# Patient Record
Sex: Female | Born: 1985 | Race: White | Hispanic: No | Marital: Married | Smoking: Never smoker
Health system: Southern US, Community
[De-identification: ages and names within clinical notes are randomized; demographics above are authoritative.]

## PROBLEM LIST (undated history)

## (undated) DIAGNOSIS — K319 Disease of stomach and duodenum, unspecified: Secondary | ICD-10-CM

## (undated) DIAGNOSIS — F32A Depression, unspecified: Secondary | ICD-10-CM

## (undated) DIAGNOSIS — Z803 Family history of malignant neoplasm of breast: Secondary | ICD-10-CM

## (undated) DIAGNOSIS — N83209 Unspecified ovarian cyst, unspecified side: Secondary | ICD-10-CM

## (undated) DIAGNOSIS — F419 Anxiety disorder, unspecified: Secondary | ICD-10-CM

## (undated) DIAGNOSIS — F329 Major depressive disorder, single episode, unspecified: Secondary | ICD-10-CM

## (undated) DIAGNOSIS — Z8042 Family history of malignant neoplasm of prostate: Secondary | ICD-10-CM

## (undated) DIAGNOSIS — N979 Female infertility, unspecified: Secondary | ICD-10-CM

## (undated) DIAGNOSIS — E282 Polycystic ovarian syndrome: Secondary | ICD-10-CM

## (undated) DIAGNOSIS — G43909 Migraine, unspecified, not intractable, without status migrainosus: Secondary | ICD-10-CM

## (undated) HISTORY — PX: GALLBLADDER SURGERY: SHX652

## (undated) HISTORY — DX: Family history of malignant neoplasm of prostate: Z80.42

## (undated) HISTORY — DX: Major depressive disorder, single episode, unspecified: F32.9

## (undated) HISTORY — DX: Family history of malignant neoplasm of breast: Z80.3

## (undated) HISTORY — PX: WISDOM TOOTH EXTRACTION: SHX21

## (undated) HISTORY — DX: Female infertility, unspecified: N97.9

## (undated) HISTORY — DX: Disease of stomach and duodenum, unspecified: K31.9

## (undated) HISTORY — DX: Depression, unspecified: F32.A

## (undated) HISTORY — DX: Migraine, unspecified, not intractable, without status migrainosus: G43.909

---
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
## 2006-01-12 ENCOUNTER — Emergency Department (HOSPITAL_COMMUNITY): Admission: EM | Admit: 2006-01-12 | Discharge: 2006-01-12 | Payer: Self-pay | Admitting: Emergency Medicine

## 2006-02-23 ENCOUNTER — Encounter: Admission: RE | Admit: 2006-02-23 | Discharge: 2006-02-23 | Payer: Self-pay | Admitting: Gastroenterology

## 2006-03-06 ENCOUNTER — Encounter: Admission: RE | Admit: 2006-03-06 | Discharge: 2006-03-06 | Payer: Self-pay | Admitting: Gastroenterology

## 2006-04-26 ENCOUNTER — Encounter: Admission: RE | Admit: 2006-04-26 | Discharge: 2006-05-15 | Payer: Self-pay | Admitting: Family Medicine

## 2006-08-24 ENCOUNTER — Other Ambulatory Visit: Admission: RE | Admit: 2006-08-24 | Discharge: 2006-08-24 | Payer: Self-pay | Admitting: Obstetrics and Gynecology

## 2007-09-20 ENCOUNTER — Other Ambulatory Visit: Admission: RE | Admit: 2007-09-20 | Discharge: 2007-09-20 | Payer: Self-pay | Admitting: Obstetrics and Gynecology

## 2007-11-01 ENCOUNTER — Emergency Department (HOSPITAL_COMMUNITY): Admission: EM | Admit: 2007-11-01 | Discharge: 2007-11-02 | Payer: Self-pay | Admitting: Emergency Medicine

## 2007-11-02 ENCOUNTER — Encounter (INDEPENDENT_AMBULATORY_CARE_PROVIDER_SITE_OTHER): Payer: Self-pay | Admitting: *Deleted

## 2007-12-18 ENCOUNTER — Encounter (INDEPENDENT_AMBULATORY_CARE_PROVIDER_SITE_OTHER): Payer: Self-pay | Admitting: General Surgery

## 2007-12-18 ENCOUNTER — Ambulatory Visit (HOSPITAL_COMMUNITY): Admission: RE | Admit: 2007-12-18 | Discharge: 2007-12-18 | Payer: Self-pay | Admitting: General Surgery

## 2008-10-13 ENCOUNTER — Other Ambulatory Visit: Admission: RE | Admit: 2008-10-13 | Discharge: 2008-10-13 | Payer: Self-pay | Admitting: Obstetrics and Gynecology

## 2009-08-07 HISTORY — PX: UPPER GI ENDOSCOPY: SHX6162

## 2009-08-07 HISTORY — PX: COLONOSCOPY: SHX174

## 2010-06-01 ENCOUNTER — Encounter (INDEPENDENT_AMBULATORY_CARE_PROVIDER_SITE_OTHER): Payer: Self-pay | Admitting: *Deleted

## 2010-06-07 ENCOUNTER — Ambulatory Visit: Payer: Self-pay | Admitting: Gastroenterology

## 2010-06-07 ENCOUNTER — Encounter (INDEPENDENT_AMBULATORY_CARE_PROVIDER_SITE_OTHER): Payer: Self-pay | Admitting: *Deleted

## 2010-06-07 DIAGNOSIS — R197 Diarrhea, unspecified: Secondary | ICD-10-CM | POA: Insufficient documentation

## 2010-06-07 DIAGNOSIS — R112 Nausea with vomiting, unspecified: Secondary | ICD-10-CM | POA: Insufficient documentation

## 2010-06-07 LAB — HM COLONOSCOPY: HM Colonoscopy: NORMAL

## 2010-06-09 LAB — CONVERTED CEMR LAB
ALT: 21 units/L (ref 0–35)
AST: 23 units/L (ref 0–37)
Albumin: 3.8 g/dL (ref 3.5–5.2)
Alkaline Phosphatase: 67 units/L (ref 39–117)
BUN: 16 mg/dL (ref 6–23)
Basophils Absolute: 0 10*3/uL (ref 0.0–0.1)
Basophils Relative: 0.1 % (ref 0.0–3.0)
CO2: 31 meq/L (ref 19–32)
Calcium: 9.7 mg/dL (ref 8.4–10.5)
Chloride: 104 meq/L (ref 96–112)
Creatinine, Ser: 0.8 mg/dL (ref 0.4–1.2)
Eosinophils Absolute: 0.1 10*3/uL (ref 0.0–0.7)
Eosinophils Relative: 1 % (ref 0.0–5.0)
GFR calc non Af Amer: 93.22 mL/min (ref >60)
Glucose, Bld: 128 mg/dL — ABNORMAL HIGH (ref 70–99)
HCT: 38.6 % (ref 36.0–46.0)
Hemoglobin: 13.2 g/dL (ref 12.0–15.0)
Lymphocytes Relative: 33.3 % (ref 12.0–46.0)
Lymphs Abs: 4.2 10*3/uL — ABNORMAL HIGH (ref 0.7–4.0)
MCHC: 34.2 g/dL (ref 30.0–36.0)
MCV: 83.7 fL (ref 78.0–100.0)
Monocytes Absolute: 0.6 10*3/uL (ref 0.1–1.0)
Monocytes Relative: 4.7 % (ref 3.0–12.0)
Neutro Abs: 7.6 10*3/uL (ref 1.4–7.7)
Neutrophils Relative %: 60.9 % (ref 43.0–77.0)
Platelets: 304 10*3/uL (ref 150.0–400.0)
Potassium: 4.8 meq/L (ref 3.5–5.1)
RBC: 4.61 M/uL (ref 3.87–5.11)
RDW: 13.8 % (ref 11.5–14.6)
Sed Rate: 33 mm/hr — ABNORMAL HIGH (ref 0–22)
Sodium: 143 meq/L (ref 135–145)
Total Bilirubin: 0.7 mg/dL (ref 0.3–1.2)
Total Protein: 7.2 g/dL (ref 6.0–8.3)
WBC: 12.4 10*3/uL — ABNORMAL HIGH (ref 4.5–10.5)

## 2010-07-11 ENCOUNTER — Ambulatory Visit: Payer: Self-pay | Admitting: Gastroenterology

## 2010-08-07 NOTE — L&D Delivery Note (Signed)
Delivery Note At 4:57 AM a viable and healthy female was delivered via Vaginal, Spontaneous Delivery (Presentation: Left Occiput Anterior).  APGAR: 8, 9; weight 8 lb 6 oz (3799 g).   Placenta status: , Spontaneous.  Cord: 3 vessels with a loose nuchal  Anesthesia: Epidural  Episiotomy: Median, 2nd degree midline epis Lacerations:  Suture Repair: 3.0 vicryl Est. Blood Loss (mL): 250 cc  Mom to postpartum.  Baby to nursery-stable.  Natilee Gauer H. 07/17/2011, 5:25 AM

## 2010-08-26 ENCOUNTER — Telehealth: Payer: Self-pay | Admitting: Gastroenterology

## 2010-08-28 ENCOUNTER — Encounter: Payer: Self-pay | Admitting: Gastroenterology

## 2010-09-06 ENCOUNTER — Ambulatory Visit
Admission: RE | Admit: 2010-09-06 | Discharge: 2010-09-06 | Payer: Self-pay | Source: Home / Self Care | Attending: Gastroenterology | Admitting: Gastroenterology

## 2010-09-08 NOTE — Assessment & Plan Note (Signed)
History of Present Illness Visit Type: Initial Visit Primary GI MD: Rob Bunting MD Primary Satine Hausner: Selena Batten, MD Chief Complaint: N&V History of Present Illness:      very pleasant 25 year old woman who is here with her husband today.  who has had stomach problems all her life.  Has had numerous tests.  Previously seen by Dr. Randa Evens.  Since June 2010 she has been having vomiting 1-4 times a week in the AM usually.  No clear food causes this.  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 I have records from Dr. Randa Evens showing a normal HIDA scan found an abdominal ultrasound that suggested some abnormal, possibly polyps in her gallbladder, and a normal CT scan of the abdomen. These were all done in 2007  Her GB was removed surgically laparoscopically for gallsones in 2009.  She has loose bowels, urgency since June 5-7 days a week. She has cramping at night usually.  No bleeding.    She gets pyrosis less than once a week.  She tried PPI without any changes in her symptoms.  She has intermittent dysphagia.  One time had food impaction, had to vomit it up.  she has gained 30-40 pounds in the past year.           Current Medications (verified): 1)  Multivitamins  Tabs (Multiple Vitamin) .... Once Daily 2)  Fluoxetine Hcl 20 Mg Caps (Fluoxetine Hcl) .... Once Daily  Allergies (verified): 1)  ! 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  Review of Systems       Pertinent positive and negative review of systems were noted in the above HPI and GI specific review of systems.  All other review of systems was otherwise negative.   Vital Signs:  Patient  profile:   25 year old female Height:      64 inches Weight:      258.13 pounds BMI:     44.47 Pulse rate:   80 / minute Pulse rhythm:   regular BP sitting:   110 / 80  (left arm) Cuff size:   regular  Vitals Entered By: June McMurray CMA Duncan Dull) (June 07, 2010 1:55 PM)  Physical Exam  Additional Exam:  Constitutional: Obese, otherwise generally well appearing Psychiatric: alert and oriented times 3 Eyes: extraocular movements intact Mouth: oropharynx moist, no lesions Neck: supple, no lymphadenopathy Cardiovascular: heart regular rate and rythm Lungs: CTA bilaterally Abdomen: soft, non-tender, non-distended, no obvious ascites, no peritoneal signs, normal bowel sounds Extremities: no lower extremity edema bilaterally Skin: no lesions on visible extremities    Impression & Recommendations:  Problem # 1:  nausea, vomiting, intermittent dysphasia she takes quite a lot of NSAIDs, I did not mention this above but she does take 4-5 ibuprofen at least 3-5 times a week. Perhaps she has underlying peptic ulcer disease, gastritis, H. pylori. We will proceed with EGD at her soonest convenience. she also has intermittent solid foodysphagia I will evaluate.  she will begin over-the-counter Prilosec once daily.  Problem # 2:  loose stools chronically, urgency possibly from bile salt dysregulation from her gallbladder being removed. Given the chronicity of her symptoms I think we should proceed with colonoscopy as well at  the same time as her upper endoscopy.   in the meantime she will try one Imodium a day.  Other Orders: TLB-CBC Platelet - w/Differential (85025-CBCD) TLB-CMP (Comprehensive Metabolic Pnl) (80053-COMP) TLB-Sedimentation Rate (ESR) (85652-ESR)  Patient Instructions: 1)  Start OTC prilosec 20-30 min before dinner meal daily. 2)  You will be scheduled to have an upper endoscopy. 3)  You will be scheduled to have a colonoscopy. 4)  Try to cut back on NSAIDs.  These meds  can signficantly irritate your stomach. 5)  Trial of one immodium pill every morning. 6)  A copy of this information will be sent to Dr. Chari Manning. 7)  You will get lab test(s) done today (cbc, cmet, esr) 8)  The medication list was reviewed and reconciled.  All changed / newly prescribed medications were explained.  A complete medication list was provided to the patient / caregiver.  Appended Document: Orders Update/Movi    Clinical Lists Changes  Problems: Added new problem of DIARRHEA (ICD-787.91) Medications: Added new medication of MOVIPREP 100 GM  SOLR (PEG-KCL-NACL-NASULF-NA ASC-C) As per prep instructions. - Signed Rx of MOVIPREP 100 GM  SOLR (PEG-KCL-NACL-NASULF-NA ASC-C) As per prep instructions.;  #1 x 0;  Signed;  Entered by: Chales Abrahams CMA (AAMA);  Authorized by: Rachael Fee MD;  Method used: Electronically to [facility] [address], [place], [place]  [address], Ph: [phone], Fax: [phone] Orders: Added new Test order of Colon/Endo (Colon/Endo) - Signed    Prescriptions: MOVIPREP 100 GM  SOLR (PEG-KCL-NACL-NASULF-NA ASC-C) As per prep instructions.  #1 x 0   Entered by:   Chales Abrahams CMA (AAMA)   Authorized by:   Rachael Fee MD   Signed by:   Chales Abrahams CMA (AAMA) on 06/07/2010   Method used:   Electronically to        [facility] [address]* (retail)       [address]       [place], [place]  [address]       Ph: [phone]       Fax: [phone]   RxID:   [id number]

## 2010-09-08 NOTE — Letter (Signed)
Summary: [facility] Instructions  [facility]  [address] [place], [place] [address]   Phone: [phone]  Fax: [phone]       [name]    1985/09/15    MRN: [id number]        Procedure Day /Date:07/11/10 MON     Arrival Time:2 pm     Procedure Time:3 pm     Location of Procedure:                    X  [facility] (4th Floor)   PREPARATION FOR COLONOSCOPY WITH MOVIPREP   Starting 5 days prior to your procedure 07/06/10 do not eat nuts, seeds, popcorn, corn, beans, peas,  salads, or any raw vegetables.  Do not take any fiber supplements (e.g. Metamucil, Citrucel, and Benefiber).  THE DAY BEFORE YOUR PROCEDURE         DATE: 07/10/10 DAY: SUN  1.  Drink clear liquids the entire day-NO SOLID FOOD  2.  Do not drink anything colored red or purple.  Avoid juices with pulp.  No orange juice.  3.  Drink at least 64 oz. (8 glasses) of fluid/clear liquids during the day to prevent dehydration and help the prep work efficiently.  CLEAR LIQUIDS INCLUDE: Water Jello Ice Popsicles Tea (sugar ok, no milk/cream) Powdered fruit flavored drinks Coffee (sugar ok, no milk/cream) Gatorade Juice: apple, white grape, white cranberry  Lemonade Clear bullion, consomm, broth Carbonated beverages (any kind) Strained chicken noodle soup Hard Candy                             4.  In the morning, mix first dose of MoviPrep solution:    Empty 1 Pouch A and 1 Pouch B into the disposable container    Add lukewarm drinking water to the top line of the container. Mix to dissolve    Refrigerate (mixed solution should be used within 24 hrs)  5.  Begin drinking the prep at 5:00 p.m. The MoviPrep container is divided by 4 marks.   Every 15 minutes drink the solution down to the next mark (approximately 8 oz) until the full liter is complete.   6.  Follow completed prep with 16 oz of clear liquid of your choice (Nothing red or purple).  Continue to drink clear  liquids until bedtime.  7.  Before going to bed, mix second dose of MoviPrep solution:    Empty 1 Pouch A and 1 Pouch B into the disposable container    Add lukewarm drinking water to the top line of the container. Mix to dissolve    Refrigerate  THE DAY OF YOUR PROCEDURE      DATE: 07/11/10 DAY: MON  Beginning at 10 a.m. (5 hours before procedure):         1. Every 15 minutes, drink the solution down to the next mark (approx 8 oz) until the full liter is complete.  2. Follow completed prep with 16 oz. of clear liquid of your choice.    3. You may drink clear liquids until 1 pm  (2 HOURS BEFORE PROCEDURE).   MEDICATION INSTRUCTIONS  Unless otherwise instructed, you should take regular prescription medications with a small sip of water   as early as possible the morning of your procedure.         OTHER INSTRUCTIONS  You will need a responsible adult at least 26 years of age  to accompany you and drive you home.   This person must remain in the waiting room during your procedure.  Wear loose fitting clothing that is easily removed.  Leave jewelry and other valuables at home.  However, you may wish to bring a book to read or  an iPod/MP3 player to listen to music as you wait for your procedure to start.  Remove all body piercing jewelry and leave at home.  Total time from sign-in until discharge is approximately 2-3 hours.  You should go home directly after your procedure and rest.  You can resume normal activities the  day after your procedure.  The day of your procedure you should not:   Drive   Make legal decisions   Operate machinery   Drink alcohol   Return to work  You will receive specific instructions about eating, activities and medications before you leave.    The above instructions have been reviewed and explained to me by   _______________________    I fully understand and can verbalize these instructions _____________________________ Date  _________

## 2010-09-08 NOTE — Procedures (Signed)
Summary: Colonoscopy  Patient: [name] Note: All result statuses are Final unless otherwise noted.  Tests: (1) Colonoscopy (COL)   COL Colonoscopy           DONE     [facility]     [address]     [place], [place]  [address]           COLONOSCOPY PROCEDURE REPORT           PATIENT:  Melinda, Thomas  MR#:  [id number]     BIRTHDATE:  10/24/1985, 24 yrs. old  GENDER:  female     ENDOSCOPIST:  Rachael Fee, MD     PROCEDURE DATE:  07/11/2010     PROCEDURE:  Colonoscopy with biopsy     ASA CLASS:  Class II     INDICATIONS:  urgency, intermittent loose stools; cramping     MEDICATIONS:   Fentanyl 50 mcg IV, Versed 8 mg IV           DESCRIPTION OF PROCEDURE:   After the risks benefits and     alternatives of the procedure were thoroughly explained, informed     consent was obtained.  Digital rectal exam was performed and     revealed no rectal masses.   The LB [id number]-[id number] endoscope     was introduced through the anus and advanced to the terminal ileum     which was intubated for a short distance, without limitations.     The quality of the prep was good, using MoviPrep.  The instrument     was then slowly withdrawn as the colon was fully examined.     <<PROCEDUREIMAGES>>     FINDINGS:  The terminal ileum appeared normal (see image2).  A     normal appearing cecum, ileocecal valve, and appendiceal orifice     were identified. The ascending, hepatic flexure, transverse,     splenic flexure, descending, sigmoid colon, and rectum appeared     unremarkable. Colon was randomly biopsied (path jar 1) (see     image1, image3, and image4).   Retroflexed views in the rectum     revealed no abnormalities.    The scope was then withdrawn from     the patient and the procedure completed.     COMPLICATIONS:  None           ENDOSCOPIC IMPRESSION:     1) Normal terminal ileum     2) Normal colon; randomly biopsied to check for microscopic     colitis        RECOMMENDATIONS:     Await results of random biopsies for final recommendations           ______________________________     Rachael Fee, MD           cc: Selena Batten, MD           n.     eSIGNED:   Rachael Fee at 07/11/2010 03:13 PM           [name], [id number]  Note: An exclamation mark (!) indicates a result that was not dispersed into the flowsheet. Document Creation Date: 07/11/2010 3:13 PM _______________________________________________________________________  (1) Order result status: Final Collection or observation date-time: 07/11/2010 15:08 Requested date-time:  Receipt date-time:  Reported date-time:  Referring Physician:   Ordering Physician: Rob Bunting [phone]) Specimen Source:  Source: Launa Grill Order Number: [id number] Lab site:

## 2010-09-08 NOTE — Procedures (Signed)
Summary: Upper Endoscopy  Patient: [name] Note: All result statuses are Final unless otherwise noted.  Tests: (1) Upper Endoscopy (EGD)   EGD Upper Endoscopy       DONE     [facility]     [address]     [place], [place]  [address]           ENDOSCOPY PROCEDURE REPORT           PATIENT:  [name], [name]  MR#:  [id number]     BIRTHDATE:  06-09-1986, 24 yrs. old  GENDER:  female     ENDOSCOPIST:  Rachael Fee, MD     PROCEDURE DATE:  07/11/2010     PROCEDURE:  EGD with balloon dilatation     ASA CLASS:  Class II     INDICATIONS:  intermittent vomiting, dysphagia     MEDICATIONS:  There was residual sedation effect present from     prior procedure., Fentanyl 50 mcg IV, Versed 4 mg IV     TOPICAL ANESTHETIC:  Exactacain Spray           DESCRIPTION OF PROCEDURE:   After the risks benefits and     alternatives of the procedure were thoroughly explained, informed     consent was obtained.  The LB GIF-H180 [id number] endoscope was     introduced through the mouth and advanced to the second portion of     the duodenum, without limitations.  The instrument was slowly     withdrawn as the mucosa was fully examined.     <<PROCEDUREIMAGES>>           A Schatzki's ring was found. This was dilated up to 20mm with CRE     TTS balloon held inflated for 60 seconds. There was usual     superficial mucosal tear and minor self limited bleeding following     dilation (see image2, image8, and image9).  Otherwise the     examination was normal (see image6, image5, image4, and image3).     Retroflexed views revealed no abnormalities.    The scope was then     withdrawn from the patient and the procedure completed.           COMPLICATIONS:  None           ENDOSCOPIC IMPRESSION:     1) Schatzki's ring, dilated up to 20mm with CRE balloon     2) Otherwise normal examination           RECOMMENDATIONS:     Continue to avoid NSAIDs as best as possible.        ______________________________     Rachael Fee, MD           cc: Selena Batten, MD           n.     Rosalie Doctor:   Rachael Fee at 07/11/2010 03:25 PM           [name], [id number]  Note: An exclamation mark (!) indicates a result that was not dispersed into the flowsheet. Document Creation Date: 07/11/2010 3:25 PM _______________________________________________________________________  (1) Order result status: Final Collection or observation date-time: 07/11/2010 15:20 Requested date-time:  Receipt date-time:  Reported date-time:  Referring Physician:   Ordering Physician: Rob Bunting [phone]) Specimen Source:  Source: Launa Grill Order Number: [id number] Lab site:

## 2010-09-08 NOTE — Letter (Signed)
Summary: New Patient letter  [facility]  [address] [place], [place] [address]   Phone: [phone]  Fax: [phone]       06/01/2010 MRN: [id number]  [name] [address] [place], [place]  [address]  Dear [name],  Welcome to the Gastroenterology Division at [facility].    You are scheduled to see Dr. Christella Hartigan on 06/07/2010 at 2:00PM on the 3rd floor at [facility], [address]  We ask that you try to arrive at our office 15 minutes prior to your appointment time to allow for check-in.  We would like you to complete the enclosed self-administered evaluation form prior to your visit and bring it with you on the day of your appointment.  We will review it with you.  Also, please bring a complete list of all your medications or, if you prefer, bring the medication bottles and we will list them.  Please bring your insurance card so that we may make a copy of it.  If your insurance requires a referral to see a specialist, please bring your referral form from your primary care physician.  Co-payments are due at the time of your visit and may be paid by cash, check or credit card.     Your office visit will consist of a consult with your physician (includes a physical exam), any laboratory testing he/she may order, scheduling of any necessary diagnostic testing (e.g. x-ray, ultrasound, CT-scan), and scheduling of a procedure (e.g. Endoscopy, Colonoscopy) if required.  Please allow enough time on your schedule to allow for any/all of these possibilities.    If you cannot keep your appointment, please call [phone] to cancel or reschedule prior to your appointment date.  This allows [place] the opportunity to schedule an appointment for another patient in need of care.  If you do not cancel or reschedule by 5 p.m. the business day prior to your appointment date, you will be charged a $50.00 late cancellation/no-show fee.    Thank you for  choosing [facility] for your medical needs.  We appreciate the opportunity to care for you.  Please visit [place] at our website  to learn more about our practice.                     Sincerely,                                                             The Gastroenterology Division

## 2010-09-08 NOTE — Progress Notes (Signed)
Summary: what is the next step?  Phone Note Call from Patient Call back at cell [phone] or [phone]   Call For: Dr Christella Hartigan Summary of Call: ha been waiting to hear back on what is the next step on her treatment. Initial call taken by: Leanor Kail [facility],  August 26, 2010 1:12 PM  Follow-up for Phone Call        Spoke with patient. She states that she is still having nausea and vomiting. The episodes are "random" per patient. She may go for 2 weeks with no problems then has vomiting for 3-4 days. She states the Zofran did not help and she is not taking it. No diarrhea but does have urgency sometimes. Per Dr. Christella Hartigan note patient should be scheduled for 6-8 week f/u. Patient scheduled for 09/06/10 at 2:15 PM.  Follow-up by: Jesse Fall RN,  August 26, 2010 2:38 PM  Additional Follow-up for Phone Call Additional follow up Details #1::        ok Additional Follow-up by: Rachael Fee MD,  August 26, 2010 2:42 PM

## 2010-09-14 NOTE — Assessment & Plan Note (Signed)
  Review of gastrointestinal problems: 1. Chronic abdominal discomforts, nausea:  Previously seen by a different gastroenterologist who did "exhaustive workup" including ultrasound, HIDA scan, celiac sprue testing, multiple blood tests. EGD December 2011 essentially normal Colonoscopy December 2011, normal including normal terminal ileum and normal random colon biopsies    History of Present Illness Visit Type: Follow-up Visit Primary GI MD: Rob Bunting MD Primary Provider: Selena Batten, MD Chief Complaint: Nausea & cramping History of Present Illness:     pleasant 25 year old woman whom I saw last saw 2 months ago.  Dysphagia improved but can still happen occasionally.   Her loose bowels, diarrhea have improved.  SHe is not sure why they are improving.    Still bothered by nausea, crampy aches.    nausea goes in spurts.  zofran didn't help.  Antiacid meds dont help.  pain is constant, all throughout abd.  BMs don't alter it.  she really cut back on NSAIDs, taking almost none now.             Current Medications (verified): 1)  Multivitamins  Tabs (Multiple Vitamin) .... Once Daily 2)  Fluoxetine Hcl 20 Mg Caps (Fluoxetine Hcl) .... Once Daily 3)  Provera 10 Mg Tabs (Medroxyprogesterone Acetate) .... Take 5 Days Monthly 4)  Clomid 50 Mg Tabs (Clomiphene Citrate) .... Take For 5 Days Per Month  Allergies (verified): 1)  ! Penicillin  Vital Signs:  Patient profile:   25 year old female Height:      64 inches Weight:      259.25 pounds BMI:     44.66 Pulse rate:   64 / minute Pulse rhythm:   regular BP sitting:   110 / 76  (left arm) Cuff size:   large  Vitals Entered By: June McMurray CMA Duncan Dull) (September 06, 2010 2:51 PM)  Physical Exam  Additional Exam:  Constitutional: Obese, otherwise generally well appearing Psychiatric: alert and oriented times 3 Abdomen: soft, non-tender, non-distended, normal bowel sounds    Impression &  Recommendations:  Problem # 1:  chronic nausea, chronic abdominal pain #1 side effect of her Prozac is nausea. This was started for "intestinal migraines". I'm not sure of that diagnosis but I am sure that this medicine can be contributing or potentially causing her nausea. I'm going to have her taper off it and stopped completely in about 6 weeks and she'll return to see me in 3 months.  Patient Instructions: 1)  Unclear cause of GI symptoms. 2)  Stop your prozac, the number one side effect is nausea.  Taper off this over about two weeks.    Will call you in 10mg  pills, take one daily for 3 weeks, then one pill every other day for 3 weeks, then stop completely. 3)  Return to see Dr. Christella Hartigan in 3 months. 4)  The medication list was reviewed and reconciled.  All changed / newly prescribed medications were explained.  A complete medication list was provided to the patient / caregiver. Prescriptions: FLUOXETINE HCL 10 MG CAPS (FLUOXETINE HCL) once daily for three weeks, then once every other day, then stop  #40 x 0   Entered and Authorized by:   Rachael Fee MD   Signed by:   Rachael Fee MD on 09/06/2010   Method used:   Print then Give to Patient   RxID:   [id number]

## 2010-12-09 ENCOUNTER — Other Ambulatory Visit: Payer: Self-pay | Admitting: Obstetrics and Gynecology

## 2010-12-20 NOTE — Op Note (Signed)
[name], [name]              ACCOUNT NO.:  [id number]   MEDICAL RECORD NO.:  [id number]          PATIENT TYPE:  AMB   LOCATION:  DAY                          FACILITY:  [facility]   PHYSICIAN:  Lennie Muckle, MD      DATE OF BIRTH:  April 12, 1986   DATE OF PROCEDURE:  12/18/2007  DATE OF DISCHARGE:                               OPERATIVE REPORT   PREOPERATIVE DIAGNOSIS:  Symptomatic cholelithiasis.   POSTOPERATIVE DIAGNOSIS:  Symptomatic cholelithiasis.   PROCEDURE:  Laparoscopic cholecystectomy.   SURGEON:  Lennie Muckle, MD   ASSISTANT:  Ollen Gross. Vernell Morgans, M.D.   ANESTHESIA:  General endotracheal anesthesia.   FINDINGS:  Adhesions of the gallbladder to the duodenum.   SPECIMEN:  Gallbladder.   COMPLICATIONS:  No immediate complications.   DRAINS:  No drains were placed.   ESTIMATED BLOOD LOSS:  Minimal amount of blood loss.   INDICATIONS FOR PROCEDURE:  [name] is a 25 year old female who had  multiple episodes of epigastric right upper quadrant discomfort.  She  had had an ultrasound performed which revealed multiple gallstones.  Symptoms were consistent with a symptomatic cholelithiasis.   DETAILS OF PROCEDURE:  [name] was identified in the preoperative  holding suite.  She was given a gram of cefoxitin and taken to the  operating room.  Once in the operating room, placed in the supine  position.  After administration of general endotracheal anesthesia, her  abdomen was prepped and draped in usual sterile fashion.  A time-out  procedure indicating the patient and procedure were performed.  An  incision was placed at the umbilical region.  The fascia was grasped  with Kocher forceps.  A Veress needle introduced into the abdominal  cavity for pneumoinsufflation.  After adequate pneumoinsufflation, a #11  mm trocar was placed using the OptiVu.  There was no evidence of injury  upon placement of the trocar or the Veress needle.  Three additional 5-  mm trocars were  placed under visualization with the camera.  One was  placed at the gastric region, 2 on the right side of the abdomen.  The  gallbladder was grasped at the fundus and retracted to the head of the  patient.  The infundibulum was grasped away from the liver bed.  There  were a small amount of adhesions at the infundibulum which were to the  duodenum.  These were sharply dissected with laparoscopic scissors.  Using careful dissection with Maryland forceps and the hook  electrocautery, I divided the peritoneum at the infundibulum.  I was  able to isolate the cystic artery which was anterior to the cystic duct.  I dissected posterior to the cystic duct and artery to obtain a critical  view of the cystic artery and duct on the liver bed.  I placed 2 clips  proximally on the cystic artery, 1 distally and transected with  laparoscopic scissors gaining better exposure to the cystic duct.  Three  clips were placed proximally and 1 distally.  This was transected with  laparoscopic scissors.  The remaining peritoneal attachments were  dissected with  a hook electrocautery.  The small amount of bile spillage  during removal of the gallbladder.  The abdomen was irrigated with a 1.5  L of saline.  There was a small amount of oozing at the liver bed which  was corrected with electrocautery.  Final irrigation and inspection  revealed no bleeding from the liver bed.  The irrigant was cleaned.  The  umbilical incision was closed with 0-0 Vicryl suture using a  laparoscopic suture passer.  Final inspection of the abdominal cavity  revealed no intra-abdominal injury and no bleeding.  Pneumoinsufflation  was then released.  Skin was closed with 4-0 Monocryl.  Dermabond placed  for final dressing.  The patient was extubated, transported to post  anesthesia care unit in stable condition.  She will be discharged home  today if her pain is well controlled and tolerating a liquid diet.      Lennie Muckle, MD   Electronically Signed     ALA/MEDQ  D:  12/18/2007  T:  12/18/2007  Job:  [id number]   cc:   Pam Drown, M.D.  Fax: [phone]

## 2011-01-03 ENCOUNTER — Inpatient Hospital Stay (HOSPITAL_COMMUNITY)
Admission: AD | Admit: 2011-01-03 | Discharge: 2011-01-03 | Disposition: A | Discharge status: Home / Self Care | Payer: Managed Care, Other (non HMO) | Source: Ambulatory Visit | Attending: Obstetrics and Gynecology | Admitting: Obstetrics and Gynecology

## 2011-01-03 DIAGNOSIS — O9989 Other specified diseases and conditions complicating pregnancy, childbirth and the puerperium: Secondary | ICD-10-CM

## 2011-01-03 DIAGNOSIS — O99891 Other specified diseases and conditions complicating pregnancy: Secondary | ICD-10-CM

## 2011-01-03 DIAGNOSIS — K5900 Constipation, unspecified: Secondary | ICD-10-CM | POA: Insufficient documentation

## 2011-01-03 LAB — URINALYSIS, ROUTINE W REFLEX MICROSCOPIC
Bilirubin Urine: NEGATIVE
Ketones, ur: 15 mg/dL — AB
Nitrite: NEGATIVE
pH: 7 (ref 5.0–8.0)

## 2011-01-03 LAB — HEPATITIS B SURFACE ANTIGEN: Hepatitis B Surface Ag: NEGATIVE

## 2011-01-03 LAB — ABO/RH: RH Type: POSITIVE

## 2011-05-01 LAB — CBC
HCT: 38.8
Hemoglobin: 13.2
WBC: 14.7 — ABNORMAL HIGH

## 2011-05-01 LAB — COMPREHENSIVE METABOLIC PANEL
ALT: 19
Albumin: 3.5
Alkaline Phosphatase: 41
BUN: 9
Chloride: 109
Glucose, Bld: 116 — ABNORMAL HIGH
Potassium: 4
Total Bilirubin: 0.5

## 2011-05-01 LAB — DIFFERENTIAL
Basophils Absolute: 0.1
Basophils Relative: 1
Eosinophils Absolute: 0.2
Monocytes Absolute: 0.8
Neutro Abs: 9.2 — ABNORMAL HIGH
Neutrophils Relative %: 62

## 2011-05-01 LAB — URINALYSIS, ROUTINE W REFLEX MICROSCOPIC
Bilirubin Urine: NEGATIVE
Glucose, UA: NEGATIVE
Hgb urine dipstick: NEGATIVE
Ketones, ur: NEGATIVE
pH: 7

## 2011-05-01 LAB — URINE MICROSCOPIC-ADD ON

## 2011-05-26 ENCOUNTER — Institutional Professional Consult (permissible substitution): Payer: Managed Care, Other (non HMO) | Admitting: Pediatrics

## 2011-07-16 ENCOUNTER — Inpatient Hospital Stay (HOSPITAL_COMMUNITY)
Admission: AD | Admit: 2011-07-16 | Discharge: 2011-07-19 | DRG: 775 | Disposition: A | Discharge status: Home / Self Care | Payer: Managed Care, Other (non HMO) | Source: Ambulatory Visit | Attending: Obstetrics and Gynecology | Admitting: Obstetrics and Gynecology

## 2011-07-16 ENCOUNTER — Inpatient Hospital Stay (HOSPITAL_COMMUNITY): Payer: Managed Care, Other (non HMO) | Admitting: Anesthesiology

## 2011-07-16 ENCOUNTER — Encounter (HOSPITAL_COMMUNITY): Payer: Self-pay | Admitting: *Deleted

## 2011-07-16 ENCOUNTER — Encounter (HOSPITAL_COMMUNITY): Payer: Self-pay | Admitting: Anesthesiology

## 2011-07-16 DIAGNOSIS — E669 Obesity, unspecified: Secondary | ICD-10-CM | POA: Diagnosis present

## 2011-07-16 DIAGNOSIS — Z3480 Encounter for supervision of other normal pregnancy, unspecified trimester: Secondary | ICD-10-CM

## 2011-07-16 HISTORY — DX: Polycystic ovarian syndrome: E28.2

## 2011-07-16 LAB — CBC
MCHC: 33.7 g/dL (ref 30.0–36.0)
MCV: 81.3 fL (ref 78.0–100.0)
Platelets: 229 10*3/uL (ref 150–400)
RDW: 14.7 % (ref 11.5–15.5)
WBC: 18.1 10*3/uL — ABNORMAL HIGH (ref 4.0–10.5)

## 2011-07-16 LAB — COMPREHENSIVE METABOLIC PANEL
AST: 14 U/L (ref 0–37)
CO2: 19 mEq/L (ref 19–32)
Calcium: 9.4 mg/dL (ref 8.4–10.5)
Creatinine, Ser: 0.53 mg/dL (ref 0.50–1.10)
GFR calc Af Amer: >90 mL/min (ref >90)
GFR calc non Af Amer: >90 mL/min (ref >90)
Glucose, Bld: 104 mg/dL — ABNORMAL HIGH (ref 70–99)

## 2011-07-16 LAB — POCT FERN TEST: Fern Test: NEGATIVE

## 2011-07-16 MED ORDER — CITRIC ACID-SODIUM CITRATE 334-500 MG/5ML PO SOLN: 30.0000 mL | ORAL | Status: DC | PRN

## 2011-07-16 MED ORDER — LIDOCAINE HCL (PF) 1 % IJ SOLN: 30.0000 mL | INTRAMUSCULAR | Status: DC | PRN

## 2011-07-16 MED ORDER — LIDOCAINE HCL (PF) 1 % IJ SOLN
30.0000 mL | INTRAMUSCULAR | Status: DC | PRN
  Administered 2011-07-17: 30 mL via SUBCUTANEOUS
  Filled 2011-07-16: qty 30

## 2011-07-16 MED ORDER — ONDANSETRON HCL 4 MG/2ML IJ SOLN: 4.0000 mg | Freq: Four times a day (QID) | INTRAMUSCULAR | Status: DC | PRN

## 2011-07-16 MED ORDER — BUTORPHANOL TARTRATE 2 MG/ML IJ SOLN: 1.0000 mg | INTRAMUSCULAR | Status: DC | PRN

## 2011-07-16 MED ORDER — LORAZEPAM 2 MG/ML IJ SOLN: 1.0000 mg | INTRAMUSCULAR | Status: DC | PRN

## 2011-07-16 MED ORDER — ACETAMINOPHEN 325 MG PO TABS: 650.0000 mg | ORAL_TABLET | ORAL | Status: DC | PRN

## 2011-07-16 MED ORDER — LACTATED RINGERS IV SOLN: 500.0000 mL | INTRAVENOUS | Status: DC | PRN

## 2011-07-16 MED ORDER — OXYTOCIN BOLUS FROM INFUSION
500.0000 mL | Freq: Once | INTRAVENOUS | Status: DC
  Filled 2011-07-16: qty 1000
  Filled 2011-07-16: qty 500

## 2011-07-16 MED ORDER — LACTATED RINGERS IV SOLN
500.0000 mL | Freq: Once | INTRAVENOUS | Status: AC
  Administered 2011-07-16: 500 mL via INTRAVENOUS

## 2011-07-16 MED ORDER — FLEET ENEMA 7-19 GM/118ML RE ENEM: 1.0000 | ENEMA | RECTAL | Status: DC | PRN

## 2011-07-16 MED ORDER — OXYTOCIN 20 UNITS IN LACTATED RINGERS INFUSION - SIMPLE: 125.0000 mL/h | Freq: Once | INTRAVENOUS | Status: DC

## 2011-07-16 MED ORDER — OXYTOCIN BOLUS FROM INFUSION
500.0000 mL | Freq: Once | INTRAVENOUS | Status: DC
  Filled 2011-07-16: qty 500

## 2011-07-16 MED ORDER — ZOLPIDEM TARTRATE 10 MG PO TABS: 10.0000 mg | ORAL_TABLET | Freq: Once | ORAL | Status: DC

## 2011-07-16 MED ORDER — PHENYLEPHRINE 40 MCG/ML (10ML) SYRINGE FOR IV PUSH (FOR BLOOD PRESSURE SUPPORT): 80.0000 ug | PREFILLED_SYRINGE | INTRAVENOUS | Status: DC | PRN

## 2011-07-16 MED ORDER — FENTANYL 2.5 MCG/ML BUPIVACAINE 1/10 % EPIDURAL INFUSION (WH - ANES)
14.0000 mL/h | INTRAMUSCULAR | Status: DC
  Administered 2011-07-16 – 2011-07-17 (×5): 14 mL/h via EPIDURAL
  Filled 2011-07-16 (×8): qty 60

## 2011-07-16 MED ORDER — OXYTOCIN 20 UNITS IN LACTATED RINGERS INFUSION - SIMPLE: 1.0000 m[IU]/min | INTRAVENOUS | Status: DC

## 2011-07-16 MED ORDER — PHENAZOPYRIDINE HCL 200 MG PO TABS
200.0000 mg | ORAL_TABLET | Freq: Three times a day (TID) | ORAL | Status: DC
  Filled 2011-07-16 (×2): qty 1

## 2011-07-16 MED ORDER — FENTANYL 2.5 MCG/ML BUPIVACAINE 1/10 % EPIDURAL INFUSION (WH - ANES)
INTRAMUSCULAR | Status: DC | PRN
  Administered 2011-07-16: 14 mL/h via EPIDURAL

## 2011-07-16 MED ORDER — EPHEDRINE 5 MG/ML INJ
10.0000 mg | INTRAVENOUS | Status: DC | PRN
  Filled 2011-07-16: qty 4

## 2011-07-16 MED ORDER — OXYCODONE-ACETAMINOPHEN 5-325 MG PO TABS: 2.0000 | ORAL_TABLET | ORAL | Status: DC | PRN

## 2011-07-16 MED ORDER — TERBUTALINE SULFATE 1 MG/ML IJ SOLN: 0.2500 mg | Freq: Once | INTRAMUSCULAR | Status: AC | PRN

## 2011-07-16 MED ORDER — DIPHENHYDRAMINE HCL 50 MG/ML IJ SOLN: 12.5000 mg | INTRAMUSCULAR | Status: DC | PRN

## 2011-07-16 MED ORDER — LIDOCAINE HCL 1.5 % IJ SOLN
INTRAMUSCULAR | Status: DC | PRN
  Administered 2011-07-16 (×2): 5 mL via EPIDURAL

## 2011-07-16 MED ORDER — NALBUPHINE HCL 10 MG/ML IJ SOLN
10.0000 mg | Freq: Once | INTRAMUSCULAR | Status: AC
  Administered 2011-07-16: 10 mg via INTRAMUSCULAR
  Filled 2011-07-16: qty 1

## 2011-07-16 MED ORDER — IBUPROFEN 600 MG PO TABS: 600.0000 mg | ORAL_TABLET | Freq: Four times a day (QID) | ORAL | Status: DC | PRN

## 2011-07-16 MED ORDER — LACTATED RINGERS IV SOLN
INTRAVENOUS | Status: DC
  Administered 2011-07-16: 500 mL via INTRAVENOUS
  Administered 2011-07-16: 125 mL/h via INTRAVENOUS
  Administered 2011-07-16: 20:00:00 via INTRAVENOUS

## 2011-07-16 MED ORDER — ONDANSETRON HCL 4 MG/2ML IJ SOLN
4.0000 mg | Freq: Four times a day (QID) | INTRAMUSCULAR | Status: DC | PRN
  Administered 2011-07-16 – 2011-07-17 (×2): 4 mg via INTRAVENOUS
  Filled 2011-07-16 (×2): qty 2

## 2011-07-16 MED ORDER — SODIUM BICARBONATE 8.4 % IV SOLN
INTRAVENOUS | Status: DC | PRN
  Administered 2011-07-16: 4 mL via EPIDURAL

## 2011-07-16 MED ORDER — BUPIVACAINE HCL (PF) 0.25 % IJ SOLN
INTRAMUSCULAR | Status: DC | PRN
  Administered 2011-07-16: 10 mL via EPIDURAL

## 2011-07-16 MED ORDER — LACTATED RINGERS IV SOLN
INTRAVENOUS | Status: DC
  Administered 2011-07-16: 125 mL/h via INTRAVENOUS

## 2011-07-16 MED ORDER — PHENYLEPHRINE 40 MCG/ML (10ML) SYRINGE FOR IV PUSH (FOR BLOOD PRESSURE SUPPORT)
80.0000 ug | PREFILLED_SYRINGE | INTRAVENOUS | Status: DC | PRN
  Filled 2011-07-16 (×2): qty 5

## 2011-07-16 MED ORDER — ZOLPIDEM TARTRATE 10 MG PO TABS: 10.0000 mg | ORAL_TABLET | Freq: Every evening | ORAL | Status: DC | PRN

## 2011-07-16 MED ORDER — OXYCODONE-ACETAMINOPHEN 5-325 MG PO TABS
2.0000 | ORAL_TABLET | Freq: Once | ORAL | Status: AC
  Administered 2011-07-16: 2 via ORAL
  Filled 2011-07-16: qty 2

## 2011-07-16 NOTE — Initial Assessments (Signed)
Patient reporting pain relief after bolus intervention and repositioning. Still has pain on left side, bolus given. Counter pressure to back provides some relief.

## 2011-07-16 NOTE — Progress Notes (Signed)
Pt states, " My contractions started at 10:24 pm, and now they are every 2-3 minutes."

## 2011-07-16 NOTE — ED Notes (Signed)
Dr Tenny Craw notified of pt's repeat sve and pt's discomfort with ctxs. Will try Nubain 10mg  IM x1

## 2011-07-16 NOTE — Progress Notes (Signed)
Pt up to BR

## 2011-07-16 NOTE — Progress Notes (Signed)
Pt 's perineum wet and pt does not think is perspiration.

## 2011-07-16 NOTE — H&P (Signed)
[name] is a 25 y.o. female G1P001 @ 40+0 presenting for contractions Pt presented overnight for painful contractions.  Her cervix did not change and she was planned for discharge home.  However, despite IM nubain and oral percocet adequate pain control could not be achieved and the patient was admitted for pain management.  Pregnancy has been complicated by depression. She was started on prozac 20mg  History OB History    Grav Para Term Preterm Abortions TAB SAB Ect Mult Living   1              Past Medical History  Diagnosis Date  . PCOS (polycystic ovarian syndrome)    Past Surgical History  Procedure Date  . Gallbladder surgery    Family History: family history is negative for Anesthesia problems, and Hypotension, and Malignant hyperthermia, and Pseudochol deficiency, . Social History:  reports that she has never smoked. She has never used smokeless tobacco. She reports that she does not drink alcohol or use illicit drugs.  ROS: as above  Dilation: 2 Effacement (%): 70 Station: -1 Exam by:: Shimica Robinson Rn Blood pressure 117/74, pulse 91, temperature 98.2 F (36.8 C), temperature source Oral, resp. rate 20, height 5\' 4"  (1.626 m), weight 118.842 kg (262 lb), SpO2 97.00%. Exam Physical Exam   AOx3, signicifantly distressed Gravid obese soft FHT 145 + accels, no decels cvx 2/70/-1 toco irregular Q5-7  Prenatal labs: ABO, Rh: A/Positive/-- (05/29 0000) Antibody:   Rubella: Immune (05/29 0000) RPR: Nonreactive (05/29 0000)  HBsAg: Negative (05/29 0000)  HIV: Non-reactive (05/29 0000)  GBS: Negative (11/07 0000)   Assessment/Plan: G1P0 @ 40 wks in prodromal labor admitted for pain control 1) Admit 2) Epidural 3) Ativan prn 4) Ambien prn 5) Likely needs cervical ripening   Estiven Kohan H. 07/16/2011, 12:41 PM

## 2011-07-16 NOTE — Progress Notes (Signed)
Pt states no difference in the way the catheter feels.  PCEA pushed again.  Pt face is relaxed and pt dosing at intervals

## 2011-07-16 NOTE — Anesthesia Procedure Notes (Addendum)
Epidural Patient location during procedure: OB  Preanesthetic Checklist Completed: patient identified, site marked, surgical consent, pre-op evaluation, timeout performed, IV checked, risks and benefits discussed and monitors and equipment checked  Epidural Patient position: sitting Prep: site prepped and draped and DuraPrep Patient monitoring: continuous pulse ox and blood pressure Approach: midline Injection technique: LOR air  Needle:  Needle type: Tuohy  Needle gauge: 17 G Needle length: 9 cm Needle insertion depth: 8 cm Catheter type: closed end flexible Catheter size: 19 Gauge Catheter at skin depth: 15 cm Test dose: negative  Assessment Events: blood not aspirated, injection not painful, no injection resistance, negative IV test and no paresthesia  Additional Notes Dosing of Epidural:  1st dose, through catheter ............................................Marland Kitchen epi 1:200K + Xylocaine 40 mg  2nd dose, through catheter, after waiting 3 minutes...Marland KitchenMarland Kitchenepi 1:200K + Xylocaine 40 mg  3rd dose, through catheter after waiting 3 minutes .............................Marcaine   4mg    ( mg Marcaine are expressed as equivilent  cc's medication removed from the 0.1%Bupiv / fentanyl syringe from L&D pump)  ( 2% Xylo charted as a single dose in Epic Meds for ease of charting; actual dosing was fractionated as above, for saftey's sake)  As each dose occurred, patient was free of IV sx; and patient exhibited no evidence of SA injection.  Patient is more comfortable after epidural dosed. Please see RN's note for documentation of vital signs,and FHR which are stable.   Epidural Patient location during procedure: OB Start time: 07/16/2011 9:57 PM  Staffing Anesthesiologist: Brayton Caves R Performed by: anesthesiologist   Preanesthetic Checklist Completed: patient identified, site marked, surgical consent, pre-op evaluation, timeout performed, IV checked, risks and benefits  discussed and monitors and equipment checked  Epidural Patient position: sitting Prep: site prepped and draped and DuraPrep Patient monitoring: continuous pulse ox and blood pressure Approach: midline Injection technique: LOR air and LOR saline  Needle:  Needle type: Tuohy  Needle gauge: 17 G Needle length: 9 cm Needle insertion depth: 8 cm Catheter type: closed end flexible Catheter size: 19 Gauge Catheter at skin depth: 13 cm Test dose: negative  Assessment Events: blood not aspirated, injection not painful, no injection resistance, negative IV test and no paresthesia  Additional Notes Patient identified.  Risk benefits discussed including failed block, incomplete pain control, headache, nerve damage, paralysis, blood pressure changes, nausea, vomiting, reactions to medication both toxic or allergic, and postpartum back pain.  Patient expressed understanding and wished to proceed.  All questions were answered.  Sterile technique used throughout procedure and epidural site dressed with sterile barrier dressing. No paresthesia or other complications noted.The patient did not experience any signs of intravascular injection such as tinnitus or metallic taste in mouth nor signs of intrathecal spread such as rapid motor block. Please see nursing notes for vital signs.

## 2011-07-16 NOTE — Progress Notes (Signed)
Patient ID: [name], female   DOB: [birth year], 25 y.o.   MRN: [id number]  Labor PN  S: More comfortable, but still c/o discomfort on the left side and discomfort at the catheter site O: Filed Vitals:   07/16/11 1501 07/16/11 1531 07/16/11 1601 07/16/11 1631  BP: 111/68 112/63 118/68 112/70  Pulse: 110 107 108 112  Temp:   99.1 F (37.3 C)   TempSrc:   Oral   Resp: 20 18 20 16   Height:      Weight:      SpO2:       NAD Cervix 4-5/80/-2 FHT: 140 + accels no decels, reactive Toco: irregular  A/P 1) AROM meconium stained fluid 2) IUPC placed 3) Consider augmentation prn

## 2011-07-16 NOTE — Treatment Plan (Signed)
Report to Belenda Cruise, RN pt may come to 165

## 2011-07-16 NOTE — ED Notes (Signed)
Dr Tenny Craw notified of pt's sve after receiving Nubain. Pt stable for d/c home.

## 2011-07-16 NOTE — Treatment Plan (Signed)
Call to Dr Tenny Craw to notify of patients status.  Pt remains unchanged.  Medications did not help.  Orders to admit and pt may have Epidural

## 2011-07-16 NOTE — Progress Notes (Signed)
Pt c/o feeling the foley catheter.  Bulb delflated and inserted higher.  Pt states she can still feel catheter. Pt positioned in high fowlers and PCEA used to try to decrease sensation of catheter

## 2011-07-16 NOTE — Progress Notes (Signed)
Pt uncomfortable with ctxs. Sitting up in bed and moving making it difficult to monitor

## 2011-07-16 NOTE — Treatment Plan (Signed)
Call to Dr Tenny Craw to notify of pt unchanged cervix and family request to speak with her.  Orders received for patient to take 2 percocet and evaluate pain level

## 2011-07-16 NOTE — Progress Notes (Signed)
Dr. Jean Rosenthal at bedside to inject bolus for pain relief. Dr. Jean Rosenthal educated patient and family about options if the bolus does not relieve pain. Patient verbalized understanding. Dr. Jean Rosenthal requested notification if patient not feeling significant relief by 2140. Candise Che, RN

## 2011-07-16 NOTE — Treatment Plan (Signed)
Call to Dr Tenny Craw, notified that patient remains in MAU as a result of fetal strip no reactive.  Plans/orders to continue to monitor and pt may be d/c home when reactive and reassuring

## 2011-07-16 NOTE — Progress Notes (Signed)
Replaced epidural pump with Dr Jean Rosenthal.  EPCA given per Dr Jean Rosenthal

## 2011-07-16 NOTE — Progress Notes (Signed)
Dr Tenny Craw notified of pt's admission and status. Will watch pt and reck cervix for cervical change.

## 2011-07-16 NOTE — Anesthesia Preprocedure Evaluation (Signed)
Anesthesia Evaluation  Patient identified by MRN, date of birth, ID band Patient awake    Reviewed: Allergy & Precautions, H&P , Patient's Chart, lab work & pertinent test results  Airway Mallampati: II TM Distance: >3 FB Neck ROM: full    Dental  (+) Teeth Intact   Pulmonary  clear to auscultation        Cardiovascular regular Normal    Neuro/Psych    GI/Hepatic   Endo/Other  Morbid obesity  Renal/GU      Musculoskeletal   Abdominal   Peds  Hematology   Anesthesia Other Findings       Reproductive/Obstetrics (+) Pregnancy                           Anesthesia Physical Anesthesia Plan  ASA: III  Anesthesia Plan: Epidural   Post-op Pain Management:    Induction:   Airway Management Planned:   Additional Equipment:   Intra-op Plan:   Post-operative Plan:   Informed Consent: I have reviewed the patients History and Physical, chart, labs and discussed the procedure including the risks, benefits and alternatives for the proposed anesthesia with the patient or authorized representative who has indicated his/her understanding and acceptance.   Dental Advisory Given  Plan Discussed with:   Anesthesia Plan Comments: (Labs checked- platelets confirmed with RN in room. Fetal heart tracing, per RN, reported to be stable enough for sitting procedure. Discussed epidural, and patient consents to the procedure:  included risk of possible headache,backache, failed block, allergic reaction, and nerve injury. This patient was asked if she had any questions or concerns before the procedure started. )        Anesthesia Quick Evaluation  

## 2011-07-17 ENCOUNTER — Encounter (HOSPITAL_COMMUNITY): Payer: Self-pay | Admitting: *Deleted

## 2011-07-17 LAB — ABO/RH: ABO/RH(D): A POS

## 2011-07-17 MED ORDER — METHYLERGONOVINE MALEATE 0.2 MG/ML IJ SOLN: 0.2000 mg | INTRAMUSCULAR | Status: DC | PRN

## 2011-07-17 MED ORDER — ZOLPIDEM TARTRATE 5 MG PO TABS: 5.0000 mg | ORAL_TABLET | Freq: Every evening | ORAL | Status: DC | PRN

## 2011-07-17 MED ORDER — METHYLERGONOVINE MALEATE 0.2 MG PO TABS: 0.2000 mg | ORAL_TABLET | ORAL | Status: DC | PRN

## 2011-07-17 MED ORDER — BENZOCAINE-MENTHOL 20-0.5 % EX AERO: 1.0000 "application " | INHALATION_SPRAY | CUTANEOUS | Status: DC | PRN

## 2011-07-17 MED ORDER — BENZOCAINE-MENTHOL 20-0.5 % EX AERO
INHALATION_SPRAY | CUTANEOUS | Status: AC
  Filled 2011-07-17: qty 56

## 2011-07-17 MED ORDER — ONDANSETRON HCL 4 MG PO TABS: 4.0000 mg | ORAL_TABLET | ORAL | Status: DC | PRN

## 2011-07-17 MED ORDER — PRENATAL PLUS 27-1 MG PO TABS
1.0000 | ORAL_TABLET | Freq: Every day | ORAL | Status: DC
  Administered 2011-07-17 – 2011-07-19 (×3): 1 via ORAL
  Filled 2011-07-17 (×3): qty 1

## 2011-07-17 MED ORDER — SENNOSIDES-DOCUSATE SODIUM 8.6-50 MG PO TABS
2.0000 | ORAL_TABLET | Freq: Every day | ORAL | Status: DC
  Administered 2011-07-18 (×2): 2 via ORAL

## 2011-07-17 MED ORDER — IBUPROFEN 600 MG PO TABS
600.0000 mg | ORAL_TABLET | Freq: Four times a day (QID) | ORAL | Status: DC
  Administered 2011-07-17 – 2011-07-19 (×8): 600 mg via ORAL
  Filled 2011-07-17 (×8): qty 1

## 2011-07-17 MED ORDER — ONDANSETRON HCL 4 MG/2ML IJ SOLN: 4.0000 mg | INTRAMUSCULAR | Status: DC | PRN

## 2011-07-17 MED ORDER — LANOLIN HYDROUS EX OINT: TOPICAL_OINTMENT | CUTANEOUS | Status: DC | PRN

## 2011-07-17 MED ORDER — OXYTOCIN 10 UNIT/ML IJ SOLN
INTRAMUSCULAR | Status: AC
  Filled 2011-07-17: qty 2

## 2011-07-17 MED ORDER — DIPHENHYDRAMINE HCL 25 MG PO CAPS: 25.0000 mg | ORAL_CAPSULE | Freq: Four times a day (QID) | ORAL | Status: DC | PRN

## 2011-07-17 MED ORDER — SIMETHICONE 80 MG PO CHEW: 80.0000 mg | CHEWABLE_TABLET | ORAL | Status: DC | PRN

## 2011-07-17 MED ORDER — OXYCODONE-ACETAMINOPHEN 5-325 MG PO TABS
1.0000 | ORAL_TABLET | ORAL | Status: DC | PRN
  Administered 2011-07-18: 1 via ORAL
  Filled 2011-07-17: qty 1

## 2011-07-17 MED ORDER — TETANUS-DIPHTH-ACELL PERTUSSIS 5-2.5-18.5 LF-MCG/0.5 IM SUSP
0.5000 mL | Freq: Once | INTRAMUSCULAR | Status: AC
  Administered 2011-07-18: 0.5 mL via INTRAMUSCULAR
  Filled 2011-07-17: qty 0.5

## 2011-07-17 MED ORDER — WITCH HAZEL-GLYCERIN EX PADS: 1.0000 "application " | MEDICATED_PAD | CUTANEOUS | Status: DC | PRN

## 2011-07-17 MED ORDER — FLUOXETINE HCL 20 MG PO CAPS
20.0000 mg | ORAL_CAPSULE | Freq: Every day | ORAL | Status: DC
  Administered 2011-07-17 – 2011-07-19 (×3): 20 mg via ORAL
  Filled 2011-07-17 (×3): qty 1

## 2011-07-17 MED ORDER — DIBUCAINE 1 % RE OINT: 1.0000 "application " | TOPICAL_OINTMENT | RECTAL | Status: DC | PRN

## 2011-07-17 NOTE — Progress Notes (Signed)
2145 Dr. Jean Rosenthal placed second epidural creating second charge for procedure and tray. Candise Che, RN

## 2011-07-17 NOTE — Progress Notes (Signed)
Called Dr. Tenny Craw, pt has a lip and involuntary pushing. Dr. Tenny Craw wants patient to labor down. Candise Che, RN

## 2011-07-17 NOTE — Anesthesia Postprocedure Evaluation (Signed)
  Anesthesia Post-op Note  Patient: [name]  Procedure(s) Performed: * No procedures listed *  Patient Location: Mother/Baby  Anesthesia Type: Epidural  Level of Consciousness: awake  Airway and Oxygen Therapy: Patient Spontanous Breathing  Post-op Pain: none  Post-op Assessment: Patient's Cardiovascular Status Stable and Respiratory Function Stable  Post-op Vital Signs: Reviewed and stable  Complications: No apparent anesthesia complications

## 2011-07-18 LAB — CBC
Hemoglobin: 9.8 g/dL — ABNORMAL LOW (ref 12.0–15.0)
MCH: 27.1 pg (ref 26.0–34.0)

## 2011-07-18 NOTE — Progress Notes (Signed)
Post Partum Day 1 Subjective: no complaints  Objective: Blood pressure 115/79, pulse 105, temperature 98.2 F (36.8 C),  breastfeeding.  Physical Exam:  General: alert Lochia: appropriate Uterine Fundus: firm   Basename 07/18/11 0625 07/16/11 1130  HGB 9.8* 12.6  HCT 30.2* 37.4    Assessment/Plan: Plan for discharge tomorrow   LOS: 2 days   Justen Fonda D 07/18/2011, 8:59 AM

## 2011-07-19 NOTE — Discharge Summary (Signed)
Discharge diagnoses  #1-40 week intrauterine pregnancy delivered 8 lbs. 6 oz. Female infant Apgars 8 and 9  #2-blood type A-positive  #3-obesity  Procedures  #1-normal spontaneous delivery  #2-second-degree midline episiotomy and repair  Summary-this 25 year old female presented with early labor and during the course of her intrapartum management she underwent amniotomy with production of meconium-stained amniotic fluid but ultimately had a normal spontaneous delivery of an 8 lbs. 6 oz. Female infant with Apgars of 8 and 9 over second-degree midline episiotomy which was repaired without difficulty-patient was delivered at about 5 AM on the morning of 12/10 Dr. Waynard Reeds.  The patient's postpartum course was totally benign. Her CBC on the morning of 12/11 was 9.8 with a white count of 18,700 platelet count 187,000. On the morning of 12/12 she was ambulating well tolerating a regular diet well was breast-feeding without difficulty and was desirous of discharge. Accordingly she was given all appropriate instructions for discharge brochure and understood all structures well. Discharge medications will include vitamins-1 days long she is breast-feeding and she will pickups and Feosol capsules to use one 4 times a week until she returns for her postpartum visit in approximately 4 weeks' time. She will continue her Prozac 20 mg daily and will use Advil 2 every 4 hours or 3 every 6 hours as needed for cramping or pain. Condition on discharge improved

## 2011-07-21 ENCOUNTER — Encounter (HOSPITAL_COMMUNITY): Payer: Managed Care, Other (non HMO)

## 2012-12-10 ENCOUNTER — Encounter: Payer: Self-pay | Admitting: Obstetrics and Gynecology

## 2012-12-10 DIAGNOSIS — F32A Depression, unspecified: Secondary | ICD-10-CM | POA: Insufficient documentation

## 2012-12-10 DIAGNOSIS — F329 Major depressive disorder, single episode, unspecified: Secondary | ICD-10-CM | POA: Insufficient documentation

## 2012-12-10 DIAGNOSIS — G43909 Migraine, unspecified, not intractable, without status migrainosus: Secondary | ICD-10-CM | POA: Insufficient documentation

## 2012-12-11 ENCOUNTER — Encounter: Payer: Self-pay | Admitting: Obstetrics and Gynecology

## 2012-12-11 ENCOUNTER — Ambulatory Visit: Payer: Self-pay | Admitting: Obstetrics and Gynecology

## 2012-12-11 ENCOUNTER — Ambulatory Visit (INDEPENDENT_AMBULATORY_CARE_PROVIDER_SITE_OTHER): Payer: Managed Care, Other (non HMO) | Admitting: Obstetrics and Gynecology

## 2012-12-11 VITALS — BP 102/60 | Ht 64.25 in | Wt 246.0 lb

## 2012-12-11 DIAGNOSIS — Z01419 Encounter for gynecological examination (general) (routine) without abnormal findings: Secondary | ICD-10-CM

## 2012-12-11 DIAGNOSIS — Z0000 Encounter for general adult medical examination without abnormal findings: Secondary | ICD-10-CM

## 2012-12-11 MED ORDER — NORETHINDRONE 0.35 MG PO TABS: 1.0000 | ORAL_TABLET | Freq: Every day | ORAL | Status: DC

## 2012-12-11 NOTE — Progress Notes (Signed)
27 y.o.  Married  Caucasian female   G1P1001 here for annual exam. Hughie Closs is now 49 months old.  Pt is very concerned about her 30# weight gain since pregnancy, and attributes it to the micronor.  She also c/o worsening anxiety with occ episodes of "anxiety attacks".  She is still taking prozac 20 mg which she started on late in her pregnancy.  She plans to continue to breast feed until [employer] naturally, within reason.    Patient's last menstrual period was 10/03/2010.          Sexually active: yes  The current method of family planning is OCP (estrogen/progesterone).    Exercising: running after a toddler Last mammogram:  never Last pap smear: 10/25/09 neg  postpart pap 08/17/2011 neg History of abnormal pap: no Smoking:never Alcohol: no Last colonoscopy:08/17/09 normal( stomach migraines) Last Bone Density:  never Last tetanus shot: not sure Last cholesterol check: not sure  Hgb:    13.2            Urine: neg    Health Maintenance  Topic Date Due  . Pap Smear  11/12/2003  . Influenza Vaccine  04/07/2013  . Tetanus/tdap  07/17/2021    Family History  Problem Relation Age of Onset  . Anesthesia problems Neg Hx   . Hypotension Neg Hx   . Malignant hyperthermia Neg Hx   . Pseudochol deficiency Neg Hx   . Thyroid disease Mother   . Hyperlipidemia Father   . Cancer Father     prostate cancer  . Breast cancer Maternal Grandmother   . Cancer Maternal Grandmother     bone cancer  . Diabetes Maternal Grandfather     Patient Active Problem List   Diagnosis Date Noted  . Migraines   . Depression     Past Medical History  Diagnosis Date  . PCOS (polycystic ovarian syndrome)   . Migraines   . Depression   . Infertility, female     clomid  . Stomach problems     (migraines or spasms)    Past Surgical History  Procedure Laterality Date  . Gallbladder surgery    . Wisdom tooth extraction    . Upper gi endoscopy  2011    Allergies: Penicillins  Current  Outpatient Prescriptions  Medication Sig Dispense Refill  . Acetaminophen (TYLENOL PO) Take by mouth as needed.      Marland Kitchen FLUoxetine (PROZAC) 20 MG capsule Take 20 mg by mouth daily.       . Ibuprofen (MOTRIN PO) Take by mouth as needed.       . norethindrone (MICRONOR,CAMILA,ERRIN) 0.35 MG tablet Take 1 tablet by mouth daily.      . Multiple Vitamins-Minerals (MULTIVITAMIN PO) Take by mouth daily.        No current facility-administered medications for this visit.    ROS: Pertinent items are noted in HPI.  Social Hx:  Married, one daughter Hughie Closs bort 12.10.2012.  Daughter of Eliseo Squires.  Stay at home mom.  Husband Alex.  Exam:    BP 102/60  Ht 5' 4.25" (1.632 m)  Wt 246 lb (111.585 kg)  BMI 41.9 kg/m2  LMP 10/03/2010  Breastfeeding? Yes   Wt Readings from Last 3 Encounters:  12/11/12 246 lb (111.585 kg)  07/16/11 262 lb (118.842 kg)  09/06/10 259 lb 4 oz (117.595 kg)     Ht Readings from Last 3 Encounters:  12/11/12 5' 4.25" (1.632 m)  07/16/11 5\' 4"  (1.626 m)  09/06/10  5\' 4"  (1.626 m)    General appearance: alert, cooperative and appears stated age Head: Normocephalic, without obvious abnormality, atraumatic Neck: no adenopathy, supple, symmetrical, trachea midline and thyroid not enlarged, symmetric, no tenderness/mass/nodules Lungs: clear to auscultation bilaterally Breasts: Inspection negative, No nipple retraction or dimpling, No nipple discharge or bleeding, No axillary or supraclavicular adenopathy, Normal to palpation without dominant masses Heart: regular rate and rhythm Abdomen: soft, non-tender; bowel sounds normal; no masses,  no organomegaly Extremities: extremities normal, atraumatic, no cyanosis or edema Skin: Skin color, texture, turgor normal. No rashes or lesions Lymph nodes: Cervical, supraclavicular, and axillary nodes normal. No abnormal inguinal nodes palpated Neurologic: Grossly normal   Pelvic: External genitalia:  no lesions               Urethra:  normal appearing urethra with no masses, tenderness or lesions              Bartholins and Skenes: normal                 Vagina: normal appearing vagina with normal color and discharge, no lesions              Cervix: normal appearance              Pap taken: no        Bimanual Exam:  Uterus:  uterus is normal size, shape, consistency and nontender                                      Adnexa: normal adnexa in size, nontender and no masses                                      Rectovaginal: Confirms                                      Anus:  normal sphincter tone, no lesions  A: normal gyn exam, breastfeeding, on micronor     Anxiety with some panic attacks despite prozac 20 mg     Wt gain since delivery pt attributes to micronor.     Migraine heachaches, previously relieved with chiropractor tx     P: pap smear counseled on breast self exam, use and side effects of OCP's, family planning choices, diet and exercise return annually or prn Discussed option for Mirena and handout given.  Pt wants to think about it.  Has heard "horror stories" about IUD's but is very upset about her weight gain. Pt will try taking two of her 20 mg prozac capsules to see if she feels better about the anxiety on the higher dose.  She will try it for about 3 weeks, and will call me for a new rx when she settles on which dose she prefers.  RF Micronor today for 1 year.     An After Visit Summary was printed and given to the patient.

## 2012-12-11 NOTE — Patient Instructions (Signed)

## 2013-01-02 ENCOUNTER — Encounter: Payer: Self-pay | Admitting: Obstetrics and Gynecology

## 2013-01-02 ENCOUNTER — Other Ambulatory Visit: Payer: Self-pay | Admitting: Obstetrics and Gynecology

## 2013-01-02 MED ORDER — FLUOXETINE HCL 20 MG PO CAPS: 20.0000 mg | ORAL_CAPSULE | Freq: Every day | ORAL | Status: DC

## 2013-01-03 ENCOUNTER — Encounter: Payer: Self-pay | Admitting: Obstetrics and Gynecology

## 2013-06-12 ENCOUNTER — Other Ambulatory Visit: Payer: Self-pay

## 2013-07-24 ENCOUNTER — Telehealth: Payer: Self-pay | Admitting: Obstetrics and Gynecology

## 2013-07-24 NOTE — Telephone Encounter (Signed)
Pt would like to get a refill for the fluoxetine.

## 2013-07-25 NOTE — Telephone Encounter (Signed)
Patient says she was put on Prozac back in 5/14 with Dr.Romine, she was nursing her daughter and said Dr. Tresa Res had some reservations about her being on it since there wasn't enough research, so patient went off of it. Patient says she wants to get back on it. Says if any other Doctor has any questions she wouldn't mind being put on something else but she hasn't been put on any other thing besides prozac.  Please advise patient okay to wait until Monday when Dr. Edward Jolly is back in office.

## 2013-07-29 ENCOUNTER — Other Ambulatory Visit: Payer: Self-pay | Admitting: Obstetrics and Gynecology

## 2013-07-29 MED ORDER — FLUOXETINE HCL 20 MG PO CAPS: 20.0000 mg | ORAL_CAPSULE | Freq: Every day | ORAL | Status: DC

## 2013-07-29 NOTE — Telephone Encounter (Signed)
I will prescribe Prozac 20 mg daily, the same dose she was on previously in May 2014, when she last saw Dr. Tresa Res. If she is still breast feeding, please have her contact her pediatrician to be sure that they agree with her taking this medication.

## 2013-07-29 NOTE — Progress Notes (Addendum)
Patient doesn't use [facility] would like rx sent to [facility] in [facility]  (See phone note)

## 2013-07-29 NOTE — Addendum Note (Signed)
Addended by: Lorraine Lax on: 07/29/2013 10:14 AM   Modules accepted: Orders, Medications

## 2013-07-29 NOTE — Telephone Encounter (Signed)
Patient notified. Told patient rx had been sent, she no longer uses [facility]. Would like rx called into [facility] in [place].   Prozac 20 mg #90/1 [facility] [place], patient aware.

## 2013-08-27 ENCOUNTER — Other Ambulatory Visit: Payer: Self-pay | Admitting: Dermatology

## 2013-09-10 ENCOUNTER — Telehealth: Payer: Self-pay | Admitting: Obstetrics and Gynecology

## 2013-09-10 NOTE — Telephone Encounter (Signed)
Spoke with patient. She has been breastfeeding for two years and not had a cycle. States that on the 14th of January, had some abdominal cramping and one week later developed daily nausea with headaches and noticed changes in breasts and vaginal area, states "I can see veins that usually are not there." Negative home pregnancy testing.   I advised that office visit with a provider would be appropriate for exam and lab work if necessary.  Patient wondering if she can just come in for blood work to check for pregnancy prior.  Advised that I could request that from Dr. Charlies Constable (first available provider, patient has only seen Dr. Joan Flores in the past). Advised would send message and return call.  Okay per Gay Filler for patient to see Dr. Charlies Constable for Monday at 1130. Spoke with patient and advised of appointment and she was agreeable to schedule, but requests lab work first.   Dr. Charlies Constable, can you advise?

## 2013-09-10 NOTE — Telephone Encounter (Signed)
Patient has taken an OTC pregnancy test and reports it was negative. Patient say she doesn't have a menstrual cycle and is not sure when to take another test. Patient stated, "If I am not pregnant, there is something seriously wrong with me." Please advise?

## 2013-09-10 NOTE — Telephone Encounter (Signed)
Spoke with Dr. Charlies Constable, to keep OV with her for eval as suggested on Monday.

## 2013-09-11 NOTE — Telephone Encounter (Signed)
Spoke with pt about keeping appt with TL for eval, and pt reports she needs to reschedule to 09-19-13 if possible. OV with TL 09-19-13 at 8 am.

## 2013-09-15 ENCOUNTER — Ambulatory Visit: Payer: Managed Care, Other (non HMO) | Admitting: Gynecology

## 2013-09-19 ENCOUNTER — Encounter: Payer: Self-pay | Admitting: Gynecology

## 2013-09-19 ENCOUNTER — Ambulatory Visit (INDEPENDENT_AMBULATORY_CARE_PROVIDER_SITE_OTHER): Payer: BC Managed Care – PPO | Admitting: Gynecology

## 2013-09-19 VITALS — BP 110/80 | HR 62 | Resp 14 | Ht 64.25 in | Wt 252.0 lb

## 2013-09-19 DIAGNOSIS — N912 Amenorrhea, unspecified: Secondary | ICD-10-CM

## 2013-09-19 LAB — PROLACTIN: Prolactin: 11 ng/mL

## 2013-09-19 LAB — TSH: TSH: 1.389 u[IU]/mL (ref 0.350–4.500)

## 2013-09-19 LAB — FOLLICLE STIMULATING HORMONE: FSH: 5.1 m[IU]/mL

## 2013-09-19 LAB — HCG, SERUM, QUALITATIVE: PREG SERUM: NEGATIVE

## 2013-09-19 LAB — POCT URINE PREGNANCY: Preg Test, Ur: NEGATIVE

## 2013-09-19 NOTE — Progress Notes (Signed)
Subjective:     Patient ID: [name], female   DOB: 1985/09/18, 28 y.o.   MRN: [id number]  HPI Comments: Pt is here for evaluation of her amenorrhea.  Pt believes she is pregnant, she is not using contraception and is still breast feeding her last child who is now 2y old.  Pt feeds multiple times a day- 3-10x/d and several times at night.  Pt reports that she is still producing a lot of milk but she feels that this week she produced less.  Pt denies any recent illnesses.  Pt stopped the micronor about 89m ago.  Pt used clomid to get pregnant. Pt believes she is pregnant because she is nauseated, cramping and low back pain for 3w which have resolved    Review of Systems  Constitutional: Negative for activity change and fatigue.  HENT:       Metallic taste in mouth  Gastrointestinal: Positive for nausea and constipation (harder than usual). Negative for vomiting.       Bloated  Genitourinary: Positive for vaginal discharge (clear to white) and pelvic pain (menstrual like cramping).  Skin:       Blotchy around areolas and prominent veins on chest  Neurological: Positive for headaches.       Objective:   Physical Exam  Nursing note and vitals reviewed. Constitutional: She is oriented to person, place, and time. She appears well-developed and well-nourished.  Abdominal: Soft. She exhibits no distension. There is no tenderness. There is no guarding.  Neurological: She is alert and oriented to person, place, and time.  Skin: Skin is warm and dry. No rash noted.  No rash noted  Pelvic exam: VULVA: normal appearing vulva with no masses, tenderness or lesions, VAGINA: normal appearing vagina with normal color and discharge, no lesions, CERVIX: normal appearing cervix without discharge or lesions, UTERUS: uterus is normal size, shape, consistency and nontender, ADNEXA: no masses, exam limited by habitus.      Assessment:     amenorrhea     Plan:     Will check TSH, PRL, HCG,  FSH Will contact with results

## 2013-09-22 NOTE — Telephone Encounter (Signed)
Spoke with patient regarding lab results. Advised were normal per your notes. She is still having symptoms and agreeable to scheduling PUS.  Patient is scheduled with you for 09/30/13, however, she is requesting an earlier appointment. Do you feel she needs to be seen earlier for a PUS with another provider to schedule for 2/19?

## 2013-09-22 NOTE — Telephone Encounter (Signed)
See if she can be scheduled for tomorrow?

## 2013-09-22 NOTE — Telephone Encounter (Signed)
Message copied by Michele Mcalpine on Mon Sep 22, 2013 11:28 AM ------      Message from: Elveria Rising      Created: Mon Sep 22, 2013 10:48 AM       All of the labs are normal!  We should get an u/s if she is still having her symptoms to rule out an ovarian cyst, I will drop the order ------

## 2013-09-22 NOTE — Addendum Note (Signed)
Addended by: Elveria Rising on: 09/22/2013 10:50 AM   Modules accepted: Orders

## 2013-09-22 NOTE — Telephone Encounter (Signed)
Patient says she received her lab results on MyChart. Patient has some questions about what to do next.

## 2013-09-23 NOTE — Telephone Encounter (Signed)
Due to inclement weather, unable to schedule for 09-23-13.

## 2013-09-30 ENCOUNTER — Ambulatory Visit (INDEPENDENT_AMBULATORY_CARE_PROVIDER_SITE_OTHER): Payer: BC Managed Care – PPO | Admitting: Gynecology

## 2013-09-30 ENCOUNTER — Ambulatory Visit (INDEPENDENT_AMBULATORY_CARE_PROVIDER_SITE_OTHER): Payer: BC Managed Care – PPO

## 2013-09-30 VITALS — BP 116/64 | HR 72 | Resp 18 | Ht 64.25 in | Wt 252.0 lb

## 2013-09-30 DIAGNOSIS — N912 Amenorrhea, unspecified: Secondary | ICD-10-CM

## 2013-09-30 DIAGNOSIS — N83299 Other ovarian cyst, unspecified side: Secondary | ICD-10-CM

## 2013-09-30 DIAGNOSIS — N83209 Unspecified ovarian cyst, unspecified side: Secondary | ICD-10-CM

## 2013-09-30 DIAGNOSIS — E282 Polycystic ovarian syndrome: Secondary | ICD-10-CM

## 2013-09-30 LAB — POCT URINE PREGNANCY: PREG TEST UR: NEGATIVE

## 2013-09-30 NOTE — Progress Notes (Signed)
     Pt seen here for PUS to assess her amenorrhea and pelvic pressure.  U/S images were reviewed with the pt, her uterus is normal with an EMS of 10, symmtetrical.  The left ovary with multiple follicles c/w PCOS.  Right ovary also with rim of follicles and complex mass 5.5x4.2x3.6cm, cystic and solid. Discussed at length u/s findings.  Pt has 28yo, we have asked for record release to determine if ovarian cyst was present during her pregnancy.  If not, recommend repeating her u/s in 4-6w, DDx dermoid, hemorrhagic cyst.  Pt agreeable.  We suggerst she consider ocp's based on her cyst and probably PCOS.  We discussed the impact of PCOS on both fertility and risks of DM, CAD and lipids from overwhelming LH affect.  We also discussed risks of prolonged anovulation on endometrium. Pt is unsure if she wants to try ocps again, was on in the past.  We will get a upt today as she had a negative quant 2w ago and has had sex only once since due to dyspareunia from her ovary, we suggested progestin to bring on her cycle and condom use going forward in event she ends up in OR. Questions addressed 56m spent reviewing u/s and discussing poss etiologies of ovarian cyst and PCOS, >50% face to face

## 2013-10-01 ENCOUNTER — Telehealth: Payer: Self-pay | Admitting: Gynecology

## 2013-10-01 DIAGNOSIS — N83299 Other ovarian cyst, unspecified side: Secondary | ICD-10-CM

## 2013-10-01 DIAGNOSIS — E282 Polycystic ovarian syndrome: Secondary | ICD-10-CM

## 2013-10-01 NOTE — Telephone Encounter (Signed)
Call to patient, detailed instructions given as directed by Dr Charlies Constable.  Patient asking about safety of the provera and OCP with breastfeeding two year old.  Advised that Provera does not have estrogen but will recheck with Dr Charlies Constable and Dr Charlies Constable will send RX. Patient requests [facility].  Dr Charlies Constable, I do not see RX sent.  Please confirm.

## 2013-10-01 NOTE — Telephone Encounter (Signed)
Pt had a negative upt yesterday and negative blood 2w ago, she states that she had sex once 2w ago, she can take a home preg test and then start provera 10mg  for 10d, she can start ocp the first day of flow and if she is still taking her provera when she starts bleeding, she can stop it.  BTB can be common with new pill starts, i will drop orders

## 2013-10-01 NOTE — Telephone Encounter (Signed)
[email or web address] has number confirmation. LM that RX has been called but need to give instructions so call back when office re-opens.

## 2013-10-01 NOTE — Telephone Encounter (Signed)
Dr. Charlies Constable, patient has decided she would like to begin with your suggestion of starting on birth control pills.  Can you place order and give instructions on when to start?

## 2013-10-01 NOTE — Telephone Encounter (Signed)
Patient calling to "discuss options" with the nurse after her visit yesterday with Dr. Charlies Constable.

## 2013-10-03 MED ORDER — NORETHIN ACE-ETH ESTRAD-FE 1-20 MG-MCG PO TABS: 1.0000 | ORAL_TABLET | Freq: Every day | ORAL | Status: DC

## 2013-10-03 MED ORDER — MEDROXYPROGESTERONE ACETATE 10 MG PO TABS: 10.0000 mg | ORAL_TABLET | Freq: Every day | ORAL | Status: DC

## 2013-10-07 ENCOUNTER — Encounter: Payer: Self-pay | Admitting: Gynecology

## 2013-10-13 ENCOUNTER — Other Ambulatory Visit: Payer: Self-pay | Admitting: Gynecology

## 2013-10-13 ENCOUNTER — Telehealth: Payer: Self-pay | Admitting: Gynecology

## 2013-10-13 DIAGNOSIS — N83299 Other ovarian cyst, unspecified side: Secondary | ICD-10-CM

## 2013-10-13 NOTE — Telephone Encounter (Signed)
Called patient. Scheduled 6 week follow up PUS. Advised of $30 copay and of cancellation policy and cancellation fee.

## 2013-10-13 NOTE — Telephone Encounter (Addendum)
Patient is ready to schedule her 6 week follow up ultrasound.

## 2013-11-18 ENCOUNTER — Ambulatory Visit (INDEPENDENT_AMBULATORY_CARE_PROVIDER_SITE_OTHER): Payer: BC Managed Care – PPO | Admitting: Gynecology

## 2013-11-18 ENCOUNTER — Ambulatory Visit (INDEPENDENT_AMBULATORY_CARE_PROVIDER_SITE_OTHER): Payer: BC Managed Care – PPO

## 2013-11-18 VITALS — BP 134/94 | Resp 20 | Ht 64.25 in | Wt 254.0 lb

## 2013-11-18 DIAGNOSIS — E282 Polycystic ovarian syndrome: Secondary | ICD-10-CM

## 2013-11-18 DIAGNOSIS — N83209 Unspecified ovarian cyst, unspecified side: Secondary | ICD-10-CM

## 2013-11-18 DIAGNOSIS — N83299 Other ovarian cyst, unspecified side: Secondary | ICD-10-CM

## 2013-11-18 DIAGNOSIS — N912 Amenorrhea, unspecified: Secondary | ICD-10-CM

## 2013-11-18 NOTE — Progress Notes (Signed)
      Pt here with husband for repeat u/s, f/u complex ovarian cyst.  Pt did not end up starting the ocp we had recommended and she has not had a cycle since she delivered.  Pt has PCOS and conceived on clomid.  Pt is no using contraception and believes that she cannot get pregnant without intervention.  Pt today reports that she still has the right lower quadrant pain that first brought her for evaluation. U/s images were reviewed.  Complex right cyst is slightly larger than on earlier u/s 2/15.  Shadowing noted.  Uterus normal, EMS 9.47  A/P: Complex ovarian cyst-persistent, differential more consistent with dermoid or endometrioma than hemorrhagic.  Discussed surgical removal via laparoscopy with cystectomy, possible oophorectomy.  Briefly reviewed the risks and benefits of surgery.  Discussed the risk of torsion based on location and size.  Pt is not using contraception and we will need to assure that she is not pregnant to proceed to OR.  Pt will not use condoms due to irritation.  i suggested a HCG today and repeat in 1w and to consider starting ocp.  In the meantime they would like to proceed to OR.  We will schedule 2.  Chronic anovulation and obesity:  Reviewed risks of prolonged unopposed estrogen exposure to her with increased risk of uterine hyperplasia and cancer.  We also discussed the association of PCOS and diabetes, early onset of CVD.  We discussed using metformin to induce ovulatory cycles and if not interested, she would benefit to use ocp until she is ready to conceive again.  She is unsure re putting hormones in her body, she believes she has the pills, she is also concerned with passing the hormones through the breast milk as she is sportatically feeding her daugher who is 2 but feels more like a pacifier.  45m spent discussing treatment options for complex cyst and PCOS, >50% face to face

## 2013-11-19 DIAGNOSIS — N912 Amenorrhea, unspecified: Secondary | ICD-10-CM | POA: Insufficient documentation

## 2013-11-19 LAB — HCG, SERUM, QUALITATIVE: Preg, Serum: NEGATIVE

## 2013-11-24 ENCOUNTER — Encounter: Payer: Self-pay | Admitting: Gynecology

## 2013-11-24 ENCOUNTER — Telehealth: Payer: Self-pay | Admitting: Obstetrics and Gynecology

## 2013-11-24 NOTE — Telephone Encounter (Signed)
Spoke with patient. Advised that per benefits quote received, she will be responsible for $1251.28 for surgeons fees. Payment is due 2 weeks prior to the scheduled surgery date. She will receive separate phone call regarding facility charges/benefits.

## 2013-11-24 NOTE — Telephone Encounter (Signed)
Call to patient, discussed that Dr Charlies Constable wanted to have repeat pregnancy test and then consider Provera and OCP.  Last preg test was 11-20-13 so scheduled lab visit for tomorrow for repeat.  Patient states she does not have the RX so she will need Dr Charlies Constable to call in.  Advised we can check on this with Dr Charlies Constable when she retruns tomorrow and we get preg test result.  Discussed surgical date options.  She can not do 12-17-13, is agreeable to 12-10-13.  I need to confirm with MD and call her back.  To provider for lab orders.  Also need to know how much OR time and confirm assist needed.

## 2013-11-24 NOTE — Telephone Encounter (Signed)
Patient is "very confused" about scheduling surgery and she is also patient is confused about starting birth control. She thought Dr. Charlies Constable wanted her to start Provera and birth control prior to her surgery and she has some questions about this. Please advise?  [facility]  Cc: Gay Filler

## 2013-11-24 NOTE — Telephone Encounter (Signed)
Pt states she is still waiting to hear from someone to schedule her for surgery.

## 2013-11-25 ENCOUNTER — Ambulatory Visit (INDEPENDENT_AMBULATORY_CARE_PROVIDER_SITE_OTHER): Payer: BC Managed Care – PPO | Admitting: Gynecology

## 2013-11-25 DIAGNOSIS — N912 Amenorrhea, unspecified: Secondary | ICD-10-CM

## 2013-11-25 NOTE — Telephone Encounter (Signed)
Patient says she needs to reschedule her surgery date again. Please call.

## 2013-11-25 NOTE — Telephone Encounter (Signed)
Spoke to patient when here for lab work and notified that 12-31-13 appears to be first available.  Patient really prefers earlier if possible.  Moved up to 12-17-13 but need to review with Dr Charlies Constable and check on assistant.  Return call to patient, actually called her before I knew this call came in.  Continue to have scheduling difficulty due to patient request for first available, husband's work schedule and our availability.  Patient preference is 12-24-13, 2nd choice of 12-17-13, and no longer available on 12-31-13.  Will review with hospital and provider and call her back.   (Case was scheduled for 12-31-13, moved to 12-17-13 and then back to 12-31-13).

## 2013-11-26 LAB — HCG, SERUM, QUALITATIVE: PREG SERUM: NEGATIVE

## 2013-11-26 MED ORDER — NORETHIN ACE-ETH ESTRAD-FE 1-20 MG-MCG PO TABS: 1.0000 | ORAL_TABLET | Freq: Every day | ORAL | Status: DC

## 2013-11-26 MED ORDER — MEDROXYPROGESTERONE ACETATE 10 MG PO TABS: 10.0000 mg | ORAL_TABLET | Freq: Every day | ORAL | Status: DC

## 2013-11-26 NOTE — Progress Notes (Signed)
Lab visit

## 2013-11-27 NOTE — Telephone Encounter (Signed)
Spoke with patient. Advise surgery is scheduled for 12/24/13 at 0830 and was pending surgical assist availability. Patient concerned as she will need to let her husband know and he needs to request time off of work.   Message from Dr. Charlies Constable given. Advised pregnancy test is negative. Will start provera today and start ocp when she starts bleeding. Patient is agreeable to plan and verbalizes understanding of instructions.

## 2013-11-27 NOTE — Telephone Encounter (Signed)
Pt wants to talk with Gay Filler about scheduling her surgery. Patient is aware that Gay Filler is not working today. Pt insist on speaking to someone else regarding her surgery date.

## 2013-11-27 NOTE — Telephone Encounter (Signed)
Message copied by Michele Mcalpine on Thu Nov 27, 2013  4:24 PM ------      Message from: Elveria Rising      Created: Wed Nov 26, 2013  9:13 AM       pls inform pt that pregnancy test is normal, we will send in provera today, she should start ocp when she starts to bleed ------

## 2013-11-28 ENCOUNTER — Encounter: Payer: Self-pay | Admitting: Obstetrics and Gynecology

## 2013-11-28 NOTE — Telephone Encounter (Signed)
Call to patient to confirm surgery date of 12-24-13 at 0830 at [facility] and review instructions. Patient states this is not a good time right now and request I call her back next week with instructions.

## 2013-12-02 NOTE — Telephone Encounter (Signed)
Patient is returning a call to [employer]

## 2013-12-08 ENCOUNTER — Telehealth: Payer: Self-pay | Admitting: Obstetrics and Gynecology

## 2013-12-08 NOTE — Telephone Encounter (Signed)
**  MAILED LETTER TO PATIENT**  Dec 08, 2013      Dear Ms. [name],  Your surgery is scheduled for Dec 24, 2013.  Upon requesting authorization for your surgery, your insurance company has informed us that they will cover 70% of the charges after a $1000 deductible, and you will be responsible to pay approximately $1251.28.  It is our office policy that this amount is paid in full two weeks prior to your surgery. Your payment is due on  Dec 10, 2013.  If there is a balance due after your insurance company pays their portion, we will send you a bill.  If there is a refund due to you, we will send you a check within one month.  Payment may be made by cash, check, Visa, Nurse, learning disability. Payment can be made in the office or over the telephone.  If payment is not made two weeks prior to your surgery, we will have to reschedule your surgery.  The above fee includes only our fee for the surgery and does not include charges you may have from the facility, anesthesia or pathology.  If you have any questions, please call us at [phone].

## 2013-12-09 ENCOUNTER — Encounter: Payer: Self-pay | Admitting: Obstetrics and Gynecology

## 2013-12-09 ENCOUNTER — Telehealth: Payer: Self-pay | Admitting: *Deleted

## 2013-12-09 NOTE — Telephone Encounter (Signed)
See next phone note for surgery info.  Routing to provider for final review. Patient agreeable to disposition. Will close encounter   

## 2013-12-09 NOTE — Telephone Encounter (Signed)
Call to patient and confirmed surgery date of 12-24-13 at 0830 at [facility]. Surgery instruction sheet reviewed and mailed. Pre/post op appointments scheduled.  Routing to provider for final review. Patient agreeable to disposition. Will close encounter

## 2013-12-11 ENCOUNTER — Encounter (HOSPITAL_COMMUNITY): Payer: Self-pay

## 2013-12-15 ENCOUNTER — Encounter: Payer: Self-pay | Admitting: Gynecology

## 2013-12-15 ENCOUNTER — Ambulatory Visit (INDEPENDENT_AMBULATORY_CARE_PROVIDER_SITE_OTHER): Payer: BC Managed Care – PPO | Admitting: Gynecology

## 2013-12-15 DIAGNOSIS — N83209 Unspecified ovarian cyst, unspecified side: Secondary | ICD-10-CM

## 2013-12-15 DIAGNOSIS — N83299 Other ovarian cyst, unspecified side: Secondary | ICD-10-CM

## 2013-12-15 MED ORDER — HYDROMORPHONE HCL 2 MG PO TABS: 2.0000 mg | ORAL_TABLET | ORAL | Status: DC | PRN

## 2013-12-15 NOTE — Patient Instructions (Signed)
Diagnostic Laparoscopy Laparoscopy is a surgical procedure. It is used to diagnose and treat diseases inside the belly (abdomen). It is usually a brief, common, and relatively simple procedure. The laparoscopeis a thin, lighted, pencil-sized instrument. It is like a telescope. It is inserted into your abdomen through a small cut (incision). Your caregiver can look at the organs inside your body through this instrument. He or she can see if there is anything abnormal. Laparoscopy can be done either in a hospital or outpatient clinic. You may be given a mild sedative to help you relax before the procedure. Once in the operating room, you will be given a drug to make you sleep (general anesthesia). Laparoscopy usually lasts less than 1 hour. After the procedure, you will be monitored in a recovery area until you are stable and doing well. Once you are home, it will take 2 to 3 days to fully recover. RISKS AND COMPLICATIONS  Laparoscopy has relatively few risks. Your caregiver will discuss the risks with you before the procedure. Some problems that can occur include:  Infection.  Bleeding.  Damage to other organs.  Anesthetic side effects. PROCEDURE Once you receive anesthesia, your surgeon inflates the abdomen with a harmless gas (carbon dioxide). This makes the organs easier to see. The laparoscope is inserted into the abdomen through a small incision. This allows your surgeon to see into the abdomen. Other small instruments are also inserted into the abdomen through other small openings. Many surgeons attach a video camera to the laparoscope to enlarge the view. During a diagnostic laparoscopy, the surgeon may be looking for inflammation, infection, or cancer. Your surgeon may take tissue samples(biopsies). The samples are sent to a specialist in looking at cells and tissue samples (pathologist). The pathologist examines them under a microscope. Biopsies can help to diagnose or confirm a  disease. AFTER THE PROCEDURE   The gas is released from inside the abdomen.  The incisions are closed with stitches (sutures). Because these incisions are small (usually less than 1/2 inch), there is usually minimal discomfort after the procedure. There may be some mild discomfort in the throat. This is from the tube placed in the throat while you were sleeping. You may have some mild abdominal discomfort. There may also be discomfort from the instrument placement incisions in the abdomen.  The recovery time is shortened as long as there are no complications.  You will rest in a recovery room until stable and doing well. As long as there are no complications, you may be allowed to go home. FINDING OUT THE RESULTS OF YOUR TEST Not all test results are available during your visit. If your test results are not back during the visit, make an appointment with your caregiver to find out the results. Do not assume everything is normal if you have not heard from your caregiver or the medical facility. It is important for you to follow up on all of your test results. HOME CARE INSTRUCTIONS   Take all medicines as directed.  Only take over-the-counter or prescription medicines for pain, discomfort, or fever as directed by your caregiver.  Resume daily activities as directed.  Showers are preferred over baths.  You may resume sexual activities in 1 week or as directed.  Do not drive while taking narcotics. SEEK MEDICAL CARE IF:   There is increasing abdominal pain.  There is new pain in the shoulders (shoulder strap areas).  You feel lightheaded or faint.  You have the chills.  You or   your child has an oral temperature above 102 F (38.9 C).  There is pus-like (purulent) drainage from any of the wounds.  You are unable to pass gas or have a bowel movement.  You feel sick to your stomach (nauseous) or throw up (vomit). MAKE SURE YOU:   Understand these instructions.  Will watch  your condition.  Will get help right away if you are not doing well or get worse. Document Released: 10/30/2000 Document Revised: 11/18/2012 Document Reviewed: 07/24/2007 [facility] Patient Information 2014 [place], [place].  youtube ovarian cystectomy or oophrectomy or removal of ovary hibiclens wash day of and day before surgery

## 2013-12-16 NOTE — Progress Notes (Signed)
28 y.o.Married Not Hispanic or Latino female presents for preoperative consult for right ovarian cystectomy and laparoscopy for adnexal mass chronic pelvic pain .pt has had 2 PUS that showed persistence of complex ovarian cyst, questionable dermoid.  Pt was not using contraception  And has had her cycle brought on with provera and is currently taking her ocp. She is still bleeding.  Pt reports nausea and pressure  Pertinent items are noted in HPI. LMP 12/09/2013 General appearance: alert, cooperative and appears stated age Lungs: clear to auscultation bilaterally Heart: regular rate and rhythm, S1, S2 normal, no murmur, click, rub or gallop Abdomen: soft, non-tender; bowel sounds normal; no masses,  no organomegaly Pelvic: cervix normal in appearance, external genitalia normal, no cervical motion tenderness, uterus normal size, shape, and consistency, vagina normal without discharge and tenderness of right adnexa, with fullness, left negative Extremities: extremities normal, atraumatic, no cyanosis or edema Skin: Skin color, texture, turgor normal. No rashes or lesions Neurologic: Grossly normal   The procedure was discussed at length, including the expected post-operative course. We rebviewed her PUS images and pt is aware that our goal is to remove just her cyst but there is a chance that she may loose the ovary as well.  I believe the cyst to be a dermoid but an endometrioma cannot be ruled out.  Pt was counseled of risk of possible conversion to E-Lap with every laparoscopic procedure.  This would extend her time in the hospital.    Pt informed regarding risks and benefits of surgery, including but not limited to bleeding, infections, damage to bowel, bladder and major vessels either during port placement or during the surgical procedure.  Patient was made aware that damage that is seen immediately would be addressed at the time and may require conversion to an open procedure and additional  surgical consults.  In addition some damage is delayed and may require return to the operating room in the future.    Risk of blood transfusion from discussed, including the risk of infection from receiving blood was discussed.  It was recommended that the patient does not donate autologously.  The patientis accept blood products if deemed necessary.  There is a risk of formation of a deep vein thrombus in the extremities was discussed, although PAS will be placed to minimize the risk, the patient was informed that a pulmonary embolism could still form and could result in death.  Early ambulation was stressed.  She was instructed on signs and symptoms to be aware of and the need to call if they should develop.  Pt had a lap cholecystectomy, we will do beneath her umbilicus, in addition, she developed an infection in her incision, she will therefore do a hibiclens wash of her abdomen the day before and morning of OR.   All questions were addressed.  Post-operative medications dilaudid were given to the patient.       

## 2013-12-19 ENCOUNTER — Ambulatory Visit: Payer: Managed Care, Other (non HMO) | Admitting: Obstetrics and Gynecology

## 2013-12-22 ENCOUNTER — Encounter: Payer: Self-pay | Admitting: Gynecology

## 2013-12-24 ENCOUNTER — Encounter (HOSPITAL_COMMUNITY): Payer: Self-pay | Admitting: *Deleted

## 2013-12-24 ENCOUNTER — Ambulatory Visit (HOSPITAL_COMMUNITY): Payer: BC Managed Care – PPO | Admitting: Anesthesiology

## 2013-12-24 ENCOUNTER — Encounter (HOSPITAL_COMMUNITY): Payer: BC Managed Care – PPO | Admitting: Anesthesiology

## 2013-12-24 ENCOUNTER — Ambulatory Visit (HOSPITAL_COMMUNITY)
Admission: RE | Admit: 2013-12-24 | Discharge: 2013-12-24 | Disposition: A | Discharge status: Home / Self Care | Payer: BC Managed Care – PPO | Source: Ambulatory Visit | Attending: Gynecology | Admitting: Gynecology

## 2013-12-24 ENCOUNTER — Encounter (HOSPITAL_COMMUNITY)
Admission: RE | Disposition: A | Discharge status: Home / Self Care | Payer: Self-pay | Source: Ambulatory Visit | Attending: Gynecology

## 2013-12-24 DIAGNOSIS — N949 Unspecified condition associated with female genital organs and menstrual cycle: Secondary | ICD-10-CM | POA: Insufficient documentation

## 2013-12-24 DIAGNOSIS — G8929 Other chronic pain: Secondary | ICD-10-CM | POA: Insufficient documentation

## 2013-12-24 DIAGNOSIS — N83209 Unspecified ovarian cyst, unspecified side: Secondary | ICD-10-CM

## 2013-12-24 DIAGNOSIS — Z6841 Body Mass Index (BMI) 40.0 and over, adult: Secondary | ICD-10-CM | POA: Insufficient documentation

## 2013-12-24 DIAGNOSIS — K219 Gastro-esophageal reflux disease without esophagitis: Secondary | ICD-10-CM | POA: Insufficient documentation

## 2013-12-24 DIAGNOSIS — Z9889 Other specified postprocedural states: Secondary | ICD-10-CM

## 2013-12-24 HISTORY — PX: LAPAROSCOPIC OVARIAN CYSTECTOMY: SHX6248

## 2013-12-24 LAB — CBC
HCT: 40.1 % (ref 36.0–46.0)
Hemoglobin: 13.3 g/dL (ref 12.0–15.0)
MCH: 28.4 pg (ref 26.0–34.0)
MCHC: 33.2 g/dL (ref 30.0–36.0)
MCV: 85.7 fL (ref 78.0–100.0)
Platelets: 258 10*3/uL (ref 150–400)
RBC: 4.68 MIL/uL (ref 3.87–5.11)
RDW: 13.4 % (ref 11.5–15.5)
WBC: 11.1 10*3/uL — ABNORMAL HIGH (ref 4.0–10.5)

## 2013-12-24 LAB — PREGNANCY, URINE: Preg Test, Ur: NEGATIVE

## 2013-12-24 SURGERY — EXCISION, CYST, OVARY, LAPAROSCOPIC
Anesthesia: General | Site: Abdomen | Laterality: Right

## 2013-12-24 MED ORDER — FENTANYL CITRATE 0.05 MG/ML IJ SOLN
INTRAMUSCULAR | Status: AC
  Filled 2013-12-24: qty 5

## 2013-12-24 MED ORDER — BUPIVACAINE HCL (PF) 0.25 % IJ SOLN
INTRAMUSCULAR | Status: AC
  Filled 2013-12-24: qty 30

## 2013-12-24 MED ORDER — LACTATED RINGERS IV SOLN: INTRAVENOUS | Status: DC

## 2013-12-24 MED ORDER — DEXAMETHASONE SODIUM PHOSPHATE 10 MG/ML IJ SOLN
INTRAMUSCULAR | Status: DC | PRN
  Administered 2013-12-24: 10 mg via INTRAVENOUS

## 2013-12-24 MED ORDER — CEFAZOLIN SODIUM-DEXTROSE 2-3 GM-% IV SOLR
INTRAVENOUS | Status: AC
  Filled 2013-12-24: qty 100

## 2013-12-24 MED ORDER — MIDAZOLAM HCL 2 MG/2ML IJ SOLN
INTRAMUSCULAR | Status: AC
  Filled 2013-12-24: qty 2

## 2013-12-24 MED ORDER — FENTANYL CITRATE 0.05 MG/ML IJ SOLN
INTRAMUSCULAR | Status: AC
  Filled 2013-12-24: qty 2

## 2013-12-24 MED ORDER — NEOSTIGMINE METHYLSULFATE 10 MG/10ML IV SOLN
INTRAVENOUS | Status: AC
  Filled 2013-12-24: qty 1

## 2013-12-24 MED ORDER — ROCURONIUM BROMIDE 100 MG/10ML IV SOLN
INTRAVENOUS | Status: AC
  Filled 2013-12-24: qty 1

## 2013-12-24 MED ORDER — KETOROLAC TROMETHAMINE 30 MG/ML IJ SOLN
INTRAMUSCULAR | Status: AC
Start: 2013-12-24 — End: 2013-12-24
  Filled 2013-12-24: qty 1

## 2013-12-24 MED ORDER — SODIUM CHLORIDE 0.9 % IJ SOLN
INTRAMUSCULAR | Status: AC
  Filled 2013-12-24: qty 10

## 2013-12-24 MED ORDER — FENTANYL CITRATE 0.05 MG/ML IJ SOLN
INTRAMUSCULAR | Status: DC | PRN
  Administered 2013-12-24 (×7): 50 ug via INTRAVENOUS

## 2013-12-24 MED ORDER — NEOSTIGMINE METHYLSULFATE 10 MG/10ML IV SOLN
INTRAVENOUS | Status: DC | PRN
  Administered 2013-12-24: 4 mg via INTRAVENOUS

## 2013-12-24 MED ORDER — GLYCOPYRROLATE 0.2 MG/ML IJ SOLN
INTRAMUSCULAR | Status: DC | PRN
  Administered 2013-12-24: 0.3 mg via INTRAVENOUS
  Administered 2013-12-24: .7 mg via INTRAVENOUS

## 2013-12-24 MED ORDER — ONDANSETRON HCL 4 MG/2ML IJ SOLN
INTRAMUSCULAR | Status: DC | PRN
  Administered 2013-12-24: 4 mg via INTRAVENOUS

## 2013-12-24 MED ORDER — FENTANYL CITRATE 0.05 MG/ML IJ SOLN
25.0000 ug | INTRAMUSCULAR | Status: DC | PRN
  Administered 2013-12-24: 25 ug via INTRAVENOUS
  Administered 2013-12-24: 50 ug via INTRAVENOUS
  Administered 2013-12-24: 25 ug via INTRAVENOUS

## 2013-12-24 MED ORDER — HYDROMORPHONE HCL PF 1 MG/ML IJ SOLN
INTRAMUSCULAR | Status: DC | PRN
  Administered 2013-12-24: 1 mg via INTRAVENOUS

## 2013-12-24 MED ORDER — DEXTROSE 5 % IV SOLN
3.0000 g | INTRAVENOUS | Status: DC | PRN
  Administered 2013-12-24: 3 g via INTRAVENOUS

## 2013-12-24 MED ORDER — BUPIVACAINE HCL (PF) 0.25 % IJ SOLN
INTRAMUSCULAR | Status: AC
  Filled 2013-12-24: qty 50

## 2013-12-24 MED ORDER — METOCLOPRAMIDE HCL 5 MG/ML IJ SOLN
10.0000 mg | Freq: Once | INTRAMUSCULAR | Status: AC | PRN
  Administered 2013-12-24: 10 mg via INTRAVENOUS

## 2013-12-24 MED ORDER — LIDOCAINE HCL (CARDIAC) 20 MG/ML IV SOLN
INTRAVENOUS | Status: AC
  Filled 2013-12-24: qty 5

## 2013-12-24 MED ORDER — BUPIVACAINE HCL (PF) 0.25 % IJ SOLN
INTRAMUSCULAR | Status: DC | PRN
Start: 2013-12-24 — End: 2013-12-24
  Administered 2013-12-24: 14 mL

## 2013-12-24 MED ORDER — MEPERIDINE HCL 25 MG/ML IJ SOLN: 6.2500 mg | INTRAMUSCULAR | Status: DC | PRN

## 2013-12-24 MED ORDER — LIDOCAINE HCL (CARDIAC) 20 MG/ML IV SOLN
INTRAVENOUS | Status: DC | PRN
  Administered 2013-12-24: 80 mg via INTRAVENOUS

## 2013-12-24 MED ORDER — HYDROMORPHONE HCL PF 1 MG/ML IJ SOLN
INTRAMUSCULAR | Status: AC
  Filled 2013-12-24: qty 1

## 2013-12-24 MED ORDER — GLYCOPYRROLATE 0.2 MG/ML IJ SOLN
INTRAMUSCULAR | Status: AC
Start: 2013-12-24 — End: 2013-12-24
  Filled 2013-12-24: qty 1

## 2013-12-24 MED ORDER — LACTATED RINGERS IV SOLN
INTRAVENOUS | Status: DC
  Administered 2013-12-24 (×3): via INTRAVENOUS

## 2013-12-24 MED ORDER — GLYCOPYRROLATE 0.2 MG/ML IJ SOLN
INTRAMUSCULAR | Status: AC
  Filled 2013-12-24: qty 3

## 2013-12-24 MED ORDER — PROPOFOL 10 MG/ML IV BOLUS
INTRAVENOUS | Status: DC | PRN
  Administered 2013-12-24: 200 mg via INTRAVENOUS

## 2013-12-24 MED ORDER — KETOROLAC TROMETHAMINE 30 MG/ML IJ SOLN
INTRAMUSCULAR | Status: DC | PRN
  Administered 2013-12-24: 30 mg via INTRAVENOUS

## 2013-12-24 MED ORDER — DEXAMETHASONE SODIUM PHOSPHATE 10 MG/ML IJ SOLN
INTRAMUSCULAR | Status: AC
  Filled 2013-12-24: qty 1

## 2013-12-24 MED ORDER — MIDAZOLAM HCL 2 MG/2ML IJ SOLN
INTRAMUSCULAR | Status: DC | PRN
  Administered 2013-12-24: 2 mg via INTRAVENOUS

## 2013-12-24 MED ORDER — ROCURONIUM BROMIDE 100 MG/10ML IV SOLN
INTRAVENOUS | Status: DC | PRN
  Administered 2013-12-24: 10 mg via INTRAVENOUS
  Administered 2013-12-24 (×2): 5 mg via INTRAVENOUS
  Administered 2013-12-24: 10 mg via INTRAVENOUS
  Administered 2013-12-24: 30 mg via INTRAVENOUS

## 2013-12-24 MED ORDER — METOCLOPRAMIDE HCL 5 MG/ML IJ SOLN
INTRAMUSCULAR | Status: AC
  Filled 2013-12-24: qty 2

## 2013-12-24 MED ORDER — PROPOFOL 10 MG/ML IV EMUL
INTRAVENOUS | Status: AC
  Filled 2013-12-24: qty 20

## 2013-12-24 MED ORDER — ONDANSETRON HCL 4 MG/2ML IJ SOLN
INTRAMUSCULAR | Status: AC
  Filled 2013-12-24: qty 2

## 2013-12-24 SURGICAL SUPPLY — 28 items
BARRIER ADHS 3X4 INTERCEED (GAUZE/BANDAGES/DRESSINGS) ×3 IMPLANT
CABLE HIGH FREQUENCY MONO STRZ (ELECTRODE) IMPLANT
CHLORAPREP W/TINT 26ML (MISCELLANEOUS) ×3 IMPLANT
DERMABOND ADVANCED (GAUZE/BANDAGES/DRESSINGS) ×1
DERMABOND ADVANCED .7 DNX12 (GAUZE/BANDAGES/DRESSINGS) ×2 IMPLANT
GLOVE BIO SURGEON STRL SZ 6.5 (GLOVE) ×3 IMPLANT
GLOVE BIOGEL M 6.5 STRL (GLOVE) ×3 IMPLANT
GLOVE BIOGEL PI IND STRL 6.5 (GLOVE) ×4 IMPLANT
GLOVE BIOGEL PI INDICATOR 6.5 (GLOVE) ×2
GOWN STRL REUS W/TWL LRG LVL3 (GOWN DISPOSABLE) ×9 IMPLANT
NS IRRIG 1000ML POUR BTL (IV SOLUTION) ×3 IMPLANT
PACK LAPAROSCOPY BASIN (CUSTOM PROCEDURE TRAY) ×3 IMPLANT
POUCH SPECIMEN RETRIEVAL 10MM (ENDOMECHANICALS) ×3 IMPLANT
PROTECTOR NERVE ULNAR (MISCELLANEOUS) ×3 IMPLANT
SCISSORS LAP 5X35 DISP (ENDOMECHANICALS) IMPLANT
SET IRRIG TUBING LAPAROSCOPIC (IRRIGATION / IRRIGATOR) IMPLANT
SHEARS HARMONIC ACE PLUS 36CM (ENDOMECHANICALS) ×3 IMPLANT
STRIP CLOSURE SKIN 1/4X4 (GAUZE/BANDAGES/DRESSINGS) IMPLANT
SUT MNCRL AB 3-0 PS2 27 (SUTURE) ×3 IMPLANT
SUT VICRYL 0 ENDOLOOP (SUTURE) IMPLANT
SUT VICRYL 0 UR6 27IN ABS (SUTURE) ×6 IMPLANT
SYR 50ML LL SCALE MARK (SYRINGE) IMPLANT
TOWEL OR 17X24 6PK STRL BLUE (TOWEL DISPOSABLE) ×6 IMPLANT
TRAY FOLEY CATH 14FR (SET/KITS/TRAYS/PACK) ×3 IMPLANT
TROCAR XCEL NON-BLD 11X100MML (ENDOMECHANICALS) IMPLANT
TROCAR XCEL NON-BLD 5MMX100MML (ENDOMECHANICALS) ×3 IMPLANT
WARMER LAPAROSCOPE (MISCELLANEOUS) ×3 IMPLANT
WATER STERILE IRR 1000ML POUR (IV SOLUTION) ×3 IMPLANT

## 2013-12-24 NOTE — H&P (View-Only) (Signed)
28 y.o.Married Not Hispanic or Latino female presents for preoperative consult for right ovarian cystectomy and laparoscopy for adnexal mass chronic pelvic pain .pt has had 2 PUS that showed persistence of complex ovarian cyst, questionable dermoid.  Pt was not using contraception  And has had her cycle brought on with provera and is currently taking her ocp. She is still bleeding.  Pt reports nausea and pressure  Pertinent items are noted in HPI. LMP 12/09/2013 General appearance: alert, cooperative and appears stated age Lungs: clear to auscultation bilaterally Heart: regular rate and rhythm, S1, S2 normal, no murmur, click, rub or gallop Abdomen: soft, non-tender; bowel sounds normal; no masses,  no organomegaly Pelvic: cervix normal in appearance, external genitalia normal, no cervical motion tenderness, uterus normal size, shape, and consistency, vagina normal without discharge and tenderness of right adnexa, with fullness, left negative Extremities: extremities normal, atraumatic, no cyanosis or edema Skin: Skin color, texture, turgor normal. No rashes or lesions Neurologic: Grossly normal   The procedure was discussed at length, including the expected post-operative course. We rebviewed her PUS images and pt is aware that our goal is to remove just her cyst but there is a chance that she may loose the ovary as well.  I believe the cyst to be a dermoid but an endometrioma cannot be ruled out.  Pt was counseled of risk of possible conversion to E-Lap with every laparoscopic procedure.  This would extend her time in the hospital.    Pt informed regarding risks and benefits of surgery, including but not limited to bleeding, infections, damage to bowel, bladder and major vessels either during port placement or during the surgical procedure.  Patient was made aware that damage that is seen immediately would be addressed at the time and may require conversion to an open procedure and additional  surgical consults.  In addition some damage is delayed and may require return to the operating room in the future.    Risk of blood transfusion from discussed, including the risk of infection from receiving blood was discussed.  It was recommended that the patient does not donate autologously.  The patientis accept blood products if deemed necessary.  There is a risk of formation of a deep vein thrombus in the extremities was discussed, although PAS will be placed to minimize the risk, the patient was informed that a pulmonary embolism could still form and could result in death.  Early ambulation was stressed.  She was instructed on signs and symptoms to be aware of and the need to call if they should develop.  Pt had a lap cholecystectomy, we will do beneath her umbilicus, in addition, she developed an infection in her incision, she will therefore do a hibiclens wash of her abdomen the day before and morning of OR.   All questions were addressed.  Post-operative medications dilaudid were given to the patient.

## 2013-12-24 NOTE — Op Note (Signed)
NAMESHANISE, BALCH NO.:  [id number]  MEDICAL RECORD NO.:  [id number]  LOCATION:  [facility]                          FACILITY:  [facility]  PHYSICIAN:  Alver Sorrow. Charlies Constable, M.D. DATE OF BIRTH:  March 01, 1986  DATE OF PROCEDURE:  12/24/2013 DATE OF DISCHARGE:  12/24/2013                              OPERATIVE REPORT   PREOPERATIVE DIAGNOSIS:  Complex right ovarian cyst.  POSTOPERATIVE DIAGNOSES:  Complex right ovarian cyst.  Dermoid.  PROCEDURE:  Laparoscopic ovarian cystectomy.  SURGEON:  Alver Sorrow. Charlies Constable, M.D.  ASSISTANT:  Lenard Galloway, M.D.  ANESTHESIA:  General.  IV FLUID:  25 mL of lactated Ringer's.  URINE OUTPUT:  200 mL of clear urine.  ESTIMATED BLOOD LOSS:  Minimal.  INDICATIONS:  The patient is a 28 year old, G1, P1 with right lower quadrant pain who was found to have a complex adnexal cyst that was watched over several months without any resolution, suspicious for a dermoid.  FINDINGS:  Normal-appearing uterus and tubes.  The left ovary was minimally enlarged consistent with polycystic ovaries.  The right ovary was markedly enlarged with distortion of normal ovarian anatomy.  PATHOLOGY:  Ovarian cyst and cyst fluid.  COMPLICATIONS:  None.  DESCRIPTION OF PROCEDURE:  The patient was taken to the operating room, placed in the dorsal lithotomy position, prepped and draped in usual sterile fashion.  Arms were tucked.  Bimanual exam was performed. Bivalve speculum was then placed in the vagina.  The cervix was visualized, stabilized with Hulka clip.  A Foley had been placed. Gloves were then changed and attention was turned to the abdomen.  The patient has had a cholecystectomy prior and because of her abdominal wall, decision was made to make an infraumbilical incision.  The skin was held up with two Allis clamps and the skin was incised with a scalpel.  Blunt dissection was performed.  The Veress needle was then inserted and felt like a normal  entrance into the peritoneal cavity. There was free flow of fluid; however, despite elevating the pannus, there was minimal flow of gas.  The Veress needle was then removed and reinserted second time with the same results.  Because we felt that the weight of the pannus was preventing adequate distention, we then elected to enter bluntly with a reusable 10 trocar, which was inserted through the port and into the pelvis.  There was free flow of gas noted once we had entered and confirmation with the camera.  At this point, gas was put on high flow.  The pneumoperitoneum was created.  The patient was then placed in Trendelenburg position.  The uterus was elevated.  The findings were as noted above.  Two 5-mm ports were made laterally under direct visualization.  All three ports had been injected with Marcaine prior to skin incision.  Using the two 5-mm ports, the ovary was elevated and stabilized. An Harmonic Scalpel was used to incise the serosa of the cyst.  Once the cyst wall was incised the edge was grasped, we began to dissect the cyst off the underlying ovarian tissue with careful attention to the blood supply as well as the tube.  Dissection was carried through areas of  concern were treated with the Harmonic Scalpel and blunt dissection.  The incision was carried through until most of the cyst had been removed from the normal-appearing ovarian tissue.  At this point, the cyst was bluntly dissected a little further and the cyst with serosa was removed sharply.  The cyst was held, the ovary was inspected and felt to be hemostatic.  Suction irrigation was performed.  During the dissection of the cyst, a small rent was made and clear yellow fluid was obtained and a small amount of hair consistent with dermoid.  The right lateral port was then extended to a #10 trocar under direct visualization.  The Endopouch was then advanced through and the cyst was placed in the pouch.  The bag was  brought to the skin edge and the fascial incision was then extended under direct visualization.  The fascia was grasped with Kochers and extended laterally in order to allow the cyst and its bag to be removed en bulk.  The #10 port was then placed back in the incision.  The pelvis again was irrigated.  There was a small amount of bleeding noted from the base of the cyst, that was treated with The monopolar.  reinspection assured [place] hemostasis.  The cyst was then wrapped with Interceed and the ports removed under direct visualization. The fascia on the right lateral port was identified, grasped with Kochers and elevated, it was closed in a running locked fashion. Attention was then turned to the infraumbilical port.  Despite multiple attempts, the fascial defect was unable to be visually identified.  The tissue was gently probed and the fascial defect could not be identified because we felt that this required marked extension of the incision and the port was felt to be naturally closed.  The fascia on the infraumbilical port was not closed.  The subcu on the infraumbilical and right lower quadrant ports was reapproximated with 3-0 plain to prevent seroma formation.  The skin was closed with 3-0 Monocryl and Dermabond was placed.  An additional 10 mL of 0.25% Marcaine was injected in the two larger ports.  The patient tolerated the procedure well.  Sponge, lap, and needle counts were correct x2.  She was extubated in the OR and transferred to the recovery room in stable condition.     Alver Sorrow. Charlies Constable, M.D.     THL/MEDQ  D:  12/24/2013  T:  12/24/2013  Job:  [id number]

## 2013-12-24 NOTE — Anesthesia Postprocedure Evaluation (Signed)
  Anesthesia Post Note  Patient: [name]  Procedure(s) Performed: Procedure(s) (LRB): LAPAROSCOPIC OVARIAN CYSTECTOMY (Right)  Anesthesia type: GA  Patient location: PACU  Post pain: Pain level controlled  Post assessment: Post-op Vital signs reviewed  Last Vitals:  Filed Vitals:   12/24/13 1145  BP: 134/81  Pulse: 105  Temp: 36.8 C  Resp: 17    Post vital signs: Reviewed  Level of consciousness: sedated  Complications: No apparent anesthesia complications

## 2013-12-24 NOTE — Anesthesia Preprocedure Evaluation (Signed)
Anesthesia Evaluation  Patient identified by MRN, date of birth, ID band Patient awake    Reviewed: Allergy & Precautions, H&P , NPO status , Patient's Chart, lab work & pertinent test results  Airway Mallampati: III TM Distance: >3 FB Neck ROM: Full    Dental no notable dental hx. (+) Teeth Intact   Pulmonary neg pulmonary ROS,  breath sounds clear to auscultation  Pulmonary exam normal       Cardiovascular negative cardio ROS  Rhythm:Regular Rate:Normal     Neuro/Psych  Headaches, PSYCHIATRIC DISORDERS Depression    GI/Hepatic Neg liver ROS, GERD-  Controlled,  Endo/Other  Morbid obesityPCOS  Renal/GU negative Renal ROS  negative genitourinary   Musculoskeletal negative musculoskeletal ROS (+)   Abdominal (+) + obese,   Peds  Hematology negative hematology ROS (+)   Anesthesia Other Findings   Reproductive/Obstetrics Complex right ovarian cyst                           Anesthesia Physical Anesthesia Plan  ASA: III  Anesthesia Plan: General   Post-op Pain Management:    Induction: Intravenous  Airway Management Planned: Oral ETT  Additional Equipment:   Intra-op Plan:   Post-operative Plan: Extubation in OR  Informed Consent: I have reviewed the patients History and Physical, chart, labs and discussed the procedure including the risks, benefits and alternatives for the proposed anesthesia with the patient or authorized representative who has indicated his/her understanding and acceptance.   Dental advisory given  Plan Discussed with: Anesthesiologist, CRNA and Surgeon  Anesthesia Plan Comments:         Anesthesia Quick Evaluation

## 2013-12-24 NOTE — Transfer of Care (Signed)
Immediate Anesthesia Transfer of Care Note  Patient: [name]  Procedure(s) Performed: Procedure(s): LAPAROSCOPIC OVARIAN CYSTECTOMY (Right)  Patient Location: PACU  Anesthesia Type:General  Level of Consciousness: awake, alert  and oriented  Airway & Oxygen Therapy: Patient Spontanous Breathing and Patient connected to nasal cannula oxygen  Post-op Assessment: Report given to PACU RN, Post -op Vital signs reviewed and stable and Patient moving all extremities  Post vital signs: Reviewed and stable  Complications: No apparent anesthesia complications

## 2013-12-24 NOTE — Interval H&P Note (Signed)
History and Physical Interval Note:  12/24/2013 8:17 AM  [name]  has presented today for surgery, with the diagnosis of Complex ovarian cyst  The various methods of treatment have been discussed with the patient and family. After consideration of risks, benefits and other options for treatment, the patient has consented to  Procedure(s): LAPAROSCOPIC OVARIAN CYSTECTOMY (Right) LAPAROSCOPIC SALPINGO OOPHORECTOMY (Right) as a surgical intervention .  The patient's history has been reviewed, patient examined, no change in status, stable for surgery.  I have reviewed the patient's chart and labs.  Questions were answered to the patient's satisfaction.     Azalia Bilis

## 2013-12-24 NOTE — Brief Op Note (Signed)
12/24/2013  11:07 AM  PATIENT:  [name]  28 y.o. female  PRE-OPERATIVE DIAGNOSIS:  Complex ovarian cyst  POST-OPERATIVE DIAGNOSIS:  Complex ovarian cyst  PROCEDURE:  Procedure(s): LAPAROSCOPIC OVARIAN CYSTECTOMY (Right)  SURGEON:  Surgeon(s) and Role:    * Azalia Bilis, MD - Primary    * [place] Berton Lan, MD - Assisting  PHYSICIAN ASSISTANT:   ASSISTANTS: none   ANESTHESIA:   general  EBL:  Total I/O In: 2500 [I.V.:2500] Out: 220 [Urine:200; Blood:20]  BLOOD ADMINISTERED:none  DRAINS: none   LOCAL MEDICATIONS USED:  MARCAINE    and Amount: 14 ml  SPECIMEN:  Source of Specimen:  right ovary  DISPOSITION OF SPECIMEN:  PATHOLOGY  COUNTS:  YES  TOURNIQUET:  * No tourniquets in log *  DICTATION: .Other Dictation: Dictation Number [id number]  PLAN OF CARE: Discharge to home after PACU  PATIENT DISPOSITION:  PACU - hemodynamically stable.   Delay start of Pharmacological VTE agent (>24hrs) due to surgical blood loss or risk of bleeding: not applicable

## 2013-12-24 NOTE — Discharge Instructions (Addendum)

## 2013-12-25 ENCOUNTER — Ambulatory Visit (INDEPENDENT_AMBULATORY_CARE_PROVIDER_SITE_OTHER): Payer: BC Managed Care – PPO | Admitting: Obstetrics and Gynecology

## 2013-12-25 ENCOUNTER — Telehealth: Payer: Self-pay | Admitting: Gynecology

## 2013-12-25 ENCOUNTER — Telehealth: Payer: Self-pay | Admitting: *Deleted

## 2013-12-25 ENCOUNTER — Encounter: Payer: Self-pay | Admitting: Obstetrics and Gynecology

## 2013-12-25 VITALS — BP 124/86 | HR 80 | Temp 99.1°F | Ht 64.25 in | Wt 255.6 lb

## 2013-12-25 DIAGNOSIS — T8131XA Disruption of external operation (surgical) wound, not elsewhere classified, initial encounter: Secondary | ICD-10-CM

## 2013-12-25 LAB — CBC WITH DIFFERENTIAL/PLATELET
Basophils Absolute: 0 10*3/uL (ref 0.0–0.1)
Basophils Relative: 0 % (ref 0–1)
Eosinophils Absolute: 0 10*3/uL (ref 0.0–0.7)
Eosinophils Relative: 0 % (ref 0–5)
HCT: 34.6 % — ABNORMAL LOW (ref 36.0–46.0)
Hemoglobin: 11.5 g/dL — ABNORMAL LOW (ref 12.0–15.0)
LYMPHS ABS: 5 10*3/uL — AB (ref 0.7–4.0)
Lymphocytes Relative: 36 % (ref 12–46)
MCH: 27.6 pg (ref 26.0–34.0)
MCHC: 33.2 g/dL (ref 30.0–36.0)
MCV: 83 fL (ref 78.0–100.0)
Monocytes Absolute: 0.8 10*3/uL (ref 0.1–1.0)
Monocytes Relative: 6 % (ref 3–12)
NEUTROS ABS: 8.1 10*3/uL — AB (ref 1.7–7.7)
NEUTROS PCT: 58 % (ref 43–77)
PLATELETS: 257 10*3/uL (ref 150–400)
RBC: 4.17 MIL/uL (ref 3.87–5.11)
RDW: 13.5 % (ref 11.5–15.5)
WBC: 14 10*3/uL — ABNORMAL HIGH (ref 4.0–10.5)

## 2013-12-25 MED ORDER — FLUCONAZOLE 150 MG PO TABS: 150.0000 mg | ORAL_TABLET | Freq: Once | ORAL | Status: DC

## 2013-12-25 MED ORDER — CEPHALEXIN 500 MG PO CAPS: 500.0000 mg | ORAL_CAPSULE | Freq: Two times a day (BID) | ORAL | Status: DC

## 2013-12-25 NOTE — Telephone Encounter (Signed)
Tc to see how pt doing after surgery, lm

## 2013-12-25 NOTE — Telephone Encounter (Signed)
Call from patient's husband Cristie Hem, concerned about patient's umbilical incision.  States that glue has come off and incision is open and draining.  Drainage is small amount of clear/blood tinge fluid.  No redness or swelling and no fever.  Dr lathrop out of office today.  Dr Quincy Simmonds who assisted with case will see patient.  Husband notified of appt today at 1245 and agreeable.  Routing to provider for final review. Patient agreeable to disposition. Will close encounter

## 2013-12-25 NOTE — Progress Notes (Signed)
Patient ID: [name], female   DOB: 1985/11/10, 28 y.o.   MRN: [id number] GYNECOLOGY  VISIT   HPI: 28 y.o.   Married  Caucasian  female   G1P1001 with Patient's last menstrual period was 12/09/2013.   here for incision check--had right laparoscopic ovarian cystectomy 12-24-13 and scratchy throat since surgery yesterday.  Some right sided pain.  No dilaudid use.  Umbilical incision opened and having mucousy drainage.  Dermabond peeled off last hs.   Sore throat and phlegm from tube.   No BM yet.  Tolerating food well.  No nausea.  Voiding well.  Received Ancef as antibiotic prophylaxis for surgery.   Family allergy to PCN.  Patient states she did not have an allergy to this ever.   Patient had umbilical incisional infection following laparoscopic cholecystectomy.   GYNECOLOGIC HISTORY: Patient's last menstrual period was 12/09/2013. Contraception: OCP's.   Menopausal hormone therapy: n/a        OB History   Grav Para Term Preterm Abortions TAB SAB Ect Mult Living   1 1 1       1          Patient Active Problem List   Diagnosis Date Noted  . Post-operative state 12/24/2013  . Amenorrhea 11/19/2013  . PCOS (polycystic ovarian syndrome) 09/30/2013  . Complex ovarian cyst 09/30/2013  . Migraines   . Depression     Past Medical History  Diagnosis Date  . PCOS (polycystic ovarian syndrome)   . Migraines   . Depression   . Infertility, female     clomid  . Stomach problems     (migraines or spasms)    Past Surgical History  Procedure Laterality Date  . Gallbladder surgery    . Wisdom tooth extraction    . Upper gi endoscopy  2011  . Colonoscopy  2011    Current Outpatient Prescriptions  Medication Sig Dispense Refill  . FLUoxetine (PROZAC) 20 MG capsule Take 1 capsule (20 mg total) by mouth daily.  90 capsule  1  . Ibuprofen (MOTRIN PO) Take 800 mg by mouth as needed (prn for pain or headache).       . norethindrone-ethinyl estradiol (JUNEL FE,GILDESS  FE,LOESTRIN FE) 1-20 MG-MCG tablet Take 1 tablet by mouth daily.  3 Package  3  . Acetaminophen (TYLENOL PO) Take 650 mg by mouth as needed (pain, headache).       . calcium carbonate (TUMS - DOSED IN MG ELEMENTAL CALCIUM) 500 MG chewable tablet Chew 2 tablets by mouth daily as needed for indigestion or heartburn.      Marland Kitchen HYDROmorphone (DILAUDID) 2 MG tablet Take 1 tablet (2 mg total) by mouth every 4 (four) hours as needed.  20 tablet  0  . Multiple Vitamins-Minerals (MULTIVITAMIN PO) Take 1 tablet by mouth daily.        No current facility-administered medications for this visit.     ALLERGIES: Penicillins  Family History  Problem Relation Age of Onset  . Anesthesia problems Neg Hx   . Hypotension Neg Hx   . Malignant hyperthermia Neg Hx   . Pseudochol deficiency Neg Hx   . Thyroid disease Mother   . Hyperlipidemia Father   . Cancer Father     prostate cancer  . Breast cancer Maternal Grandmother   . Cancer Maternal Grandmother     bone cancer  . Diabetes Maternal Grandfather     History   Social History  . Marital Status: Married    Spouse  Name: N/A    Number of Children: N/A  . Years of Education: N/A   Occupational History  . Not on file.   Social History Main Topics  . Smoking status: Never Smoker   . Smokeless tobacco: Never Used  . Alcohol Use: No  . Drug Use: No  . Sexual Activity: Yes    Partners: Male    Birth Control/ Protection: Pill     Comment: mini pill   Other Topics Concern  . Not on file   Social History Narrative  . No narrative on file    ROS:  Pertinent items are noted in HPI.  PHYSICAL EXAMINATION:    BP 124/86  Pulse 80  Temp(Src) 99.1 F (37.3 C) (Oral)  Ht 5' 4.25" (1.632 m)  Wt 255 lb 9.6 oz (115.939 kg)  BMI 43.53 kg/m2  LMP 12/09/2013     General appearance: alert, cooperative and appears stated age Lungs: clear to auscultation bilaterally Heart: regular rate and rhythm Abdomen:  Obese with pannus. Umbilical incision  open with subq intact.  Mild amount of bloody mucous present.  Mildly tender.  No guarding or rebound.  Right lower quadrant incision and left lower quadrant incisions intact.    Procedure - suturing of the umbilical incision.  Verbal consent for the procedure. Sterile prep with Hibiclens.  Dermabond pieces removed. Lidocaine 2% 3 cc of lot # [id number], expiraiton 08/07/14. 3 subcuticular sutures of vicryl 3/0 placed in skin to close the incision.  Steristrips and benzoin placed.  Sterile ABD and tape placed.  Right lower quadrant incision also covered with sterile gauze and tape.  No complications.   ASSESSMENT  Umbilical wound separation.  Low grade temp.   PLAN  CBC with differential.  Keflex 500 mg po bid for 7 days.  See orders.  Diflucan 150 mg po for yeast vaginitis if needed.  See orders.  Decreased activity.  Follow up in 5 days for incision check with Dr. Charlies Constable.     An After Visit Summary was printed and given to the patient.

## 2013-12-26 ENCOUNTER — Telehealth: Payer: Self-pay | Admitting: Gynecology

## 2013-12-26 NOTE — Telephone Encounter (Signed)
Spoke with the patient's husband last night at 8:20 PM whereby patient was one day post laparoscopic right ovarian cystectomy for a dermoid cyst. She had returned back to Dr. Brion Aliment the day after surgery with concern of separation of the umbilicus incision and Dermabond glue. Since she was out of the office her partner Dr. Quincy Simmonds clean the area and placed a few stitches and put on Keflex. The reason for the call last night was that patient had stated that her right arm felt warmer than her left she had no reported neuropathy and had good circulation on the nailbed according to her husband. Patient did not have an IV in on that arm. She was offered to come to the hospital to be examined or to wait for the morning and perhaps hold off on the second dose of the Keflex that she had taken in the event that she was early developing a reaction from that. I called the patient is morning spoke with her husband stated she had a good night the right arm is not read was warm so was the day before. She had some incisional discomfort as to be expected and has remained afebrile. She was offered to come by the office today if any concerns otherwise she will follow up for routine postop appointment with Dr. Charlies Constable in 2 weeks.

## 2013-12-30 ENCOUNTER — Ambulatory Visit (INDEPENDENT_AMBULATORY_CARE_PROVIDER_SITE_OTHER): Payer: BC Managed Care – PPO | Admitting: Gynecology

## 2013-12-30 ENCOUNTER — Telehealth: Payer: Self-pay | Admitting: Emergency Medicine

## 2013-12-30 VITALS — BP 118/78 | HR 72 | Resp 16 | Ht 64.25 in | Wt 252.0 lb

## 2013-12-30 DIAGNOSIS — T8131XA Disruption of external operation (surgical) wound, not elsewhere classified, initial encounter: Secondary | ICD-10-CM

## 2013-12-30 NOTE — Telephone Encounter (Signed)
Spoke with patient. Results and message given as seen below.Patient states that she had an appointment with Dr.Lathrop today at 1:00pm. States that Dr.Lathrop took a look at her arm and she will continue to take antibiotics until Thursday when her course is complete. Patient states that she does not have any further questions or concerns at this time but will call back if she does.  CC: Dr.Silva  Routing to provider for final review. Patient agreeable to disposition. Will close encounter.

## 2013-12-30 NOTE — Progress Notes (Signed)
Subjective:     Patient ID: [name], female   DOB: 09-16-1985, 28 y.o.   MRN: [id number]  HPI Comments: Pt here 1w after laparoscopic right ovarian cystectomy.  Pt's umbilical port opened post-op and she had steris, sutures placed and is here for f/u and suture removal.  Pt states that she inadvertently removed her sutures form the umbilicus and right lower port.  She has only been taking motrin as needed and reports normal bowel and bladder function since OR.  She has not been lifting her daughter who is 28#.    Review of Systems  Constitutional: Positive for fatigue (imrpoving, but poor sleep since OR). Negative for fever and chills.       Objective:   Physical Exam  Nursing note and vitals reviewed. Constitutional: She appears well-developed and well-nourished.  Abdominal: Soft. She exhibits no distension and no mass. There is no tenderness. There is no rebound and no guarding.         Assessment:     Post-op check  Umbilical dehsicence     Plan:     Operative finding reviewed, final pathology pending, cytology normal To finish Abx F/u 2w for pelvic

## 2013-12-30 NOTE — Telephone Encounter (Signed)
Message left to return call to [place] at [phone].  To check status.

## 2013-12-30 NOTE — Telephone Encounter (Signed)
Message copied by Michele Mcalpine on Tue Dec 30, 2013  1:03 PM ------      Message from: Judeth Horn DE Berton Lan, BROOK E      Created: Fri Dec 26, 2013 10:50 AM       Please contact patient with lab result.            Patient seen in office yesterday for umbilical wound separation which I sutured.      She started on Keflex yesterday and called Dr. Toney Rakes, who was on call, regarding her arm being red.       He spoke with patient or her husband today, and her arm was better.             Please have her finish her antibiotics.            If she has any further concerns today, she will need to see Dr. Charlies Constable. ------

## 2013-12-31 ENCOUNTER — Other Ambulatory Visit: Payer: Self-pay | Admitting: Gynecology

## 2013-12-31 ENCOUNTER — Telehealth: Payer: Self-pay | Admitting: Gynecology

## 2013-12-31 DIAGNOSIS — D3A098 Benign carcinoid tumors of other sites: Secondary | ICD-10-CM

## 2013-12-31 NOTE — Telephone Encounter (Signed)
Pathology received today, LM for pt to call back to discuss.  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  D/w Heme/Onc-Dr Magrinat, ok to refer recommends Dr Marko Plume to rule out any further disease. Referral dropped

## 2013-12-31 NOTE — Telephone Encounter (Signed)
Spoke to pt, informed re carcinoid, questions addressed aware of referral of Onc, mailed up-to-dat info

## 2013-12-31 NOTE — Telephone Encounter (Signed)
Pt caled back, explained carcinoid noted in ovary, referral to Dr Marko Plume.  Enclosed Up-To-Date info on carcinoid.questions addressed

## 2014-01-01 ENCOUNTER — Telehealth: Payer: Self-pay | Admitting: Oncology

## 2014-01-01 NOTE — Telephone Encounter (Signed)
C/D 01/01/14 for appt. 01/14/14 °

## 2014-01-01 NOTE — Telephone Encounter (Signed)
S/W PATIENT AND GAVE NP APPT FOR 06/10 @ 3 W/DR. LIVESAY.  REFERRING DR. 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

## 2014-01-07 ENCOUNTER — Ambulatory Visit (INDEPENDENT_AMBULATORY_CARE_PROVIDER_SITE_OTHER): Payer: BC Managed Care – PPO | Admitting: Gynecology

## 2014-01-07 VITALS — BP 96/74 | Resp 12 | Ht 64.25 in | Wt 254.0 lb

## 2014-01-07 DIAGNOSIS — D489 Neoplasm of uncertain behavior, unspecified: Secondary | ICD-10-CM | POA: Insufficient documentation

## 2014-01-07 DIAGNOSIS — D3A098 Benign carcinoid tumors of other sites: Secondary | ICD-10-CM

## 2014-01-07 DIAGNOSIS — D369 Benign neoplasm, unspecified site: Secondary | ICD-10-CM

## 2014-01-07 HISTORY — DX: Benign carcinoid tumors of other sites: D3A.098

## 2014-01-07 NOTE — Progress Notes (Signed)
Subjective:     Patient ID: [name], female   DOB: 1986-07-04, 28 y.o.   MRN: [id number]  HPI Comments: 2w post-op s/p right ovarian cystectomy for dermoid and portion of carcinoid tumor noted.  Pt mailed information sent and has appt with Hem/Onc next week.  Pt is without complaints from surgery.  She is unsure if her flushing has returned or if she is over thinking it.  Bleeding lightly, no pain    Review of Systems Per hpi    Objective:   Physical Exam  Nursing note and vitals reviewed. Constitutional: She is oriented to person, place, and time. She appears well-developed and well-nourished.  Abdominal:  incisions healed, non tender, no rebound or guarding  Neurological: She is alert and oriented to person, place, and time.  Skin: Skin is warm and dry.   Pelvic: External genitalia:  no lesions              Urethra:  normal appearing urethra with no masses, tenderness or lesions              Bartholins and Skenes: normal                 Vagina: normal appearing vagina with normal color and discharge, no lesions              Cervix: normal appearance                     Bimanual Exam:  Uterus:  uterus is normal size, shape, consistency and nontender                                      Adnexa: normal adnexa in size, nontender and no masses                                          Assessment:     Post-op ovarian cystectomy-carcinoid tumor     Plan:     Pt will f/u with hem/onc Questions addressed Restrictions lifted

## 2014-01-10 ENCOUNTER — Other Ambulatory Visit: Payer: Self-pay | Admitting: Oncology

## 2014-01-10 DIAGNOSIS — C7A098 Malignant carcinoid tumors of other sites: Secondary | ICD-10-CM

## 2014-01-14 ENCOUNTER — Telehealth: Payer: Self-pay | Admitting: Oncology

## 2014-01-14 ENCOUNTER — Ambulatory Visit (HOSPITAL_BASED_OUTPATIENT_CLINIC_OR_DEPARTMENT_OTHER): Payer: BC Managed Care – PPO | Admitting: Oncology

## 2014-01-14 ENCOUNTER — Encounter: Payer: Self-pay | Admitting: Oncology

## 2014-01-14 ENCOUNTER — Other Ambulatory Visit (HOSPITAL_BASED_OUTPATIENT_CLINIC_OR_DEPARTMENT_OTHER): Payer: BC Managed Care – PPO

## 2014-01-14 ENCOUNTER — Ambulatory Visit: Payer: BC Managed Care – PPO

## 2014-01-14 VITALS — BP 121/81 | HR 99 | Temp 99.3°F | Resp 17 | Ht 64.25 in | Wt 252.9 lb

## 2014-01-14 DIAGNOSIS — C7A098 Malignant carcinoid tumors of other sites: Secondary | ICD-10-CM

## 2014-01-14 DIAGNOSIS — D3A098 Benign carcinoid tumors of other sites: Secondary | ICD-10-CM

## 2014-01-14 LAB — COMPREHENSIVE METABOLIC PANEL (CC13)
ALT: 16 U/L (ref 0–55)
ANION GAP: 8 meq/L (ref 3–11)
AST: 14 U/L (ref 5–34)
Albumin: 3.5 g/dL (ref 3.5–5.0)
Alkaline Phosphatase: 57 U/L (ref 40–150)
BUN: 12.9 mg/dL (ref 7.0–26.0)
CALCIUM: 9.4 mg/dL (ref 8.4–10.4)
CHLORIDE: 105 meq/L (ref 98–109)
CO2: 29 meq/L (ref 22–29)
CREATININE: 0.8 mg/dL (ref 0.6–1.1)
Glucose: 109 mg/dl (ref 70–140)
Potassium: 3.9 mEq/L (ref 3.5–5.1)
Sodium: 142 mEq/L (ref 136–145)
Total Bilirubin: 0.23 mg/dL (ref 0.20–1.20)
Total Protein: 7.2 g/dL (ref 6.4–8.3)

## 2014-01-14 LAB — CBC WITH DIFFERENTIAL/PLATELET
BASO%: 0.2 % (ref 0.0–2.0)
BASOS ABS: 0 10*3/uL (ref 0.0–0.1)
EOS ABS: 0.1 10*3/uL (ref 0.0–0.5)
EOS%: 1.2 % (ref 0.0–7.0)
HEMATOCRIT: 38.6 % (ref 34.8–46.6)
HEMOGLOBIN: 12.6 g/dL (ref 11.6–15.9)
LYMPH#: 4.4 10*3/uL — AB (ref 0.9–3.3)
LYMPH%: 37.1 % (ref 14.0–49.7)
MCH: 27.4 pg (ref 25.1–34.0)
MCHC: 32.6 g/dL (ref 31.5–36.0)
MCV: 83.8 fL (ref 79.5–101.0)
MONO#: 0.6 10*3/uL (ref 0.1–0.9)
MONO%: 4.7 % (ref 0.0–14.0)
NEUT%: 56.8 % (ref 38.4–76.8)
NEUTROS ABS: 6.7 10*3/uL — AB (ref 1.5–6.5)
Platelets: 258 10*3/uL (ref 145–400)
RBC: 4.61 10*6/uL (ref 3.70–5.45)
RDW: 13 % (ref 11.2–14.5)
WBC: 11.9 10*3/uL — AB (ref 3.9–10.3)

## 2014-01-14 NOTE — Progress Notes (Signed)
Checked in new pt with no financial concerns. °

## 2014-01-14 NOTE — Progress Notes (Signed)
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   Patient has polycystic ovarian disease and presented with a few months of symptoms which were similar to those with an unexpected pregnancy ~ 3 years ago  (nausea, fatigue, "prominent veins" chest and UE). She was found to have a complex right ovarian cyst, thought likely a dermoid cyst, which did not resolve by serial ultrasounds over next few months. She went to laparoscopic right ovarian cystectomy by Dr Charlies Constable on 12-24-2013. Findings at surgery were on normal appearing uterus and tubes, minimally enlarged left ovary consistent with PCO and right ovary markedly enlarged with distortion of anatomy. The right cystic area was removed without requiring right oophorectomy. Pathology([id number]) found mature teratoma with associated 0.25 cm carcinoid tumor; no grade of the carcinoid given in path report. Patient had no significant post operative complications.   REVIEW OF SYSTEMS as above, also: No frank severe or symptomatic flushes, tho others comment on mild flushing at times. Loose stools since cholecystectomy in 2009, with BM x3 today "stomach issues all of my life". Intermittent nausea. No wheezing.Some HAs, previously much better with chiropractic intervention, respond also to ibuprofen and essential oils. Good visual acuity, no hearing problems. No sinus or dental symptoms. No known thyroid disease. Has GERD especially at hs. No arhritis. No bladder symptoms. Does not have regular menses; given Provera in 12-2013  "bled for 4 weeks". Intermittent difficulty swallowing, known Shotsky's ring.  Remainder of full 10 point review of systems negative.   ALLERGIES: Penicillins  PAST MEDICAL/ SURGICAL HISTORY:    Polycystic ovarian disease G1P1, daughter age 33.5 years Endoscopy in childhood at [facility] Colonoscopy and shotsky's ring dilatation D.Jacobs 07-2010 Cholecystectomy 2009  CURRENT MEDICATIONS: reviewed as listed now in EMR  [place]:  Originally from [place], graduated from [employer]. No tobacco, no drugs, no transfusions. Stays home with young daughter, Married  FAMILY HISTORY:  Mother thyroidectomy Father prostate ca and CAD Daughter healthy           PHYSICAL EXAM:  height is 5' 4.25" (1.632 m) and weight is 252 lb 14.4 oz (114.715 kg). Her oral temperature is 99.3 F (37.4 C). Her blood pressure is 121/81 and her pulse is 99. Her respiration is 17 and oxygen saturation is 96%.  Alert, pleasant, cooperative lady, looks stated age, NAD, good historian. Central obesity.   HEENT: normal hair pattern, PERRL, not icteric. Oral mucosa moist and clear, no obvious dental problems. Neck supple without JVD or thyroid mass  RESPIRATORY: lungs clear to A and P, no wheezing.  CARDIAC/ VASCULAR:heart RRR no gallop, clear heart sounds. Peripheral pulses symmetrical, intact  ABDOMEN: obese, soft, nontender, normally active BS. Laparoscopic incisions healing well.  LYMPH NODES: no cervical, supraclavicular, axillary or inguinal adenopathy  BREASTS: bilaterally without dominant mass, skin or nipple findings. No abnormality noted with areola bilaterally.  NEUROLOGIC: CN, motor, sensory, cerebellar nonfocal  SKIN: fair complexion, no ecchymosis, rash, petechiae. Veins minimally noticeable UE and upper thorax, does not suggest SVC syndrome. No marked flushing during all of visit.  MUSCULOSKELETAL: extremities without edema, cords, tenderness. Back not tender.    LABORATORY  DATA:  Results for orders placed in visit on 01/14/14 (from the past 48 hour(s))  CBC WITH DIFFERENTIAL     Status: Abnormal   Collection Time    01/14/14  3:16 PM      Result Value Ref Range   WBC 11.9 (*) 3.9 - 10.3 10e3/uL   NEUT# 6.7 (*) 1.5 - 6.5 10e3/uL   HGB 12.6  11.6 - 15.9 g/dL   HCT 38.6  34.8 - 46.6 %   Platelets 258  145 - 400 10e3/uL   MCV 83.8  79.5 - 101.0 fL   MCH 27.4  25.1 - 34.0 pg   MCHC 32.6  31.5 - 36.0 g/dL   RBC 4.61  3.70 - 5.45 10e6/uL   RDW 13.0  11.2 - 14.5 %   lymph# 4.4 (*) 0.9 - 3.3 10e3/uL   MONO# 0.6  0.1 - 0.9 10e3/uL   Eosinophils Absolute 0.1  0.0 - 0.5 10e3/uL   Basophils Absolute 0.0  0.0 - 0.1 10e3/uL   NEUT% 56.8  38.4 - 76.8 %   LYMPH% 37.1  14.0 - 49.7 %   MONO% 4.7  0.0 - 14.0 %   EOS% 1.2  0.0 - 7.0 %   BASO% 0.2  0.0 - 2.0 %  COMPREHENSIVE METABOLIC PANEL (LF81)     Status: None   Collection Time    01/14/14  3:16 PM      Result Value Ref Range   Sodium 142  136 - 145 mEq/L   Potassium 3.9  3.5 - 5.1 mEq/L   Chloride 105  98 - 109 mEq/L   CO2 29  22 - 29 mEq/L   Glucose 109  70 - 140 mg/dl   BUN 12.9  7.0 - 26.0 mg/dL   Creatinine 0.8  0.6 - 1.1 mg/dL   Total Bilirubin 0.23  0.20 - 1.20 mg/dL   Alkaline Phosphatase 57  40 - 150 U/L   AST 14  5 - 34 U/L   ALT 16  0 - 55 U/L   Total Protein 7.2  6.4 - 8.3 g/dL   Albumin 3.5  3.5 - 5.0 g/dL   Calcium 9.4  8.4 - 10.4 mg/dL   Anion Gap 8  3 - 11 mEq/L      PATHOLOGY: ANEA, FODERA Collected: 12/24/2013 Client: [facility] Accession: [id number] Received: 12/24/2013 Elveria Rising, MDRT OF SURGICAL PATHOLOGY FINAL DIAGNOSIS Diagnosis Ovary, cyst, right - 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 - SEE COMMENT. Microscopic Comment The tumor cells are positive for chromogranin, synaptophysin, and CD56. They are negative for p63. The case was discussed with Dr. Charlies Constable on 12/31/2013. Enid Cutter MD Pathologist, Electronic  Signature (Case signed 12/31/2013) Specimen Gross and Clinical Information Specimen(s) Obtained: Ovary, cyst, right Specimen Clinical Information Complex ovarian cyst (je) Gross Received in formalin is a 5 x 3.2 x 3 cm previously incised tan-red thin-walled cyst. The cyst contains tan-yellow sebaceous material and hair. The lining is smooth and partially covered by hair-bearing skin. There is a 2 cm raised area, which on sectioning shows soft yellow adipose tissue.  RADIOGRAPHY: No results found.     DISCUSSION: we have discussed all of history as above and  pathology information. I have explained that primary ovarian carcinoids often arise in cystic teratomas or dermoid tumors, and are usually unilateral and usually localized to ovary; the ovarian carcinoids have better  prognosis if they are not associated with germ cell tumor. Metastatic potential of carcinoid tumors is related to size, grade and location, and obviously this tumor was very tiny at 2.5 mm; in other areas where carcinoids are much more common (such as bowel, lung, rectum) tumors < 1-2 cm are not of concerning size. I have explained usual marked flushing associated with carcinoid syndrome; I cannot tell that her GI complaints suggest carcinoid diarrhea.  We have discussed obtaining 24 hour urine for 5HIAA, which hopefully will confirm no evidence of carcinoid syndrome, vs just this discussion and observation. Patient would like to do the 24 hour urine collection and I will see her back after that is resulted.     IMPRESSION / PLAN:   1. 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 History to me does not suggest carcinoid syndrome, however we will check 24 hour urine for 5HIAA to confirm 2.polycystic ovarian syndrome 3.post cholecystectomy 4.esophageal stricture previously dilated, again symptomatic and I have encouraged her to follow up with GI 5.previous upper and lower endoscopies back  to childhood. 6.obesity, BMI 43   Patient and husband have had questions answered to their satisfaction and are in agreement with plan above. They can contact this office for questions or concerns at any time prior to next scheduled visit.  Time spent 40 min, including >50% discussion and coordination of care.  No antiemetics, careplan or consent needed for this consultation   Gordy Levan, MD 01/14/2014 8:35 PM

## 2014-01-14 NOTE — Telephone Encounter (Signed)
per pof to sch pt appt-pt to do urine before next appt-pt states will come back 6/11 to get cup-lab closed-gave pt copy of [place]

## 2014-01-15 ENCOUNTER — Encounter: Payer: Self-pay | Admitting: Gynecology

## 2014-01-15 ENCOUNTER — Encounter: Payer: Self-pay | Admitting: Oncology

## 2014-01-15 NOTE — Telephone Encounter (Signed)
Patient notified via phone call the 5HIAA test is sent out and takes 5 to 7 business days to receive results.  Reports she has picked up the bottle and probably get the sample to out office Tuesday of next week.  Assured her all factors will be taken into consideration for the results of this test however the question of serotonin levels from PCOS and depression will be reviewed by Dr, Marko Plume upon her return tomorrow.  No further questions or needs at this time.

## 2014-01-16 MED ORDER — FLUOXETINE HCL 10 MG PO TABS: 10.0000 mg | ORAL_TABLET | Freq: Every day | ORAL | Status: DC

## 2014-01-16 NOTE — Telephone Encounter (Signed)
i would drop down to 10mg /d and I can send that in, would stay on that for about 2w, you can either stop after that or alternate days for 1w.  If you do not feel well stopping, stay on 10 and let me know, I sent in 30 with a refill. How did the appt with onc go?

## 2014-01-16 NOTE — Telephone Encounter (Signed)
Spoke with patient. Advised of message and advice from Dr.Lathrop. Patient agreeable. Will start to taper medication and will call back if she would like to stay on 10mg . Patient states that onc appointment went well and that the doctor was very positive and helpful. Will do a 24 hour urine starting Monday. Advised would let Dr.Lathrop know. Patient agreeable and verbalizes understanding.  Routing to provider for final review. Patient agreeable to disposition. Will close encounter

## 2014-01-19 ENCOUNTER — Telehealth: Payer: Self-pay

## 2014-01-19 NOTE — Telephone Encounter (Signed)
Ms. Posada called stating that is accidentally had some banana and ketchup yesterday and she was to not take this food in 72 hours prior to collection. Told Ms. Kasparek that our lab director said to wait another 72 hours prior to collection.=01-22-14.  She will bring specimen in on 01-23-14.  The turn around time is 5-7 days for results so 02-11-14 appointment with Dr. Marko Plume does not need adjusting.

## 2014-01-20 ENCOUNTER — Encounter: Payer: Self-pay | Admitting: Gynecology

## 2014-01-21 ENCOUNTER — Encounter: Payer: Self-pay | Admitting: Gynecology

## 2014-01-21 ENCOUNTER — Ambulatory Visit (INDEPENDENT_AMBULATORY_CARE_PROVIDER_SITE_OTHER): Payer: BC Managed Care – PPO | Admitting: Gynecology

## 2014-01-21 VITALS — BP 110/72 | HR 80 | Temp 98.9°F | Ht 64.25 in | Wt 253.0 lb

## 2014-01-21 DIAGNOSIS — Z4802 Encounter for removal of sutures: Secondary | ICD-10-CM

## 2014-01-21 DIAGNOSIS — D3A098 Benign carcinoid tumors of other sites: Secondary | ICD-10-CM

## 2014-01-21 NOTE — Progress Notes (Signed)
Pt called reporting a suture coming out of her umbilical port.  No fever or chills Has not completed her 24h urine collection for carcinoid yet, due to dietary issue, is recollecting.  Mid-umbillical incision with knot prtruding. Lidocaine jelly placed Suture removed  Lower port without evidence of suture  A/P: Suture removal Carcinoid currently being evaluated

## 2014-01-21 NOTE — Telephone Encounter (Signed)
Call to patient and scheduled appointment for 330 today for Dr Charlies Constable to check stitch.  Routing to Tanae Petrosky for final review. Patient agreeable to disposition. Will close encounter

## 2014-01-28 LAB — 5 HIAA, QUANTITATIVE, URINE, 24 HOUR
5-HIAA, 24 Hr Urine: 2.8 mg/24 h (ref <=6.0)
Volume, Urine-5HIAA: 1400 mL/24 h

## 2014-01-29 ENCOUNTER — Telehealth: Payer: Self-pay

## 2014-01-29 NOTE — Telephone Encounter (Signed)
Told [name] the results of the urine 5HIAA as noted below by Dr. Marko Plume.

## 2014-01-29 NOTE — Telephone Encounter (Signed)
Message copied by Baruch Merl on Thu Jan 29, 2014 11:43 AM ------      Message from: Gordy Levan      Created: Wed Jan 28, 2014 12:37 PM       Labs seen and need follow up: please let her know the urine 5HIAA was normal. ------

## 2014-02-04 ENCOUNTER — Ambulatory Visit (INDEPENDENT_AMBULATORY_CARE_PROVIDER_SITE_OTHER): Payer: BC Managed Care – PPO | Admitting: Gynecology

## 2014-02-04 ENCOUNTER — Encounter: Payer: Self-pay | Admitting: Gynecology

## 2014-02-04 VITALS — BP 108/60 | Resp 12 | Ht 64.25 in | Wt 249.0 lb

## 2014-02-04 DIAGNOSIS — D3A098 Benign carcinoid tumors of other sites: Secondary | ICD-10-CM

## 2014-02-04 DIAGNOSIS — Z0000 Encounter for general adult medical examination without abnormal findings: Secondary | ICD-10-CM

## 2014-02-04 DIAGNOSIS — N912 Amenorrhea, unspecified: Secondary | ICD-10-CM

## 2014-02-04 DIAGNOSIS — E282 Polycystic ovarian syndrome: Secondary | ICD-10-CM

## 2014-02-04 DIAGNOSIS — Z01419 Encounter for gynecological examination (general) (routine) without abnormal findings: Secondary | ICD-10-CM

## 2014-02-04 DIAGNOSIS — Z124 Encounter for screening for malignant neoplasm of cervix: Secondary | ICD-10-CM

## 2014-02-04 LAB — POCT URINALYSIS DIPSTICK
Leukocytes, UA: NEGATIVE
PH UA: 5
Urobilinogen, UA: NEGATIVE

## 2014-02-04 NOTE — Patient Instructions (Signed)
Start clomid day 5-9 after menses start Expect ovulation around day 14-21 Sex every 2-3 nights If no cycle, check upt day 35, if neg call and dose will be increased If you get a cycle, call so can determine day of ovulation No sex without condoms for 2 pregnancy tests to bring on cycle

## 2014-02-04 NOTE — Progress Notes (Signed)
28 y.o. Married Caucasian female   G1P1001 here for annual exam. Pt is  currently sexually active.  She reports not using condoms on a regular basis.   Pt had negative 5-HIAA 24h urine.  Has follow up with onc next week.  Pt reports last cycle was from ocp for surgery.  Last coitus this week, no condoms  Patient's last menstrual period was 12/09/2013.          Sexually active: Yes.    The current method of family planning is none.    Exercising: No.  The patient does not participate in regular exercise at present. Last pap: 2011 Negative Alcohol: no Tobacco: no Drugs: no Gardisil: no, completed:  Urine: Negative  Health Maintenance  Topic Date Due  . Pap Smear  10/25/2012  . Influenza Vaccine  03/07/2014  . Tetanus/tdap  07/17/2021    Family History  Problem Relation Age of Onset  . Anesthesia problems Neg Hx   . Hypotension Neg Hx   . Malignant hyperthermia Neg Hx   . Pseudochol deficiency Neg Hx   . Thyroid disease Mother   . Hyperlipidemia Father   . Cancer Father     prostate cancer  . Breast cancer Maternal Grandmother   . Cancer Maternal Grandmother     bone cancer  . Diabetes Maternal Grandfather     Patient Active Problem List   Diagnosis Date Noted  . 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 Post-operative state 12/24/2013  . Amenorrhea 11/19/2013  . PCOS (polycystic ovarian syndrome) 09/30/2013  . Complex ovarian cyst 09/30/2013  . Migraines   . Depression     Past Medical History  Diagnosis Date  . PCOS (polycystic ovarian syndrome)   . Migraines   . Depression   . Infertility, female     clomid  . Stomach problems     (migraines or spasms)    Past Surgical History  Procedure Laterality Date  . Gallbladder surgery    . Wisdom tooth extraction    . Upper gi endoscopy  2011  . Colonoscopy  2011  . Laparoscopic ovarian cystectomy Right 12/24/2013    Procedure: LAPAROSCOPIC OVARIAN CYSTECTOMY;  Surgeon: Azalia Bilis, MD;  Location: [facility];  Service: Gynecology;  Laterality: Right;    Allergies: Penicillins  Current Outpatient Prescriptions  Medication Sig Dispense Refill  . calcium carbonate (TUMS - DOSED IN MG ELEMENTAL CALCIUM) 500 MG chewable tablet Chew 2 tablets by mouth daily as needed for indigestion or heartburn.      Marland Kitchen FLUoxetine (PROZAC) 10 MG tablet Take 1 tablet (10 mg total) by mouth daily.  30 tablet  1  . Ibuprofen (MOTRIN PO) Take 600 mg by mouth as needed (prn for pain or headache).       . Multiple Vitamins-Minerals (MULTIVITAMIN PO) Take 1 tablet by mouth daily.       . Acetaminophen (TYLENOL PO) Take 650 mg by mouth as needed (pain, headache).        No current facility-administered medications for this visit.    ROS: Pertinent items are noted in HPI.  Exam:    BP 108/60  Resp 12  Ht 5' 4.25" (1.632 m)  Wt 249 lb (112.946 kg)  BMI 42.41 kg/m2  LMP 12/09/2013 Weight change: @WEIGHTCHANGE @ Last 3 height recordings:  Ht Readings from Last 3 Encounters:  02/04/14 5' 4.25" (1.632 m)  01/21/14 5' 4.25" (1.632 m)  01/14/14 5' 4.25" (1.632 m)  General appearance: alert, cooperative and appears stated age Head: Normocephalic, without obvious abnormality, atraumatic Neck: no adenopathy, no carotid bruit, no JVD, supple, symmetrical, trachea midline and thyroid not enlarged, symmetric, no tenderness/mass/nodules Lungs: clear to auscultation bilaterally Breasts: normal appearance, no masses or tenderness Heart: regular rate and rhythm, S1, S2 normal, no murmur, click, rub or gallop Abdomen: soft, non-tender; bowel sounds normal; no masses,  no organomegaly Extremities: extremities normal, atraumatic, no cyanosis or edema Skin: Skin color, texture, turgor normal. No rashes or lesions Lymph nodes: Cervical, supraclavicular, and axillary nodes normal. no inguinal nodes palpated Neurologic: Grossly normal   Pelvic: External genitalia:  no lesions              Urethra:  normal appearing urethra with no masses, tenderness or lesions              Bartholins and Skenes: Bartholin's, Urethra, Skene's normal                 Vagina: normal appearing vagina with normal color and discharge, no lesions              Cervix: normal appearance              Pap taken: Yes.          Bimanual Exam:  Uterus:  uterus is normal size, shape, consistency and nontender                                      Adnexa:    normal adnexa in size, nontender and no masses                                      Rectovaginal: Confirms                                      Anus:  normal sphincter tone, no lesions      1. Routine gynecological examination counseled on breast self exam, adequate intake of calcium and vitamin D, diet and exercise return annually or prn Discussed STD prevention, regular condom use.    2. Laboratory examination ordered as part of a routine general medical examination  - POCT Urinalysis Dipstick  3. Amenorrhea  - POCT urine pregnancy  4. PCOS (polycystic ovarian syndrome) Needed clomid to conceive, 100mg  in 2012, will plan on starting at 50mg  again instructions given  Will abstain from sex or use condoms and rto for neg HCG, for provera if no cycle before then   5. Carcinoid tumor of ovary Normal 24h urine, f/u appt 7/6 with onc  6. Screening for cervical cancer  - PAP with Reflex to HPV (IPS)     An After Visit Summary was printed and given to the patient.

## 2014-02-08 ENCOUNTER — Other Ambulatory Visit: Payer: Self-pay | Admitting: Oncology

## 2014-02-10 LAB — IPS PAP TEST WITH REFLEX TO HPV

## 2014-02-11 ENCOUNTER — Encounter: Payer: Self-pay | Admitting: Oncology

## 2014-02-11 ENCOUNTER — Ambulatory Visit (HOSPITAL_BASED_OUTPATIENT_CLINIC_OR_DEPARTMENT_OTHER): Payer: BC Managed Care – PPO | Admitting: Oncology

## 2014-02-11 VITALS — BP 125/81 | HR 80 | Temp 98.7°F | Resp 18 | Ht 64.0 in | Wt 247.0 lb

## 2014-02-11 DIAGNOSIS — C7A098 Malignant carcinoid tumors of other sites: Secondary | ICD-10-CM

## 2014-02-11 DIAGNOSIS — E282 Polycystic ovarian syndrome: Secondary | ICD-10-CM

## 2014-02-11 DIAGNOSIS — Z803 Family history of malignant neoplasm of breast: Secondary | ICD-10-CM

## 2014-02-11 NOTE — Progress Notes (Signed)
OFFICE PROGRESS NOTE   02/11/2014   Physicians:Lathrop, Tonita Phoenix   INTERVAL HISTORY:  Patient is seen, together with husband, now having had 24 hour urine for 5HIAA due to incidental finding of 2.5 mm carcinoid in mature teratoma of right ovary. The 24 hour urine had 2.8 mg 5HIAA in 24 hours, which is in normal range. She has recovered entirely from surgery for the ovarian cyst/ teratoma. She is not having symptoms concerning for carcinoid syndrome, including no wheezing, no diarrhea, no severe flushing.   ONCOLOGIC HISTORY Patient has polycystic ovarian disease and presented with a few months of symptoms which were similar to those with an unexpected pregnancy ~ 3 years ago (nausea, fatigue, "prominent veins" chest and UE). She was found to have a complex right ovarian cyst, thought likely a dermoid cyst, which did not resolve by serial ultrasounds over next few months. She went to laparoscopic right ovarian cystectomy by Dr Charlies Constable on 12-24-2013. Findings at surgery were on normal appearing uterus and tubes, minimally enlarged left ovary consistent with PCO and right ovary markedly enlarged with distortion of anatomy. The right cystic area was removed without requiring right oophorectomy. Pathology([id number]) found mature teratoma with associated 0.25 cm carcinoid tumor; no grade of the carcinoid given in path report  Review of systems as above, also: Otherwise unchanged. She is not getting any regular exercise Remainder of 10 point Review of Systems negative.  Objective:  Vital signs in last 24 hours:  BP 125/81  Pulse 80  Temp(Src) 98.7 F (37.1 C) (Oral)  Resp 18  Ht 5\' 4"  (1.626 m)  Wt 247 lb (112.038 kg)  BMI 42.38 kg/m2  LMP 12/09/2013  Alert, oriented and appropriate. Ambulatory without difficulty.   HEENT:PERRL, sclerae not icteric. Oral mucosa moist without lesions, posterior pharynx clear.  Neck supple. No JVD.  Lymphatics:no  cervical,suraclavicular adenopathy Resp: clear to auscultation bilaterally and normal percussion bilaterally Cardio: regular rate and rhythm. No gallop. GI: abdomen obese, soft, nontender, not distended, no appreciable mass or organomegaly. Normally active bowel sounds. Surgical incision not remarkable. Musculoskeletal/ Extremities: without pitting edema, cords, tenderness Neuro:  nonfocal Skin without rash, ecchymosis, petechiae   Lab Results:  24 hour urine 5HIAA 01-28-14 normal at 2.8 mg/24 hours (normal range 6mg /24 hrs)   Medications: I have reviewed the patient's current medications.  DISCUSSION: Patient mentions that maternal grandmother and that grandmother's 3 sisters all had breast cancer; patient's mother has not had breast cancer. We have discussed genetics testing on the individual with cancer, then an identified abnormality can be screened for in family members, but we would not test Ms.Seelinger in absence of personal history. We have discussed lifestyle interventions to decrease risk of breast, uterine, colon cancers, including healthy diet with emphasis on fruits and vegetables/ less red meat and saturated fat; regular exercise, recommending adding 5000 steps daily to present baseline.   Assessment/Plan:  1. 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 24 hour urine for 5HIAA is fine. No additional scans or routine follow up needed. 2.polycystic ovarian syndrome  3.post cholecystectomy  4.esophageal stricture previously dilated, again seems symptomatic, to follow up with GI  5.previous upper and lower endoscopies back to childhood.  6.obesity, BMI 43. Discussed and gave recommendations as above.   Patient and husband had all questions answered and are pleased with findings. I will see her back prn. Cc this note to Dr Charlies Constable and Dr Earlie Counts,  MD   02/11/2014, 3:51 PM

## 2014-02-16 ENCOUNTER — Ambulatory Visit (INDEPENDENT_AMBULATORY_CARE_PROVIDER_SITE_OTHER): Payer: BC Managed Care – PPO | Admitting: Gynecology

## 2014-02-16 DIAGNOSIS — N912 Amenorrhea, unspecified: Secondary | ICD-10-CM

## 2014-02-16 DIAGNOSIS — N970 Female infertility associated with anovulation: Secondary | ICD-10-CM

## 2014-02-17 LAB — HCG, QUANTITATIVE, PREGNANCY: hCG, Beta Chain, Quant, S: <2 m[IU]/mL

## 2014-02-17 MED ORDER — CLOMIPHENE CITRATE 50 MG PO TABS: 50.0000 mg | ORAL_TABLET | Freq: Every day | ORAL | Status: DC

## 2014-02-17 MED ORDER — MEDROXYPROGESTERONE ACETATE 10 MG PO TABS: 10.0000 mg | ORAL_TABLET | Freq: Every day | ORAL | Status: DC

## 2014-02-17 NOTE — Progress Notes (Signed)
Lab only 

## 2014-04-06 ENCOUNTER — Telehealth: Payer: Self-pay | Admitting: Gynecology

## 2014-04-06 DIAGNOSIS — N912 Amenorrhea, unspecified: Secondary | ICD-10-CM

## 2014-04-06 DIAGNOSIS — N970 Female infertility associated with anovulation: Secondary | ICD-10-CM

## 2014-04-06 MED ORDER — CLOMIPHENE CITRATE 50 MG PO TABS: 100.0000 mg | ORAL_TABLET | Freq: Every day | ORAL | Status: DC

## 2014-04-06 MED ORDER — MEDROXYPROGESTERONE ACETATE 10 MG PO TABS: 10.0000 mg | ORAL_TABLET | Freq: Every day | ORAL | Status: DC

## 2014-04-06 NOTE — Telephone Encounter (Signed)
Spoke with patient. Patient states that she has taken pregnancy test which was negative and has not started cycle. Advised spoke with Dr.Lathrop and she would like patient to start provera again to induce cycle and then begin clomid again on day five. Dr.Lathrop would like to increase dose of clomid to 100mg . Patient is agreeable and verbalizes understanding. Rx for Provera 10mg  #10 0RF and Clomid 50mg  take 2 tablets daily for five days starting day five of cycle #10 0RF to pharmacy on file. Patient agreeable.  Routing to Dr.Lathrop for final review before closing

## 2014-04-06 NOTE — Telephone Encounter (Signed)
Pt says she was told to call on day 35 of her cycle.

## 2014-05-20 NOTE — Telephone Encounter (Signed)
Dr.Lathrop, okay to close encounter?

## 2014-05-20 NOTE — Telephone Encounter (Signed)
Okay to close

## 2014-05-21 ENCOUNTER — Telehealth: Payer: Self-pay | Admitting: Gynecology

## 2014-05-21 DIAGNOSIS — N970 Female infertility associated with anovulation: Secondary | ICD-10-CM

## 2014-05-21 NOTE — Telephone Encounter (Signed)
Patient was told to call when she started her cycle.  

## 2014-05-22 MED ORDER — CLOMIPHENE CITRATE 50 MG PO TABS: 100.0000 mg | ORAL_TABLET | Freq: Every day | ORAL | Status: DC

## 2014-05-22 NOTE — Telephone Encounter (Signed)
Message from Dr. Charlies Constable given to patient. She verbalized understanding. She will follow up prn. Routing to provider for final review. Patient agreeable to disposition. Will close encounter

## 2014-05-22 NOTE — Telephone Encounter (Signed)
Spoke with patient. She did have a September cycle with using Clomid 100 mg cycle days 5-9.  Patient started her cycle on 05/21/14. This was cycle day 33 for her.  She states that Dr. Charlies Constable advised her previously that Dr. Charlies Constable would be able to tell what cycle day she would ovulate on based on these dates. She is wondering Dr. Brion Aliment opinion. Patient is not using otc ovulation predictors.

## 2014-05-22 NOTE — Telephone Encounter (Signed)
Dr. Charlies Constable, can you review and advise what plan of care is next for patient?

## 2014-05-22 NOTE — Telephone Encounter (Signed)
Patient would like to talk to a nurse today. I told patient the nurse was waiting to hear from [place]. Patient says Dr.lathrop is supposed to call in a prescription when her cycle started. Patient needs to know what to do next?

## 2014-05-22 NOTE — Telephone Encounter (Signed)
Since she had a cycle after taking clomid, we can assume she ovulated that month, will stay on 100mg  and take day 5-9.  If cycle was 33d, she would ovulate around day 19 but should try to avoid cluster sex only around the time of ovulation, every 3rd day is better frequency. Clomid sent in

## 2014-06-03 NOTE — Telephone Encounter (Signed)
Routing to Dr.Lathrop for review before closing encounter.

## 2014-06-03 NOTE — Telephone Encounter (Signed)
Okay to close

## 2014-06-03 NOTE — Telephone Encounter (Signed)
yes

## 2014-06-08 ENCOUNTER — Encounter: Payer: Self-pay | Admitting: Oncology

## 2014-06-23 ENCOUNTER — Ambulatory Visit (INDEPENDENT_AMBULATORY_CARE_PROVIDER_SITE_OTHER): Payer: BC Managed Care – PPO | Admitting: Nurse Practitioner

## 2014-06-23 ENCOUNTER — Encounter: Payer: Self-pay | Admitting: Nurse Practitioner

## 2014-06-23 VITALS — BP 120/86 | HR 68 | Ht 64.0 in | Wt 242.0 lb

## 2014-06-23 DIAGNOSIS — N912 Amenorrhea, unspecified: Secondary | ICD-10-CM

## 2014-06-23 LAB — POCT URINE PREGNANCY: Preg Test, Ur: POSITIVE

## 2014-06-23 NOTE — Progress Notes (Signed)
  28 y.o.Married Caucasian female presents with no menses sinceOctober 15.  Took Clomid on CD # 5-9 trying to obtain a pregnancy. She was started on Clomid after AEX in July from Dr. Charlies Constable. On prenatal MVI.   Periods were not regular in the past, occurring every 33 days. Patient reports nausea and fatigue. Reports occasional condom use since surgery in May. Urine pregnancy positive.  Patient admits to having been diagnosed with polycystic ovaries.  ONCOLOGIC HISTORY Patient has polycystic ovarian disease and presented with a few months of symptoms which were similar to those with an unexpected pregnancy ~ 3 years ago (nausea, fatigue, "prominent veins" chest and UE). She was found to have a complex right ovarian cyst, thought likely a dermoid cyst, which did not resolve by serial ultrasounds over next few months. She went to laparoscopic right ovarian cystectomy by Dr Charlies Constable on 12-24-2013. Findings at surgery were on normal appearing uterus and tubes, minimally enlarged left ovary consistent with PCO and right ovary markedly enlarged with distortion of anatomy. The right cystic area was removed without requiring right oophorectomy. 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  Plan:  Discussed with patient the need to follow closely with a PUS   If any vaginal bleeding or pain will need to CB  Will need to discuss with Dr. Quincy Simmonds for referral to high risk OB if needed  Labs: serum HCG

## 2014-06-24 LAB — HCG, QUANTITATIVE, PREGNANCY: hCG, Beta Chain, Quant, S: 53 m[IU]/mL

## 2014-06-25 ENCOUNTER — Other Ambulatory Visit: Payer: Self-pay | Admitting: Nurse Practitioner

## 2014-06-25 ENCOUNTER — Telehealth: Payer: Self-pay | Admitting: Nurse Practitioner

## 2014-06-25 ENCOUNTER — Other Ambulatory Visit: Payer: Self-pay | Admitting: *Deleted

## 2014-06-25 DIAGNOSIS — N912 Amenorrhea, unspecified: Secondary | ICD-10-CM

## 2014-06-25 NOTE — Progress Notes (Signed)
Encounter reviewed.  Will follow with quant Beta hCGs and do ultrasound at about 6 weeks.  Will then refer to [facility] office.  Will defer to them if they would like a high risk OB consultation or not.

## 2014-06-25 NOTE — Telephone Encounter (Signed)
She is aware of plan and PUS is scheduled for next Tuesday.

## 2014-06-25 NOTE — Telephone Encounter (Signed)
Patty, I just wanted to ensure patient is aware of plan? Patient has lab appointment for Friday scheduled by front desk.

## 2014-06-25 NOTE — Telephone Encounter (Signed)
Patient is given serum HCG results and advised to repeat serum HCG in am on Friday and we will also be doing a PUS in 6 weeks instead of 7 weeks.  They are getting ready to move this weekend and she is anxious as to the pregnancy -we will do the test Stat and call her earlier instead of waiting through the weekend.

## 2014-06-26 ENCOUNTER — Other Ambulatory Visit (INDEPENDENT_AMBULATORY_CARE_PROVIDER_SITE_OTHER): Payer: BC Managed Care – PPO

## 2014-06-26 DIAGNOSIS — N912 Amenorrhea, unspecified: Secondary | ICD-10-CM

## 2014-06-26 LAB — HCG, QUANTITATIVE, PREGNANCY: hCG, Beta Chain, Quant, S: 113.2 m[IU]/mL

## 2014-06-30 ENCOUNTER — Ambulatory Visit (INDEPENDENT_AMBULATORY_CARE_PROVIDER_SITE_OTHER): Payer: BC Managed Care – PPO

## 2014-06-30 ENCOUNTER — Ambulatory Visit (INDEPENDENT_AMBULATORY_CARE_PROVIDER_SITE_OTHER): Payer: BC Managed Care – PPO | Admitting: Obstetrics & Gynecology

## 2014-06-30 ENCOUNTER — Other Ambulatory Visit: Payer: Self-pay | Admitting: Obstetrics & Gynecology

## 2014-06-30 VITALS — BP 120/78 | Ht 64.0 in | Wt 243.0 lb

## 2014-06-30 DIAGNOSIS — N912 Amenorrhea, unspecified: Secondary | ICD-10-CM

## 2014-06-30 DIAGNOSIS — D369 Benign neoplasm, unspecified site: Secondary | ICD-10-CM

## 2014-06-30 DIAGNOSIS — D489 Neoplasm of uncertain behavior, unspecified: Secondary | ICD-10-CM

## 2014-06-30 LAB — US OB TRANSVAGINAL

## 2014-06-30 NOTE — Progress Notes (Signed)
28 y.o. G1P1 Married Caucasian female here for a pelvic ultrasound due to early + pregnancy test.  Pt with hx of dermoid that was removed 12/24/13 with laparoscopic ovarian cystectomy.  Pathology showed dermoid and a small area of carcinoid.  Pt was referred to Dr. Marko Plume.  24 hour for 5HIAA was negative.  Pt has been released.  Pt with known hx of PCOS requiring Clomid with first pregnancy.   Pt started on Clomid 100mg  in September and did cycle.  Took this again in October, as well.  LMP 05/21/14.  EGA 5 1/7 weeks.  EDC by LMP 02/25/15.  Pt is having some fatigue but that is all.   Patient's last menstrual period was 05/21/2014 (exact date).  FINDINGS: UTERUS: with single gestational sac measuring 52mm but no evidence of yolk sac, fetal pole or FCA.  Tiny amount of free fluid in cavity noted. ADNEXA:  Left ovary 3.2 x 2.6cm with 1.5cm corpus luteal cyst noted              Right ovary 2.2 x 1.2 x 1.3cm  CUL DE SAC: no free fluid  Findings reviewed with pt and spouse.  U/S is very early.  NP, Kem Boroughs, recommended pt have this early due to possible ectopic risks.  Pt denies VB or pain.  Has had a little discomfort on her right side but has something similar with first pregnancy.  Will need to repeat PUS in two weeks.  Pt and spouse in agreement.  Assessment:  Early pregnancy, not enough evidence on PUS today to classify IUD but nothing worrisome for ectopic H/O ovarian cystectomy due to dermoid and pathology finding of small carcinoid turmor.  Seen and released by Dr. Marko Plume. Plan: Return for PUS 2 weeks.  ~15 minutes spent with patient >50% of time was in face to face discussion of above.

## 2014-07-08 ENCOUNTER — Encounter: Payer: Self-pay | Admitting: Obstetrics & Gynecology

## 2014-07-13 ENCOUNTER — Encounter: Payer: Self-pay | Admitting: Obstetrics & Gynecology

## 2014-07-13 ENCOUNTER — Other Ambulatory Visit: Payer: Self-pay | Admitting: Obstetrics and Gynecology

## 2014-07-13 ENCOUNTER — Telehealth: Payer: Self-pay | Admitting: Emergency Medicine

## 2014-07-13 MED ORDER — DOXYLAMINE-PYRIDOXINE 10-10 MG PO TBEC: 2.0000 | DELAYED_RELEASE_TABLET | Freq: Every evening | ORAL | Status: DC | PRN

## 2014-07-13 NOTE — Telephone Encounter (Signed)
This is Dr. Quincy Simmonds answering for Dr. Sabra Heck.  Patient can take Diclegis 2 tabs po q hs prn nausea.  Rx sent to her pharmacy already.

## 2014-07-13 NOTE — Telephone Encounter (Signed)
Spoke with patient. Advised of message from Dr. Sabra Heck. Okay to order Phenergan 12.5 mg 1 tablet po q 6 hours as needed for vomiting. Patient given instructions to seek care at urgent care or ER if unable to tolerate fluids or foods for 12 hours or has symptoms of concentrated urine or decreased urine output. Patient agreeable to this.   Patient declines order for Phenergan 12.5. She states she has taken this is in the past and was not effective.  Patient requests an order for Diclegis.  Patient would like Dr. Sabra Heck to review and advise if can have order for this medication as she has had friends who have taken this medication with good response.  Advised would send her request to Dr. Sabra Heck and return her call.

## 2014-07-13 NOTE — Telephone Encounter (Signed)
Patient sent email through mychart:   I have a 7 week ultrasound scheduled this Thursday with Dr. Sabra Heck and was going to talk to her then but I really need help. I have been extremely sick. Much more than with my first pregnancy so this was kinda unexpected. I'm so much more nauseous, in pain, weak, throwing up some with this pregnancy since week 4. It is becoming debilitating and makes it hard to care for my toddler. I was hoping there was something that may help me? Thanks.

## 2014-07-13 NOTE — Telephone Encounter (Signed)
Spoke with patient. Reviewed mychart message with her.  She states that she has been feeling nauseated since prior to learning about pregnancy then it subsided and then returned at about week 5 of pregnancy.  Patient states "I am so nauseous I can't even move and I am exhausted."  Patient states she is eating small amounts of food and drinking small sips of fluids.  States she is vomiting "periodically" but feels she is not vomiting as much because she is keeping fluid and food intake lower, but she she is adequately hydrated. Drinking mostly water. States nausea is the most concerning to her.  Patient denies pelvic or abdominal pain. She states that the pain is "over my entire body, I think it's just from being so exhausted."  Denies vaginal discharge or vaginal bleeding. Patient also denies fevers or diarrhea. Patient is sure she is not constipated. Patient has used Zofran in the past, but ended up at [facility] hospital with fecal impaction due to constipation so does not want to use Zofran again.  Advised will discuss complaints with Dr. Sabra Heck and return her call. Patient agreeable.  Patient has pelvis ultrasound scheduled with Dr. Sabra Heck 07/16/14.

## 2014-07-13 NOTE — Telephone Encounter (Signed)
Spoke with patient. Advised patient of message as seen below from [place]. Patient is agreeable and verbalizes understanding. Patient would like rx sent to [facility] off [address] instead. Rx transferred to pharmacy of choice. Patient is agreeable and verbalizes understanding.  Cc: Dr.Miller  Routing to provider for final review. Patient agreeable to disposition. Will close encounter

## 2014-07-16 ENCOUNTER — Ambulatory Visit (INDEPENDENT_AMBULATORY_CARE_PROVIDER_SITE_OTHER): Payer: BC Managed Care – PPO | Admitting: Obstetrics & Gynecology

## 2014-07-16 ENCOUNTER — Ambulatory Visit (INDEPENDENT_AMBULATORY_CARE_PROVIDER_SITE_OTHER): Payer: BC Managed Care – PPO

## 2014-07-16 ENCOUNTER — Other Ambulatory Visit: Payer: Self-pay | Admitting: Obstetrics & Gynecology

## 2014-07-16 VITALS — BP 122/60 | Resp 14 | Ht 64.0 in | Wt 243.0 lb

## 2014-07-16 DIAGNOSIS — N912 Amenorrhea, unspecified: Secondary | ICD-10-CM

## 2014-07-16 LAB — US OB TRANSVAGINAL

## 2014-07-16 MED ORDER — DOXYLAMINE-PYRIDOXINE 10-10 MG PO TBEC: 2.0000 | DELAYED_RELEASE_TABLET | Freq: Every evening | ORAL | Status: DC | PRN

## 2014-07-16 NOTE — Progress Notes (Signed)
28 y.o. G2P1Marriedfemale here for a pelvic ultrasound for viability.  Had PUS 06/30/14 but ultrasound was early so no clear IUP was noted.  Since that time, nausea has started.  Pt has not done well in the past with phenergan or zofran.  On diclegis which she reports is really helping.  She is dealing with the drowsiness as this really isn't as bad for her as the nausea.  Patient's last menstrual period was 05/21/2014 (exact date).  ECG 7 3/7.  EDC 03/01/15.  FINDINGS: UTERUS: with single gestational sac, yolk sac, fetal pole measuring 0.95cm (c/w dating) and FCA at 141 bpm noted.  7 0/7 by PUS today.  CWD ADNEXA:   Left ovary WNL.   Right ovary not clearly seen.  No obvious masses noted. CUL DE SAC: no free fluid  Findings d/w pt.  She is going to transfer ob care now.  Considering [place].    Knows to not change kitty litter.  Tdap UTD.  Had chicken pox.  Aware flu vaccine safe and recommended.   Fish/shellfish/mercury discuss.  Patient does not eat fish.  Unpasteurized cheese/juices discussed.  Nitrites in foods disucssed.  Exercise and intercourse discussed.   Assessment:  [place] IUP at 7 0/7 weeks by LMP Plan: Pt will call with information for records to be sent for transfer of care.  Wished well.  ~15 minutes spent with patient >50% of time was in face to face discussion of above.

## 2014-07-20 ENCOUNTER — Encounter: Payer: Self-pay | Admitting: Obstetrics & Gynecology

## 2014-07-27 ENCOUNTER — Encounter: Payer: Self-pay | Admitting: Obstetrics & Gynecology

## 2014-07-28 LAB — OB RESULTS CONSOLE ABO/RH: RH Type: POSITIVE

## 2014-07-28 LAB — OB RESULTS CONSOLE HEPATITIS B SURFACE ANTIGEN: Hepatitis B Surface Ag: NEGATIVE

## 2014-07-28 LAB — OB RESULTS CONSOLE GC/CHLAMYDIA
Chlamydia: NEGATIVE
Gonorrhea: NEGATIVE

## 2014-07-28 LAB — OB RESULTS CONSOLE ANTIBODY SCREEN: Antibody Screen: NEGATIVE

## 2014-07-28 LAB — OB RESULTS CONSOLE HIV ANTIBODY (ROUTINE TESTING): HIV: NONREACTIVE

## 2014-07-28 LAB — OB RESULTS CONSOLE RPR: RPR: NONREACTIVE

## 2014-07-28 LAB — OB RESULTS CONSOLE RUBELLA ANTIBODY, IGM: Rubella: NON-IMMUNE/NOT IMMUNE

## 2014-08-07 NOTE — L&D Delivery Note (Signed)
Delivery Note At 11:47 PM a viable female "[name]" was delivered via Vaginal, Spontaneous Delivery (Presentation: OA restituting to ROA).  APGARS: 4, 9; weight pending.   Placenta status: Intact, Spontaneous.  Cord: 3 vessels with the following complications: None.  Cord pH: NA  Anesthesia: Local for repair  Episiotomy: None Lacerations: 2nd degree Perineal Suture Repair: 3.0 vicryl vicryl rapide Est. Blood Loss (mL): 150  Mom to postpartum.  Baby to Couplet care / Skin to Skin.  10 units Pitocin given after delivery of placenta.  Mom plans to breastfeed.  Couple declines circumcision.  [name] 03/07/2015, 12:56 AM

## 2014-08-10 ENCOUNTER — Encounter: Payer: Self-pay | Admitting: Obstetrics & Gynecology

## 2015-02-04 LAB — OB RESULTS CONSOLE GBS: GBS: NEGATIVE

## 2015-02-05 ENCOUNTER — Telehealth: Payer: Self-pay | Admitting: Obstetrics and Gynecology

## 2015-02-05 NOTE — Telephone Encounter (Signed)
Pt called to cancel her aex with Dr Quincy Simmonds. Pt states she is [redacted] weeks pregnant and unsure when she should have her next aex.

## 2015-02-05 NOTE — Telephone Encounter (Signed)
Spoke with patient. Advised patient is able to return to our practice after release from OB at 6 week postpartum. Advised must be cleared with OB first. Advised at which time she may be seen for annual exam. Advised OB may perform pap is needed during her visits with their office. Advised to speak with OB regarding this. Dependent upon this aex can be scheduled after 6 week post partum appointment or if OB performs pap 1 year from the date it is performed. Patient is agreeable.  Routing to provider for final review. Patient agreeable to disposition. Will close encounter.   Patient aware provider will review message and nurse will return call if any additional advice or change of disposition.

## 2015-02-10 ENCOUNTER — Ambulatory Visit: Payer: BC Managed Care – PPO | Admitting: Gynecology

## 2015-02-11 ENCOUNTER — Ambulatory Visit: Payer: BC Managed Care – PPO | Admitting: Obstetrics and Gynecology

## 2015-03-06 ENCOUNTER — Inpatient Hospital Stay (HOSPITAL_COMMUNITY)
Admission: AD | Admit: 2015-03-06 | Discharge: 2015-03-08 | DRG: 775 | Disposition: A | Discharge status: Home / Self Care | Payer: BLUE CROSS/BLUE SHIELD | Source: Ambulatory Visit | Attending: Obstetrics and Gynecology | Admitting: Obstetrics and Gynecology

## 2015-03-06 ENCOUNTER — Encounter (HOSPITAL_COMMUNITY): Payer: Self-pay | Admitting: *Deleted

## 2015-03-06 DIAGNOSIS — Z3A40 40 weeks gestation of pregnancy: Secondary | ICD-10-CM | POA: Diagnosis present

## 2015-03-06 DIAGNOSIS — F419 Anxiety disorder, unspecified: Secondary | ICD-10-CM | POA: Diagnosis present

## 2015-03-06 DIAGNOSIS — F329 Major depressive disorder, single episode, unspecified: Secondary | ICD-10-CM | POA: Diagnosis present

## 2015-03-06 DIAGNOSIS — Z833 Family history of diabetes mellitus: Secondary | ICD-10-CM | POA: Diagnosis not present

## 2015-03-06 DIAGNOSIS — O99344 Other mental disorders complicating childbirth: Secondary | ICD-10-CM | POA: Diagnosis present

## 2015-03-06 DIAGNOSIS — Z803 Family history of malignant neoplasm of breast: Secondary | ICD-10-CM

## 2015-03-06 DIAGNOSIS — K3189 Other diseases of stomach and duodenum: Secondary | ICD-10-CM | POA: Diagnosis not present

## 2015-03-06 DIAGNOSIS — E282 Polycystic ovarian syndrome: Secondary | ICD-10-CM | POA: Diagnosis present

## 2015-03-06 DIAGNOSIS — O99214 Obesity complicating childbirth: Secondary | ICD-10-CM | POA: Diagnosis present

## 2015-03-06 DIAGNOSIS — Z8349 Family history of other endocrine, nutritional and metabolic diseases: Secondary | ICD-10-CM | POA: Diagnosis not present

## 2015-03-06 DIAGNOSIS — Z6841 Body Mass Index (BMI) 40.0 and over, adult: Secondary | ICD-10-CM

## 2015-03-06 DIAGNOSIS — N979 Female infertility, unspecified: Secondary | ICD-10-CM | POA: Diagnosis not present

## 2015-03-06 DIAGNOSIS — Z880 Allergy status to penicillin: Secondary | ICD-10-CM | POA: Diagnosis not present

## 2015-03-06 DIAGNOSIS — T360X5A Adverse effect of penicillins, initial encounter: Secondary | ICD-10-CM | POA: Diagnosis present

## 2015-03-06 HISTORY — DX: Anxiety disorder, unspecified: F41.9

## 2015-03-06 LAB — TYPE AND SCREEN
ABO/RH(D): A POS
ANTIBODY SCREEN: NEGATIVE

## 2015-03-06 LAB — CBC
HEMATOCRIT: 37.3 % (ref 36.0–46.0)
Hemoglobin: 12.5 g/dL (ref 12.0–15.0)
MCH: 26.9 pg (ref 26.0–34.0)
MCHC: 33.5 g/dL (ref 30.0–36.0)
MCV: 80.2 fL (ref 78.0–100.0)
Platelets: 203 10*3/uL (ref 150–400)
RBC: 4.65 MIL/uL (ref 3.87–5.11)
RDW: 15.3 % (ref 11.5–15.5)
WBC: 15.1 10*3/uL — AB (ref 4.0–10.5)

## 2015-03-06 MED ORDER — FENTANYL 2.5 MCG/ML BUPIVACAINE 1/10 % EPIDURAL INFUSION (WH - ANES): 14.0000 mL/h | INTRAMUSCULAR | Status: DC | PRN

## 2015-03-06 MED ORDER — CITRIC ACID-SODIUM CITRATE 334-500 MG/5ML PO SOLN: 30.0000 mL | ORAL | Status: DC | PRN

## 2015-03-06 MED ORDER — LIDOCAINE HCL (PF) 1 % IJ SOLN
30.0000 mL | INTRAMUSCULAR | Status: DC | PRN
  Administered 2015-03-06: 30 mL via SUBCUTANEOUS
  Filled 2015-03-06: qty 30

## 2015-03-06 MED ORDER — LACTATED RINGERS IV SOLN: INTRAVENOUS | Status: DC

## 2015-03-06 MED ORDER — OXYCODONE-ACETAMINOPHEN 5-325 MG PO TABS: 2.0000 | ORAL_TABLET | ORAL | Status: DC | PRN

## 2015-03-06 MED ORDER — DIPHENHYDRAMINE HCL 50 MG/ML IJ SOLN: 12.5000 mg | INTRAMUSCULAR | Status: DC | PRN

## 2015-03-06 MED ORDER — NALBUPHINE HCL 10 MG/ML IJ SOLN
10.0000 mg | INTRAMUSCULAR | Status: DC | PRN
Start: 2015-03-06 — End: 2015-03-07

## 2015-03-06 MED ORDER — ONDANSETRON HCL 4 MG/2ML IJ SOLN: 4.0000 mg | Freq: Four times a day (QID) | INTRAMUSCULAR | Status: DC | PRN

## 2015-03-06 MED ORDER — OXYTOCIN 40 UNITS IN LACTATED RINGERS INFUSION - SIMPLE MED: 62.5000 mL/h | INTRAVENOUS | Status: DC

## 2015-03-06 MED ORDER — ACETAMINOPHEN 325 MG PO TABS: 650.0000 mg | ORAL_TABLET | ORAL | Status: DC | PRN

## 2015-03-06 MED ORDER — PHENYLEPHRINE 40 MCG/ML (10ML) SYRINGE FOR IV PUSH (FOR BLOOD PRESSURE SUPPORT)
80.0000 ug | PREFILLED_SYRINGE | INTRAVENOUS | Status: DC | PRN
  Filled 2015-03-06: qty 2

## 2015-03-06 MED ORDER — FLEET ENEMA 7-19 GM/118ML RE ENEM: 1.0000 | ENEMA | RECTAL | Status: DC | PRN

## 2015-03-06 MED ORDER — LACTATED RINGERS IV SOLN
500.0000 mL | INTRAVENOUS | Status: DC | PRN
  Administered 2015-03-06: 1000 mL via INTRAVENOUS

## 2015-03-06 MED ORDER — EPHEDRINE 5 MG/ML INJ
10.0000 mg | INTRAVENOUS | Status: DC | PRN
  Filled 2015-03-06: qty 2

## 2015-03-06 MED ORDER — OXYTOCIN 10 UNIT/ML IJ SOLN
INTRAMUSCULAR | Status: AC
  Administered 2015-03-06
  Filled 2015-03-06: qty 1

## 2015-03-06 MED ORDER — OXYCODONE-ACETAMINOPHEN 5-325 MG PO TABS: 1.0000 | ORAL_TABLET | ORAL | Status: DC | PRN

## 2015-03-06 MED ORDER — OXYTOCIN BOLUS FROM INFUSION
500.0000 mL | INTRAVENOUS | Status: DC
Start: 2015-03-06 — End: 2015-03-07

## 2015-03-06 NOTE — MAU Note (Signed)
Pt c/o ctx. Lost mucus plug this morning. Desires water birth

## 2015-03-06 NOTE — Progress Notes (Signed)
  Subjective: In tub ~ 2 hrs. Screaming in pain. IV infiltrated. Reports the need to urinate. Requests epidural. Spouse supportive.  Objective: BP 142/79 mmHg  Pulse 97  Temp(Src) 98.4 F (36.9 C) (Oral)  Resp 22  Ht 5\' 4"  (1.626 m)  Wt 117.935 kg (260 lb)  BMI 44.61 kg/m2  LMP 05/21/2014 (Exact Date)      FHT: Category 1 - intermittent monitoring UC:   irregular, every 1-2 minutes SVE:   Dilation: 5 Effacement (%): 90 Station: -2 Exam by:: k.Ly Wass,cnm  ? SROM when standing up to get out of tub.  Assessment:  IUP at 40.2 wks Active labor ? SROM, clear fluid  Plan: Out of tub to urinate, for continuous monitoring, IV replacement, IVFs and vaginal exam.  [name] CNM 03/06/2015, 11:42 PM   ADDENDUM:  Returned from bathroom and now in bed, screaming continuous. Cervix noted to be C/C/+3, constant bearing down. Prepare for imminent delivery.  [name], CNM 03/06/15, 11:43 PM

## 2015-03-06 NOTE — H&P (Signed)
[name] is a 29 y.o. female, G2P1001 at 40.2 weeks, presenting for regular contractions since last night, worse over the past 2+ hrs. +FM. Lost mucus plug last evening. Denies bleeding or leaking. Desires WB, class attended, consent for trial obtained.  Patient Active Problem List   Diagnosis Date Noted  . Normal labor 03/06/2015  . Allergic reaction to penicillin 03/06/2015  . Anxiety 03/06/2015  . 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 Stomach spasm 03/06/2015  . Obesity, Class III, BMI 40-49.9 (morbid obesity) 02/11/2014  . 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 Amenorrhea 11/19/2013  . PCOS (polycystic ovarian syndrome) 09/30/2013  . Migraines   . Depression   Rubella Equivocal                                                                                                                     03/06/2015  History of present pregnancy: Patient entered care at 10.5 weeks, transfer in from Dr. Carron Brazen office. Pregnancy achieved through Clomid.  EDC of 03/04/15 was established by 7.0 wk scan.   Anatomy scan: 19.6 weeks, normal anatomy, return for completion of spine. Posterior placenta.  Additional [place] evaluations: 10 wks: SIUP, RTOV not seen.  22 wks: Normal anatomy completion. 36 wks due to elevated BMI: Vertex. EFW 6+13 (79th %tile). Posterior placenta. BPP 8/8. AFI 14.1 cm.  Significant prenatal events: Nausea in first trimester - relieved w/ Diclegis. Cramping and vaginal pressure in 2nd trimester -- declined BPP at 32 wks (elevated BMI); agreed to 36 wks only. Right sided pelvic pain - recommended Chiropractor.  Attended WB class at 30+ wks. BH ctxs, pelvic pain and pressure at 39+ wks. No MAU visits. No Tdap. Rubella equivocal - will offer vaccine pp. 17-lb TWG. Last evaluation: 03/02/15 @ 39.5 wks by SDJ, CNM. FHR 130 bpm. Vertex. Cvx 1/50/-2. BP 110/76. 260 lbs. Membranes swept.  OB History    Gravida Para Term Preterm AB TAB SAB Ectopic  Multiple Living   _0 SVD on 07/17/2011 @ 40 wks, 31-hrs, female infant, birthwt 8+6, epidural, WHG -- failed epidural x 2 attempts per pt report "[place]" -- delivery by Dr. Vanessa Kick. Pregnancy achieved through Clomid. Past Medical History  Diagnosis Date  . PCOS (polycystic ovarian syndrome)   . Migraines   . Depression   . Infertility, female     clomid  . Stomach problems     (migraines or spasms)  . Anxiety    Past Surgical History  Procedure Laterality Date  . Gallbladder surgery    . Wisdom tooth extraction    . Upper gi endoscopy  2011  . Colonoscopy  2011  . Laparoscopic ovarian cystectomy Right 12/24/2013    Procedure: LAPAROSCOPIC OVARIAN CYSTECTOMY;  Surgeon: Azalia Bilis, MD;  Location: [facility];  Service: Gynecology;  Laterality: Right;   Family History: family history includes Breast cancer in her maternal grandmother; Cancer in  her father and maternal grandmother; Diabetes in her maternal grandfather; Hyperlipidemia in her father; Thyroid disease in her mother. There is no history of Anesthesia problems, Hypotension, Malignant hyperthermia, or Pseudochol deficiency.Heart disease and asthma in her father, Anemia and Type 2 DM in her mother. Social History:  reports that she has never smoked. She has never used smokeless tobacco. She reports that she does not drink alcohol or use illicit drugs. Patient is married, with FOB Cristie Hem) involved and supportive.She is Caucasian in ethnicity, of the [place] and will accept blood in an emergency. 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    Prenatal Transfer Tool  Maternal Diabetes: No Genetic Screening: Declined Maternal Ultrasounds/Referrals: Normal Fetal Ultrasounds or other Referrals:  None Maternal Substance Abuse:  No Significant Maternal Medications:  Meds include: Other: PNVs Significant Maternal Lab Results: Lab values include: Group B Strep negative, Other: Rubella equivocal  TDAP:  NA Flu: NA  ROS: 10+ point review of systems negative except as detailed in HPI.    Allergies  Allergen Reactions  . Penicillins Other (See Comments)    Reaction:  Unknown    BP 131/86 Chest clear Heart RRR without murmur Abd gravid, NT, FH S>D Pelvic: 4-1/28/-2 Cephalic by limited bedside u/s EFW: 9 lbs  Ext: 1+ DTRs bilaterally, 1-2+ bilateral edema, no clonus  FHR: Just recently on monitor UCs: Unable to determine - pt very uncomfortable, screaming  Prenatal labs: ABO, Rh: A/Positive/-- (12/22 0000) Antibody: Negative (12/22 0000) Rubella: Equivocal RPR: Nonreactive (12/22 0000)  HBsAg: Negative (12/22 0000)  HIV: Non-reactive (12/22 0000)  GBS: Negative (06/30 0000) Sickle cell/Hgb electrophoresis: NA Pap: No record GC: Neg (07/28/2014) Chlamydia: (07/28/2014) Genetic screenings: Declined Glucola: Normal at 125   Hgb 12.0 at NOB, 11.7 at 28 weeks   Assessment: IUP at 40.2 wks Fetal Wellbeing: Cat 1 GBS neg Candidate for WB Early labor Morbid obesity Rubella equivocal H/O anxiety/depression  Plan: Admit to [place] Routine [facility] orders WB consent obtained Tub lady here to set up pool - pt may enter once Cat 1 FHRT established + intermittent monitoring Offer MMR vaccine pp Offer SWS consult pp    Laurey Morale, [place] 03/06/2015, 10:06 PM

## 2015-03-07 LAB — CBC
HCT: 35.3 % — ABNORMAL LOW (ref 36.0–46.0)
Hemoglobin: 11.7 g/dL — ABNORMAL LOW (ref 12.0–15.0)
MCH: 26.7 pg (ref 26.0–34.0)
MCHC: 33.1 g/dL (ref 30.0–36.0)
MCV: 80.4 fL (ref 78.0–100.0)
Platelets: 204 10*3/uL (ref 150–400)
RBC: 4.39 MIL/uL (ref 3.87–5.11)
RDW: 15.4 % (ref 11.5–15.5)
WBC: 22.4 10*3/uL — AB (ref 4.0–10.5)

## 2015-03-07 LAB — RPR: RPR Ser Ql: NONREACTIVE

## 2015-03-07 MED ORDER — ONDANSETRON HCL 4 MG PO TABS: 4.0000 mg | ORAL_TABLET | ORAL | Status: DC | PRN

## 2015-03-07 MED ORDER — OXYCODONE-ACETAMINOPHEN 5-325 MG PO TABS: 1.0000 | ORAL_TABLET | ORAL | Status: DC | PRN

## 2015-03-07 MED ORDER — ZOLPIDEM TARTRATE 5 MG PO TABS: 5.0000 mg | ORAL_TABLET | Freq: Every evening | ORAL | Status: DC | PRN

## 2015-03-07 MED ORDER — DIPHENHYDRAMINE HCL 25 MG PO CAPS: 25.0000 mg | ORAL_CAPSULE | Freq: Four times a day (QID) | ORAL | Status: DC | PRN

## 2015-03-07 MED ORDER — ONDANSETRON HCL 4 MG/2ML IJ SOLN: 4.0000 mg | INTRAMUSCULAR | Status: DC | PRN

## 2015-03-07 MED ORDER — DIBUCAINE 1 % RE OINT: 1.0000 "application " | TOPICAL_OINTMENT | RECTAL | Status: DC | PRN

## 2015-03-07 MED ORDER — LANOLIN HYDROUS EX OINT: TOPICAL_OINTMENT | CUTANEOUS | Status: DC | PRN

## 2015-03-07 MED ORDER — PANTOPRAZOLE SODIUM 20 MG PO TBEC
20.0000 mg | DELAYED_RELEASE_TABLET | Freq: Every day | ORAL | Status: DC
  Administered 2015-03-07: 20 mg via ORAL
  Filled 2015-03-07 (×3): qty 1

## 2015-03-07 MED ORDER — ACETAMINOPHEN 325 MG PO TABS: 650.0000 mg | ORAL_TABLET | ORAL | Status: DC | PRN

## 2015-03-07 MED ORDER — SENNOSIDES-DOCUSATE SODIUM 8.6-50 MG PO TABS
2.0000 | ORAL_TABLET | ORAL | Status: DC
  Administered 2015-03-08: 2 via ORAL
  Filled 2015-03-07: qty 2

## 2015-03-07 MED ORDER — PRENATAL MULTIVITAMIN CH
1.0000 | ORAL_TABLET | Freq: Every day | ORAL | Status: DC
  Filled 2015-03-07 (×2): qty 1

## 2015-03-07 MED ORDER — MEASLES, MUMPS & RUBELLA VAC ~~LOC~~ INJ
0.5000 mL | INJECTION | Freq: Once | SUBCUTANEOUS | Status: DC
Start: 2015-03-07 — End: 2015-03-08
  Filled 2015-03-07: qty 0.5

## 2015-03-07 MED ORDER — WITCH HAZEL-GLYCERIN EX PADS: 1.0000 "application " | MEDICATED_PAD | CUTANEOUS | Status: DC | PRN

## 2015-03-07 MED ORDER — SIMETHICONE 80 MG PO CHEW: 80.0000 mg | CHEWABLE_TABLET | ORAL | Status: DC | PRN

## 2015-03-07 MED ORDER — OXYCODONE-ACETAMINOPHEN 5-325 MG PO TABS: 2.0000 | ORAL_TABLET | ORAL | Status: DC | PRN

## 2015-03-07 MED ORDER — BENZOCAINE-MENTHOL 20-0.5 % EX AERO: 1.0000 "application " | INHALATION_SPRAY | CUTANEOUS | Status: DC | PRN

## 2015-03-07 MED ORDER — IBUPROFEN 600 MG PO TABS
600.0000 mg | ORAL_TABLET | Freq: Four times a day (QID) | ORAL | Status: DC
  Administered 2015-03-07 – 2015-03-08 (×5): 600 mg via ORAL
  Filled 2015-03-07 (×7): qty 1

## 2015-03-07 MED ORDER — TETANUS-DIPHTH-ACELL PERTUSSIS 5-2.5-18.5 LF-MCG/0.5 IM SUSP: 0.5000 mL | Freq: Once | INTRAMUSCULAR | Status: DC

## 2015-03-07 NOTE — Lactation Note (Signed)
This note was copied from the chart of [name]. Lactation Consultation Note  Baby is BF well.  Mom was working with Gaylan Gerold IBCLC when I came into the room.  She is open to all assistance.  Follow-up tomorrow.  INformation on support groups and outpatient services have been given to her. Patient Name: [name] Date: 03/07/2015 Reason for consult: Initial assessment   Maternal Data Has patient been taught Hand Expression?: Yes  Feeding Feeding Type: Breast Fed Length of feed: 25 min  LATCH Score/Interventions Latch: Grasps breast easily, tongue down, lips flanged, rhythmical sucking.  Audible Swallowing: Spontaneous and intermittent  Type of Nipple: Everted at rest and after stimulation  Comfort (Breast/Nipple): Soft / non-tender     Hold (Positioning): Assistance needed to correctly position infant at breast and maintain latch.  LATCH Score: 9  Lactation Tools Discussed/Used     Consult Status      [name] 03/07/2015, 5:04 PM

## 2015-03-07 NOTE — Progress Notes (Signed)
[name]  Post Partum Day 1:S/P SVD with 2nd Degree Laceration  Subjective: Patient up ad lib, denies syncope or dizziness. Reports consuming regular diet without issues and denies vomiting, but reports nausea and stomach pain.  However, patient reports this is a normal occurrence. Denies issues with urination and reports bleeding is "alright."  Patient is breastfeeding.  Desires nothing for postpartum contraception.  Pain is being appropriately managed with use of motrin.  Objective: Filed Vitals:   03/07/15 0101 03/07/15 0136 03/07/15 0240 03/07/15 0446  BP: 101/81 132/62 102/62 127/81  Pulse: 98 98 102 109  Temp:  99.3 F (37.4 C) 99 F (37.2 C) 99.4 F (37.4 C)  TempSrc:  Oral Oral Oral  Resp:  18 18 17   Height:      Weight:        Recent Labs  03/06/15 2120 03/07/15 0524  HGB 12.5 11.7*  HCT 37.3 35.3*    Physical Exam:  General: alert, cooperative, no distress and moderately obese Mood/Affect: Flat Lungs: clear to auscultation, no wheezes, rales or rhonchi, symmetric air entry.  Heart: normal rate and regular rhythm. Breast: not examined. Abdomen:  + bowel sounds, Soft, NT Uterine Fundus: firm Lochia: appropriate Laceration: Not examined Skin:Warm, Dry. DVT Evaluation: No evidence of DVT seen on physical exam. No cords or calf tenderness. No significant calf/ankle edema.  Assessment S/P Vaginal Delivery-Day 1 Normal Involution Breast Feeding Nausea  Plan: Patient declines anti-emetic Continue current care Plan for d/c tomorrow Dr. N.Dillard to be updated on patient status   [name], Melinda Found, MSN, CNM 03/07/2015, 8:41 AM

## 2015-03-08 ENCOUNTER — Encounter (HOSPITAL_COMMUNITY): Payer: Self-pay | Admitting: *Deleted

## 2015-03-08 LAB — CBC
HCT: 34.6 % — ABNORMAL LOW (ref 36.0–46.0)
Hemoglobin: 11.1 g/dL — ABNORMAL LOW (ref 12.0–15.0)
MCH: 26.3 pg (ref 26.0–34.0)
MCHC: 32.1 g/dL (ref 30.0–36.0)
MCV: 82 fL (ref 78.0–100.0)
PLATELETS: 181 10*3/uL (ref 150–400)
RBC: 4.22 MIL/uL (ref 3.87–5.11)
RDW: 15.8 % — AB (ref 11.5–15.5)
WBC: 13.8 10*3/uL — ABNORMAL HIGH (ref 4.0–10.5)

## 2015-03-08 MED ORDER — IBUPROFEN 600 MG PO TABS: 600.0000 mg | ORAL_TABLET | Freq: Four times a day (QID) | ORAL | Status: DC | PRN

## 2015-03-08 NOTE — Plan of Care (Signed)
Problem: Discharge Progression Outcomes Goal: MMR given as ordered Outcome: Not Applicable Date Met:  64/68/03 Declined mmr vaccine

## 2015-03-08 NOTE — Discharge Instructions (Signed)

## 2015-03-08 NOTE — Discharge Summary (Signed)
  Vaginal Delivery Discharge Summary  [name]  DOB:    25-Aug-1985 MRN:    [id number] CSN:    [id number]  Date of admission:                  03/06/15  Date of discharge:                   03/08/15  Procedures this admission:   SVB, repair of 2nd degree perineal laceration  Date of Delivery: 03/06/15  Newborn Data:  Live born female  Birth Weight: 8 lb 14.2 oz (4031 g) APGAR: 4, 9  Home with mother. Name: [name] Circumcision Plan: No circ  History of Present Illness:  [name] is a 29 y.o. female, G2P1001, who presents at [redacted]w[redacted]d weeks gestation. The patient has been followed at [facility] and Gynecology division of [facility]. She was admitted for onset of labor. Her pregnancy has been complicated by:  Patient Active Problem List   Diagnosis Date Noted  . Vaginal delivery 03/07/2015  . Allergic reaction to penicillin 03/06/2015  . Anxiety 03/06/2015  . 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 Stomach spasm 03/06/2015  . Second degree perineal laceration 03/06/2015  . Obesity, Class III, BMI 40-49.9 (morbid obesity) 02/11/2014  . 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 Amenorrhea 11/19/2013  . PCOS (polycystic ovarian syndrome) 09/30/2013  . Migraines   . Depression      Hospital Course:   Admitting Dx:  IUP at 40 4/7 weeks, active labor GBS Status:  Negative Delivering Clinician: [name], CNM Lacerations/MLE: 2nd degree perineal laceration Complications: None  Admitted in active labor, with plan for unmedicated labor and waterbirth.  Patient was very uncomfortable, at 5 cm, utilized birth tub briefly, then requested an epidural.  Prior to placement, she had SROM, with rapid progression to delivery after that.    Intrapartum Procedures: spontaneous vaginal delivery Postpartum Procedures: none Complications-Operative and Postpartum: 2nd degree perineal laceration  Discharge Diagnoses:  Term Pregnancy-delivered, SVB, 2nd degree perineal laceration  Feeding:  breast  Contraception:  no method  Hemoglobin Results:  CBC Latest Ref Rng 03/08/2015 03/07/2015 03/06/2015  WBC 4.0 - 10.5 K/uL 13.8(H) 22.4(H) 15.1(H)  Hemoglobin 12.0 - 15.0 g/dL 11.1(L) 11.7(L) 12.5  Hematocrit 36.0 - 46.0 % 34.6(L) 35.3(L) 37.3  Platelets 150 - 400 K/uL 181 204 203    Discharge Physical Exam:   General: alert Lochia: appropriate Uterine Fundus: firm Incision: healing well DVT Evaluation: No evidence of DVT seen on physical exam. Negative Homan's sign.   Discharge Information:  Activity:           pelvic rest Diet:                routine Medications: Ibuprofen Condition:      stable Instructions:  Routine pp instructions   Discharge to: home  Follow-up Information    Follow up with [facility]. Schedule an appointment as soon as possible for a visit in 6 weeks.   Specialty:  Obstetrics and Gynecology   Why:  Call for any questions or concerns.   Contact information:   [address] [place] [address] [phone]       Donnel Saxon [facility] 03/08/2015 8:34 AM

## 2015-03-08 NOTE — Plan of Care (Signed)
Problem: Discharge Progression Outcomes Goal: MMR given as ordered Outcome: Not Met (add Reason) Declines mmr

## 2015-03-08 NOTE — Lactation Note (Signed)
This note was copied from the chart of [name]. Lactation Consultation Note  Patient Name: Melinda Jennetta Flood OTRRN'H Date: 03/08/2015 Reason for consult: Follow-up assessment  With this mom and term baby, now 77 hours old. Mom denies any questions/concerns at this time. The baby is presently having a serum bilirubin being drawn, to see if her needs to go home on phototherapy or not. 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    Maternal Data    Feeding Feeding Type: Breast Fed Length of feed: 12 min  LATCH Score/Interventions                      Lactation Tools Discussed/Used     Consult Status Consult Status: Complete Follow-up type: Other (comment) (mom is working with aprivate LBCLC/doula)    Tonna Corner 03/08/2015, 11:08 AM

## 2015-05-31 ENCOUNTER — Telehealth: Payer: Self-pay | Admitting: Obstetrics and Gynecology

## 2015-05-31 NOTE — Telephone Encounter (Signed)
Patient returning call. Ok to call cell number if she does not answer on home number.

## 2015-05-31 NOTE — Telephone Encounter (Signed)
Spoke with patient. Advised I have spoken with Dr.Miller who would like to see her in the office for evaluation prior to having an ultrasound. Advised can be seen tomorrow afternoon and if she needs an ultrasound we can try to have it performed at the same appointment if the ultrasound schedule allows. Patient is agreeable. OV scheduled for tomorrow 10/25 at 2:30 pm with Dr.Miller. Patient is agreeable to date and time.  Routing to provider for final review. Patient agreeable to disposition.

## 2015-05-31 NOTE — Telephone Encounter (Signed)
Patient says she is having symptoms of a cyst again and would like to schedule an ultrasound appointment.

## 2015-05-31 NOTE — Telephone Encounter (Signed)
Spoke with patient. Patient states that she has been having intermittent pain in her "right ovary" since October 5th. When pain occurs it is sharp. "I have had problems with my ovaries before so I really think that is what this is." Denies any current pain. Is requesting to be seen in office for a ultrasound for evaluation. Advised I will speak with Dr.Miller regarding recommendations and return call. Patient was last seen in office on 07/16/2014 for viability ultrasound. Had her son 2 1/2 months ago.

## 2015-05-31 NOTE — Telephone Encounter (Signed)
Routing to Dr.Miller for review and advise. 

## 2015-06-01 ENCOUNTER — Ambulatory Visit (INDEPENDENT_AMBULATORY_CARE_PROVIDER_SITE_OTHER): Payer: BLUE CROSS/BLUE SHIELD | Admitting: Obstetrics & Gynecology

## 2015-06-01 ENCOUNTER — Ambulatory Visit (INDEPENDENT_AMBULATORY_CARE_PROVIDER_SITE_OTHER): Payer: BLUE CROSS/BLUE SHIELD

## 2015-06-01 VITALS — BP 118/80 | HR 64 | Temp 99.0°F | Wt 242.0 lb

## 2015-06-01 DIAGNOSIS — R1031 Right lower quadrant pain: Secondary | ICD-10-CM

## 2015-06-01 NOTE — Progress Notes (Signed)
Subjective:     Patient ID: [name], female   DOB: 03-21-1986, 29 y.o.   MRN: [id number]  HPI 29 yo G2P2 MWF here for complaint of RLQ pain.  Pt feels this has increased over the past few weeks.  Delivered 03/06/15.  Pt is breast feeding.  Pt with hx of teratoma with associated carcinoid tumor.  Pt did see Dr. Marko Plume in consultation.  Pt has been released from her unless pt has a new issues.  Pt called with pain complaint and requested ultrasound.  As pt has delivery not quite three months ago, felt physical exam first was important.    Pt is having some mild urinary incontinence that started after delivery.  She does not feel this is "normal" and wants some suggestions about what to do.    She is not bleeding due to amenorrhea.  Review of Systems  All other systems reviewed and are negative.      Objective:   Physical Exam  Constitutional: She is oriented to person, place, and time. She appears well-developed and well-nourished.  Abdominal: Soft. Bowel sounds are normal. She exhibits no distension and no mass. There is no tenderness. There is no rebound and no guarding. Hernia confirmed negative in the right inguinal area and confirmed negative in the left inguinal area.  Genitourinary: Vagina normal and uterus normal. There is no rash, tenderness, lesion or injury on the right labia. There is no rash, tenderness, lesion or injury on the left labia. Cervix exhibits no motion tenderness. Right adnexum displays no mass, no tenderness and no fullness. Left adnexum displays no mass, no tenderness and no fullness.  Can reproduce pain with palpation of pelvic floor on the right.  She does have pain on the left side as well.    Lymphadenopathy:       Right: No inguinal adenopathy present.       Left: No inguinal adenopathy present.  Neurological: She is alert and oriented to person, place, and time.  Skin: Skin is warm and dry.  Psychiatric: She has a normal mood and affect.   After  physical exam, PUS performed today. Uterus 7.6 x 4.9 x 3.3cm, no fibroids Endometrium 4.64mm Left ovary 3.2 x 1.9 x 1.7cm Right ovary 3.4 x 1.6 x 1.7cm, no masses, small follicles Cul de sac: no free     Assessment:     RLQ pain Pelvic floor pain  SUI    Plan:     As PUS was normal today and no evidence of teratoma, feel PT referral is appropriate.  Pain could be due to adhesions but this is less likely as has been of more recent onset.  About 15 minutes spent in discussion of ultrasound result, after physical exam.   In total, about 30 minutes spent with pt with >50%

## 2015-06-04 ENCOUNTER — Telehealth: Payer: Self-pay | Admitting: Obstetrics & Gynecology

## 2015-06-04 NOTE — Telephone Encounter (Signed)
Routing to [employer] for review and advise.

## 2015-06-04 NOTE — Telephone Encounter (Signed)
Patient called to check on the status of the referral for physical therapy. She said, "I was told to call if I didn't hear from them by today."

## 2015-06-05 ENCOUNTER — Encounter: Payer: Self-pay | Admitting: Obstetrics & Gynecology

## 2015-06-07 NOTE — Telephone Encounter (Signed)
Faxed waiting on appointment date/time. Called to notify patient. Left message to return call.

## 2015-07-27 NOTE — Telephone Encounter (Signed)
Has this been completed?

## 2015-10-14 ENCOUNTER — Encounter: Payer: Self-pay | Admitting: Obstetrics and Gynecology

## 2015-10-14 ENCOUNTER — Ambulatory Visit (INDEPENDENT_AMBULATORY_CARE_PROVIDER_SITE_OTHER): Payer: BLUE CROSS/BLUE SHIELD | Admitting: Obstetrics and Gynecology

## 2015-10-14 VITALS — BP 110/78 | HR 80 | Resp 14 | Ht 64.25 in | Wt 237.0 lb

## 2015-10-14 DIAGNOSIS — N912 Amenorrhea, unspecified: Secondary | ICD-10-CM | POA: Diagnosis not present

## 2015-10-14 DIAGNOSIS — K625 Hemorrhage of anus and rectum: Secondary | ICD-10-CM

## 2015-10-14 DIAGNOSIS — F32A Depression, unspecified: Secondary | ICD-10-CM

## 2015-10-14 DIAGNOSIS — Z01419 Encounter for gynecological examination (general) (routine) without abnormal findings: Secondary | ICD-10-CM | POA: Diagnosis not present

## 2015-10-14 DIAGNOSIS — E282 Polycystic ovarian syndrome: Secondary | ICD-10-CM | POA: Diagnosis not present

## 2015-10-14 DIAGNOSIS — Z0000 Encounter for general adult medical examination without abnormal findings: Secondary | ICD-10-CM

## 2015-10-14 DIAGNOSIS — E663 Overweight: Secondary | ICD-10-CM

## 2015-10-14 DIAGNOSIS — F329 Major depressive disorder, single episode, unspecified: Secondary | ICD-10-CM | POA: Diagnosis not present

## 2015-10-14 NOTE — Progress Notes (Signed)
Patient ID: [name], female   DOB: 1986-01-13, 30 y.o.   MRN: [id number] 30 y.o. [id number] MarriedCaucasianF here for annual exam.  She has an intermittent rash on her panty line on the right. No itchy, slightly tender from being rubbed. She has had some blood in her stool on and off for the last few weeks. She doesn't have any pain, not aware of hemorrhoids externally. She has noticed BRB when she wipes after BM or in the toilet.  Baby is 7 months. Breast feeding, no menses. She didn't have regular cycles prior to getting pregnant. She didn't do well on the Micronor after her first child. Not interested in IUD's. She has had irritation from condoms. No latex allergy, tried the non spermicide, still had irritation.  She c/o mild depression. Not sleeping well. Feels more irritable, no thoughts of hurting self or others.      No LMP recorded.          Sexually active: Yes.    The current method of family planning is none. (breast feeding) Exercising: No.  The patient does not participate in regular exercise at present. Smoker:  no  Health Maintenance: Pap:  02-13-14 WNL History of abnormal Pap:  no MMG:  Never Colonoscopy:  Never BMD:   Never TDaP:  07-18-11 Gardasil: no   reports that she has never smoked. She has never used smokeless tobacco. She reports that she does not drink alcohol or use illicit drugs.Home with kids, 63 month old son, 8 year old girl.   Past Medical History  Diagnosis Date  . PCOS (polycystic ovarian syndrome)   . Migraines   . Depression   . Infertility, female     clomid  . Stomach problems     (migraines or spasms)  . Anxiety     Past Surgical History  Procedure Laterality Date  . Gallbladder surgery    . Wisdom tooth extraction    . Upper gi endoscopy  2011  . Colonoscopy  2011  . Laparoscopic ovarian cystectomy Right 12/24/2013    Procedure: LAPAROSCOPIC OVARIAN CYSTECTOMY;  Surgeon: Azalia Bilis, MD;  Location: [facility];  Service: Gynecology;   Laterality: Right;    Current Outpatient Prescriptions  Medication Sig Dispense Refill  . ibuprofen (ADVIL,MOTRIN) 600 MG tablet Take 1 tablet (600 mg total) by mouth every 6 (six) hours as needed. 30 tablet 2  . pyridOXINE (VITAMIN B-6) 100 MG tablet Take 100 mg by mouth daily.     No current facility-administered medications for this visit.    Family History  Problem Relation Age of Onset  . Anesthesia problems Neg Hx   . Hypotension Neg Hx   . Malignant hyperthermia Neg Hx   . Pseudochol deficiency Neg Hx   . Thyroid disease Mother   . Hyperlipidemia Father   . Cancer Father     prostate cancer  . Breast cancer Maternal Grandmother   . Cancer Maternal Grandmother     bone cancer  . Diabetes Maternal Grandfather     Review of Systems  Constitutional: Negative.   Eyes: Negative.   Respiratory: Negative.   Cardiovascular: Negative.   Gastrointestinal: Positive for abdominal pain and blood in stool.  Endocrine: Negative.   Genitourinary: Negative.        Rash in vaginal area  Musculoskeletal: Negative.   Skin: Negative.   Allergic/Immunologic: Negative.   Neurological: Positive for headaches.  Psychiatric/Behavioral: Negative.        Depression  Exam:   BP 110/78 mmHg  Pulse 80  Resp 14  Ht 5' 4.25" (1.632 m)  Wt 237 lb (107.502 kg)  BMI 40.36 kg/m2  Weight change: @WEIGHTCHANGE @ Height:   Height: 5' 4.25" (163.2 cm)  Ht Readings from Last 3 Encounters:  10/14/15 5' 4.25" (1.632 m)  03/06/15 5\' 4"  (1.626 m)  07/16/14 5\' 4"  (1.626 m)    General appearance: alert, cooperative and appears stated age Head: Normocephalic, without obvious abnormality, atraumatic Neck: no adenopathy, supple, symmetrical, trachea midline and thyroid normal to inspection and palpation Lungs: clear to auscultation bilaterally Breasts: normal appearance, no masses or tenderness Heart: regular rate and rhythm Abdomen: soft, non-tender; bowel sounds normal; no masses,  no  organomegaly Extremities: extremities normal, atraumatic, no cyanosis or edema Skin: Skin color, texture, turgor normal. No rashes or lesions Lymph nodes: Cervical, supraclavicular, and axillary nodes normal. No abnormal inguinal nodes palpated Neurologic: Grossly normal   Pelvic: External genitalia:  no lesions, slight erythema/irritation to the right of the vulva in the panty line              Urethra:  normal appearing urethra with no masses, tenderness or lesions              Bartholins and Skenes: normal                 Vagina: normal appearing vagina with normal color and discharge, no lesions              Cervix: no lesions               Bimanual Exam:  Uterus:  normal size, contour, position, consistency, mobility, non-tender              Adnexa: no mass, fullness, tenderness               Rectovaginal: Confirms               Anus:  normal sphincter tone, no lesions  Chaperone was present for exam.  A:  Well Woman with normal exam  Contraception, we discussed all of the options for contraception. She will talk to her husband again about vasectomy and consider the diaphragm. She declines all hormonal options and  IUD's  H/O PCOS and abnormal cycles, discussed the risk of long term anovulation. 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 Discussed trying different underwear, discussed using Vaseline topically.  P:   No pap this year  Declines contraception, aware even with her history of infertility she could get pregnant  Discussed the option of therapy or medications for depression, #'s of therapists given  Discussed exercise  Discussed sleep  Discussed prenatal vitamins  Will refer to GI  In addition to the annual exam another 20 minutes was spent discussing depression and rectal bleeding

## 2016-02-24 ENCOUNTER — Telehealth: Payer: Self-pay | Admitting: Obstetrics and Gynecology

## 2016-02-24 DIAGNOSIS — Z131 Encounter for screening for diabetes mellitus: Secondary | ICD-10-CM

## 2016-02-24 NOTE — Telephone Encounter (Signed)
Spoke with patient and informed herthat we added the Hgb A1C-eh

## 2016-02-24 NOTE — Telephone Encounter (Signed)
Ordered a HgbA1C

## 2016-02-24 NOTE — Telephone Encounter (Signed)
Pt is calling to schedule lab appointment for orders entered at March 2017 visit.  Advised pt orders for lipid panel in system and have not expired.    Patient scheduled appointment for 02/25/16 @ 11am for fasting labs.  Pt would like to know if her sugar can be checked at the same time.  Please advise.

## 2016-02-25 ENCOUNTER — Other Ambulatory Visit: Payer: BLUE CROSS/BLUE SHIELD

## 2016-02-25 DIAGNOSIS — Z131 Encounter for screening for diabetes mellitus: Secondary | ICD-10-CM

## 2016-02-25 DIAGNOSIS — Z0000 Encounter for general adult medical examination without abnormal findings: Secondary | ICD-10-CM

## 2016-02-25 DIAGNOSIS — E663 Overweight: Secondary | ICD-10-CM

## 2016-02-25 LAB — LIPID PANEL
CHOLESTEROL: 204 mg/dL — AB (ref 125–200)
HDL: 49 mg/dL (ref >=46)
LDL Cholesterol: 134 mg/dL — ABNORMAL HIGH (ref <130)
TRIGLYCERIDES: 106 mg/dL (ref <150)
Total CHOL/HDL Ratio: 4.2 Ratio (ref <=5.0)
VLDL: 21 mg/dL (ref <30)

## 2016-02-25 LAB — HEMOGLOBIN A1C
Hgb A1c MFr Bld: 5.7 % — ABNORMAL HIGH (ref <5.7)
Mean Plasma Glucose: 117 mg/dL

## 2016-03-02 ENCOUNTER — Encounter: Payer: Self-pay | Admitting: Obstetrics and Gynecology

## 2016-03-02 NOTE — Telephone Encounter (Signed)
My Chart message to patient. Recommend office visit to discuss questions regarding lab results.   Routing to provider for final review. Patient agreeable to disposition. Will close encounter.

## 2016-03-09 ENCOUNTER — Telehealth: Payer: Self-pay | Admitting: Obstetrics and Gynecology

## 2016-03-09 NOTE — Telephone Encounter (Signed)
Spoke with the patient. She has had issues with eating healthy and weight loss.  She has depression, improved some. No thoughts of hurting self or others. No energy. Declines counseling and medication.  She has had issues with her stomach for years off and on, worse lately. She gets episodes of severe upper abdominal pain with nausea, then gets to a dull ache.  She was told she had "stomach migraines", helped with prozac, but had side effects. Also has issues with intermittent abdominal cramps and diarrhea.  We discussed trying to exercise. Discussed weight watchers. 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 Talked about trying to get some time to herself.  Discussed sending her to a nutritionist, she will call if she wants a referral (pre-diabetes, elevated lipids, overweight)

## 2016-05-01 ENCOUNTER — Telehealth: Payer: Self-pay | Admitting: Obstetrics and Gynecology

## 2016-05-01 MED ORDER — SERTRALINE HCL 50 MG PO TABS: 50.0000 mg | ORAL_TABLET | Freq: Every day | ORAL | 1 refills | Status: DC

## 2016-05-01 NOTE — Telephone Encounter (Signed)
Patient would like to speak with nurse about getting a prescription for an antidepressant that was discussed with Dr Talbert Nan.

## 2016-05-01 NOTE — Telephone Encounter (Signed)
Spoke with patient. Patient states talked with Dr. Talbert Nan 03/09/16 in depth about post-partum depression and medications to treat. Patient states she feels the "anxiety" and "feeling of dread" is more "manageable", but states the intermittent feeling of "rage and anger" are becoming more frequent. Patient states this bothers her. Patient denies suicidal/ homicidal ideations at this time. Patient states she is at the point she needs medication to help control these feelings. Patient states she has not followed up with GI as recommended previously or any counseling. Patient reports she is currently breastfeeding. Patient states Prozac worked for her in past, but concerned with use during pregnancy and long-term use. Advised patient would review with Dr. Talbert Nan for recommendations and return call. Patient is agreeable.    Dr. Talbert Nan, please advise?

## 2016-05-01 NOTE — Telephone Encounter (Signed)
I would recommend she start Zoloft 50 mg po qd. Zoloft is considered a first line medication for depression in nursing mothers. If she is agreeable start her on 50 mg po qd, f/u in 1 month (call in 30 with 1 refill). Please also give her a list of counselors.

## 2016-05-01 NOTE — Telephone Encounter (Signed)
You can take Prozac with nursing, but it is not considered as safe as the Zoloft. Prozac has a longer half life and is more likely to cause elevated blood levels in the infant than the Zoloft. At least one study showed a decrease in growth in babies who were nursing when moms were on the Zoloft.  They are both in the same category of medication, both SSRI's. If she still prefers Prozac, I can call it in for her, but my preference would be to give her Zoloft. She can also come in to discuss if she has questions.

## 2016-05-01 NOTE — Telephone Encounter (Signed)
Spoke with patient. Patient states she concerned about starting a new medication. Patient states she was given Prozac by physicians at [facility] with previous pregnancy and know this works. Patient states if Zoloft was the first line for nursing mothers, why wasn't she given Zoloft to begin with. Advised I would review with Dr. Talbert Nan and return call.    Dr. Talbert Nan, please advise.

## 2016-05-01 NOTE — Telephone Encounter (Signed)
Spoke with patient. Advised patient as seen below per Dr. Talbert Nan. Patient agreeable to Zoloft; sent to [facility], [address]; as requested by patient. Offered information for counseling, patient declined info. Scheduled 1 month follow-up appointment for 06/06/16 at 3:15pm with Dr. Talbert Nan. Advised patient should she have any additional questions/concerns, please call the office. Patient is agreeable to date and time.   Routing to provider for final review. Patient is agreeable to disposition. Will close encounter.

## 2016-05-02 ENCOUNTER — Telehealth: Payer: Self-pay | Admitting: Obstetrics and Gynecology

## 2016-05-02 NOTE — Telephone Encounter (Signed)
Spoke with patient. Patient states started on Zoloft 05/02/16 50 mg daily. Patient states she took the medication at 9:30am and reports by 11:30am feeling nauseous, dizzy, drowsy, and foggy. Patient complains of headache, body "feels weird and heavy". Patient states she can breathe "fine", but has to take deep breaths. Asked the patient how she felt currently, patient states the same, but "maybe the fogginess is better". Patient states she feels like the medication makes her a "safety" issue for her children. Patient wants to know a "timeframe" of these side effects and if it is normal? Patient states pharmacist reviewed side effects of medication when she picked up the RX. Advised when taking a new medication your body requires an adjustment period and the side effects are common. Advised patient Dr. Talbert Nan out of the office, would review with covering provider for further recommendations and return call.    Dr. Sabra Heck, please also see telephone encounter dated 05/01/16 and advise.    CC: Dr. Talbert Nan

## 2016-05-02 NOTE — Telephone Encounter (Signed)
Ok to stop and make sure symptoms resolve.  Once they do, can try Prozac as suggested by Dr. Talbert Nan.  Give a call back in a day or two.

## 2016-05-02 NOTE — Telephone Encounter (Signed)
Patient called and said, "I need to speak to Dr. Gentry Fitz nurse. I started taking Zoloft today but I am having nausea, trouble keeping my thought together, dizziness, and lots of other problems."

## 2016-05-02 NOTE — Telephone Encounter (Signed)
Spoke with patient. Advised as seen below per Dr. Sabra Heck. Patient is agreeable and verbalizes understanding.    Will leave encounter open for follow-up.   CC: Dr. Talbert Nan

## 2016-05-05 MED ORDER — FLUOXETINE HCL 10 MG PO CAPS: 10.0000 mg | ORAL_CAPSULE | Freq: Every day | ORAL | 0 refills | Status: DC

## 2016-05-05 NOTE — Telephone Encounter (Signed)
Ok to start with 10mg  prozac.  Can send in RX for #30/0RF.  Pt can give update in two weeks and if doesn't feel is helping, can increase to 20mg  then.  This is a good way to slowly start the medication.

## 2016-05-05 NOTE — Telephone Encounter (Signed)
Left message to call Aviv Lengacher at [phone]. 

## 2016-05-05 NOTE — Telephone Encounter (Signed)
Patient returning your call.

## 2016-05-05 NOTE — Telephone Encounter (Signed)
Spoke with patient. Advised of message as seen below from [place]. Patient is agreeable and verbalizes understanding. Rx for Prozac 10 mg #30 0RF sent to pharmacy on file. Patient is agreeable and will contact the office in two weeks to provide an update on how she is feeling. She will contact the office prior to that if she has any questions or concerns.  Cc: Dr.Jertson  Routing to covering provider for final review. Patient agreeable to disposition. Will close encounter.

## 2016-05-05 NOTE — Telephone Encounter (Signed)
Spoke with patient. Patient states her symptoms have resolved from Zoloft and she is returning call as requested per Dr. Sabra Heck as seen below. Patient states if Prozac is recommended she states no more than 20mg  daily. Advised will review with covering provider and return call. Advised Dr. Sabra Heck is seeing patients today and return call may not be immediate. Patient is agreeable.    Please also see telephone encounter dated 05/01/16.   Dr. Sabra Heck, please advise?   CC: Dr. Talbert Nan

## 2016-05-19 ENCOUNTER — Other Ambulatory Visit: Payer: Self-pay | Admitting: Obstetrics and Gynecology

## 2016-05-19 ENCOUNTER — Telehealth: Payer: Self-pay | Admitting: Obstetrics & Gynecology

## 2016-05-19 MED ORDER — FLUOXETINE HCL 20 MG PO CAPS: 20.0000 mg | ORAL_CAPSULE | Freq: Every day | ORAL | 2 refills | Status: DC

## 2016-05-19 NOTE — Telephone Encounter (Signed)
Ok to increase to Prozac 20 mg daily.  She can take Prozac 10 mg, 2 tab po q day. Let me know if she needs a new prescription.  Keep appointment for recheck at the end of October.   Cc- Dr. Talbert Nan

## 2016-05-19 NOTE — Telephone Encounter (Signed)
I send in a prescription for Prozac 20 mg daily, #30, RF 2.

## 2016-05-19 NOTE — Telephone Encounter (Signed)
Patient states she started Prozac 2 weeks ago.  She needs to speak with someone about it.

## 2016-05-19 NOTE — Telephone Encounter (Signed)
Spoke with patient. Advised of Dr. Elza Rafter recommendations as seen below. Reviewed upcoming appt for 06/06/16 at 3:15pm with Dr. Talbert Nan. Patient states will need new prescription; verified pharmacy on file. Advised to call with any additional questions/concerns. Patient verbalizes understanding.    Dr. Quincy Simmonds, patient does need new prescription.    Cc: Dr. Talbert Nan

## 2016-05-19 NOTE — Telephone Encounter (Signed)
Spoke with patient. Advised new prescription sent in. Patient verbalizes understanding and is thankful.

## 2016-05-19 NOTE — Telephone Encounter (Signed)
Spoke with patient. Patient calling to give 2 week update as recommended by Dr. Sabra Heck per telephone encounter dated 05/02/16. Asked patient how she was feeling in reference to "anxiety" and "rage and anger", that was previously noted. Patient states "maybe a little better, but no" and "maybe small difference, 20% better". Patient request increase in medication to 20 mg as previously recommended. Denies negative side effects of medication as was previously experiencing with Zoloft. Advised Dr. Talbert Nan is  out of the office, would review with covering provider and return call. Patient is agreeable.     Dr. Quincy Simmonds, okay to increase to Prozac to 20 mg?      Megan Salon, MD  to Me   1:01 PM  Note    Ok to start with 10mg  prozac.  Can send in RX for #30/0RF.  Pt can give update in two weeks and if doesn't feel is helping, can increase to 20mg  then.  This is a good way to slowly start the medication.       Cc: Dr. Talbert Nan, Dr. Sabra Heck

## 2016-05-19 NOTE — Telephone Encounter (Signed)
Left message to return call to [place] at [phone].

## 2016-06-06 ENCOUNTER — Encounter: Payer: Self-pay | Admitting: Obstetrics and Gynecology

## 2016-06-06 ENCOUNTER — Ambulatory Visit (INDEPENDENT_AMBULATORY_CARE_PROVIDER_SITE_OTHER): Payer: BLUE CROSS/BLUE SHIELD | Admitting: Obstetrics and Gynecology

## 2016-06-06 VITALS — BP 98/60 | HR 84 | Resp 16 | Wt 239.0 lb

## 2016-06-06 DIAGNOSIS — R51 Headache: Secondary | ICD-10-CM | POA: Diagnosis not present

## 2016-06-06 DIAGNOSIS — F418 Other specified anxiety disorders: Secondary | ICD-10-CM | POA: Diagnosis not present

## 2016-06-06 DIAGNOSIS — G8929 Other chronic pain: Secondary | ICD-10-CM

## 2016-06-06 NOTE — Progress Notes (Signed)
GYNECOLOGY  VISIT   HPI: 30 y.o.   Married  Caucasian  female   G2P2002 with No LMP recorded.   here for follow up depression. She was started on Prozac a month ago for depression, she is nursing. She tried zoloft for 1 day, had a bad reaction, trouble forming her sentences, very nauseas, dizzy, weak, felt like she would pass out. She is tolerating a Prozac fine. On Prozac the anger, rage has improved almost completely (directed towards her husband, never thoughts of hurting anyone). Her headaches are worse since starting the Prozac. She still has low drive, motivation, desire to get things done. She was on 10 mg of Prozac for 2 weeks and has now been on the 20 mg dose for 2 weeks.  She is exhausted, son doesn't sleep. Baby is up 3-4 x on a good night, teething. Baby is 15 months.  Not crying, not overly sad. Anxiety has improved 50%.   She was getting headaches prior to the Prozac, worse on the Prozac. Some migraines (no aura's), mostly just non migraine headaches. Went from headaches a couple of days a week to almost constant headaches (can be low level 2/10, or up to a 8/10 in severity). Occasionally takes Ibuprofen, doesn't always help. In the past when she was on Prozac she had headaches, thinks they did get better over time.   GYNECOLOGIC HISTORY: No LMP recorded. Contraception:none  Menopausal hormone therapy: none         OB History    Gravida Para Term Preterm AB Living   2 2 2     2    SAB TAB Ectopic Multiple Live Births         0 2         Patient Active Problem List   Diagnosis Date Noted  . Vaginal delivery 03/07/2015  . Allergic reaction to penicillin 03/06/2015  . Anxiety 03/06/2015  . 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 Stomach spasm 03/06/2015  . Second degree perineal laceration 03/06/2015  . Obesity, Class III, BMI 40-49.9 (morbid obesity) ([place]) 02/11/2014  . 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 Amenorrhea 11/19/2013  . PCOS  (polycystic ovarian syndrome) 09/30/2013  . Migraines   . Depression     Past Medical History:  Diagnosis Date  . Anxiety   . Depression   . Infertility, female    clomid  . Migraines   . PCOS (polycystic ovarian syndrome)   . Stomach problems    (migraines or spasms)    Past Surgical History:  Procedure Laterality Date  . COLONOSCOPY  2011  . GALLBLADDER SURGERY    . LAPAROSCOPIC OVARIAN CYSTECTOMY Right 12/24/2013   Procedure: LAPAROSCOPIC OVARIAN CYSTECTOMY;  Surgeon: Azalia Bilis, MD;  Location: [facility];  Service: Gynecology;  Laterality: Right;  . UPPER GI ENDOSCOPY  2011  . WISDOM TOOTH EXTRACTION      Current Outpatient Prescriptions  Medication Sig Dispense Refill  . FLUoxetine (PROZAC) 20 MG capsule Take 1 capsule (20 mg total) by mouth daily. 30 capsule 2  . ibuprofen (ADVIL,MOTRIN) 600 MG tablet Take 1 tablet (600 mg total) by mouth every 6 (six) hours as needed. 30 tablet 2  . Multiple Vitamin (MULTIVITAMIN) capsule Take 1 capsule by mouth daily.     No current facility-administered medications for this visit.      ALLERGIES: Penicillins  Family History  Problem Relation Age of Onset  . Anesthesia problems Neg Hx   . Hypotension  Neg Hx   . Malignant hyperthermia Neg Hx   . Pseudochol deficiency Neg Hx   . Thyroid disease Mother   . Hyperlipidemia Father   . Cancer Father     prostate cancer  . Breast cancer Maternal Grandmother   . Cancer Maternal Grandmother     bone cancer  . Diabetes Maternal Grandfather     Social History   Social History  . Marital status: Married    Spouse name: N/A  . Number of children: N/A  . Years of education: N/A   Occupational History  . Not on file.   Social History Main Topics  . Smoking status: Never Smoker  . Smokeless tobacco: Never Used  . Alcohol use No  . Drug use: No  . Sexual activity: Yes    Partners: Male    Birth control/ protection: None     Comment: none   Other Topics Concern  . Not  on file   Social History Narrative  . No narrative on file    Review of Systems  Constitutional: Negative.   HENT: Negative.   Eyes: Negative.   Respiratory: Negative.   Cardiovascular: Negative.   Gastrointestinal: Negative.   Genitourinary: Negative.   Musculoskeletal: Negative.   Skin: Negative.   Neurological: Negative.   Endo/Heme/Allergies: Negative.   Psychiatric/Behavioral: Positive for depression.    PHYSICAL EXAMINATION:    BP 98/60 (BP Location: Right Arm, Patient Position: Sitting, Cuff Size: Normal)   Pulse 84   Resp 16   Wt 239 lb (108.4 kg)   Breastfeeding? Yes   BMI 40.71 kg/m     General appearance: alert, cooperative and appears stated age  ASSESSMENT Depression/anxiety, improved with the Prozac. Didn't tolerate Zoloft Headaches, worsened on the Prozac    PLAN Overall feels better on the Prozac Will monitor her headaches, if getting worse or not improving may need to consider other options She will call if she wants to increase her dose of Prozac (still nursing, we discussed going up to 30 or 40 mg a day)   An After Visit Summary was printed and given to the patient.  Over 30 minutes face to face time of which over 50% was spent in counseling.

## 2016-08-12 ENCOUNTER — Other Ambulatory Visit: Payer: Self-pay | Admitting: Obstetrics and Gynecology

## 2016-08-14 NOTE — Telephone Encounter (Signed)
Medication refill request: prozac Last AEX:  10/14/15 JJ Next AEX: 10/24/16 SM  Last MMG (if hormonal medication request): None Refill authorized: 05/19/16 #30caps/2R. Today please advise.

## 2016-10-18 ENCOUNTER — Ambulatory Visit: Payer: Self-pay | Admitting: Obstetrics and Gynecology

## 2016-10-23 NOTE — Progress Notes (Signed)
31 y.o. G56P2002 Married Caucasian F here for annual exam.  Doing well.  Still nursing.  Son is 19 months.  Not cycling.    No LMP recorded. Patient is not currently having periods (Reason: Lactating).          Sexually active: Yes.    The current method of family planning is none.    Exercising: No.  The patient does not participate in regular exercise at present. Smoker:  no  Health Maintenance: Pap: 02/04/14 negative History of abnormal Pap:  no MMG:  never Colonoscopy:  06/07/10 normal  BMD:   never  TDaP:  07/18/11  Pneumonia vaccine(s):  never Zostavax:   never Hep C testing: not indicated  Screening Labs: discuss with provider, Hb today: same   reports that she has never smoked. She has never used smokeless tobacco. She reports that she does not drink alcohol or use drugs.  Past Medical History:  Diagnosis Date  . Anxiety   . Depression   . Infertility, female    clomid  . Migraines   . PCOS (polycystic ovarian syndrome)   . Stomach problems    (migraines or spasms)    Past Surgical History:  Procedure Laterality Date  . COLONOSCOPY  2011  . GALLBLADDER SURGERY    . LAPAROSCOPIC OVARIAN CYSTECTOMY Right 12/24/2013   Procedure: LAPAROSCOPIC OVARIAN CYSTECTOMY;  Surgeon: Azalia Bilis, MD;  Location: [facility];  Service: Gynecology;  Laterality: Right;  . UPPER GI ENDOSCOPY  2011  . WISDOM TOOTH EXTRACTION      Current Outpatient Prescriptions  Medication Sig Dispense Refill  . FLUoxetine (PROZAC) 20 MG capsule TAKE 1 CAPSULE (20 MG TOTAL) BY MOUTH DAILY. 30 capsule 2  . ibuprofen (ADVIL,MOTRIN) 600 MG tablet Take 1 tablet (600 mg total) by mouth every 6 (six) hours as needed. 30 tablet 2  . Multiple Vitamin (MULTIVITAMIN) capsule Take 1 capsule by mouth daily.     No current facility-administered medications for this visit.     Family History  Problem Relation Age of Onset  . Anesthesia problems Neg Hx   . Hypotension Neg Hx   . Malignant hyperthermia Neg Hx    . Pseudochol deficiency Neg Hx   . Thyroid disease Mother   . Hyperlipidemia Father   . Cancer Father     prostate cancer  . Breast cancer Maternal Grandmother   . Cancer Maternal Grandmother     bone cancer  . Diabetes Maternal Grandfather     ROS:  Pertinent items are noted in HPI.  Otherwise, a comprehensive ROS was negative.  Exam:   BP 106/82 (BP Location: Right Arm, Patient Position: Sitting, Cuff Size: Large)   Pulse 62   Resp 16   Ht 5\' 4"  (1.626 m)   Wt 245 lb (111.1 kg)   Breastfeeding? Yes   BMI 42.05 kg/m   Weight change: +2#   Height: 5\' 4"  (162.6 cm)  Ht Readings from Last 3 Encounters:  10/24/16 5\' 4"  (1.626 m)  10/14/15 5' 4.25" (1.632 m)  03/06/15 5\' 4"  (1.626 m)    General appearance: alert, cooperative and appears stated age Head: Normocephalic, without obvious abnormality, atraumatic Neck: no adenopathy, supple, symmetrical, trachea midline and thyroid normal to inspection and palpation Lungs: clear to auscultation bilaterally Breasts: normal appearance, no masses or tenderness Heart: regular rate and rhythm Abdomen: soft, non-tender; bowel sounds normal; no masses,  no organomegaly Extremities: extremities normal, atraumatic, no cyanosis or edema Skin: Skin color, texture, turgor  normal. No rashes or lesions Lymph nodes: Cervical, supraclavicular, and axillary nodes normal. No abnormal inguinal nodes palpated Neurologic: Grossly normal   Pelvic: External genitalia:  no lesions              Urethra:  normal appearing urethra with no masses, tenderness or lesions              Bartholins and Skenes: normal                 Vagina: normal appearing vagina with normal color and discharge, no lesions              Cervix: no lesions              Pap taken: Yes.   Bimanual Exam:  Uterus:  normal size, contour, position, consistency, mobility, non-tender              Adnexa: normal adnexa and no mass, fullness, tenderness               Rectovaginal:  Confirms               Anus:  normal sphincter tone, no lesions  Chaperone was present for exam.  A:  Well Woman with normal exam PCOS Amenorrhea, lactating D/W pt using Mirena IUD Family hx of breast cancer in [facility] H/O mature teratoma excision, normal ovaries on ultrasound 10/16  P:   Mammogram guidelines reviewed pap smear with HR HPV obtained Order for lipids and hba1c for this summer return annually or prn

## 2016-10-24 ENCOUNTER — Other Ambulatory Visit (HOSPITAL_COMMUNITY)
Admission: RE | Admit: 2016-10-24 | Discharge: 2016-10-24 | Disposition: A | Discharge status: Home / Self Care | Payer: BLUE CROSS/BLUE SHIELD | Source: Ambulatory Visit | Attending: Obstetrics & Gynecology | Admitting: Obstetrics & Gynecology

## 2016-10-24 ENCOUNTER — Encounter: Payer: Self-pay | Admitting: Obstetrics & Gynecology

## 2016-10-24 ENCOUNTER — Ambulatory Visit (INDEPENDENT_AMBULATORY_CARE_PROVIDER_SITE_OTHER): Payer: BLUE CROSS/BLUE SHIELD | Admitting: Obstetrics & Gynecology

## 2016-10-24 VITALS — BP 106/82 | HR 62 | Resp 16 | Ht 64.0 in | Wt 245.0 lb

## 2016-10-24 DIAGNOSIS — E282 Polycystic ovarian syndrome: Secondary | ICD-10-CM | POA: Diagnosis not present

## 2016-10-24 DIAGNOSIS — Z124 Encounter for screening for malignant neoplasm of cervix: Secondary | ICD-10-CM | POA: Diagnosis not present

## 2016-10-24 DIAGNOSIS — Z01419 Encounter for gynecological examination (general) (routine) without abnormal findings: Secondary | ICD-10-CM | POA: Diagnosis not present

## 2016-10-24 MED ORDER — HYDROCORTISONE 2.5 % RE CREA: 1.0000 "application " | TOPICAL_CREAM | Freq: Two times a day (BID) | RECTAL | 1 refills | Status: DC

## 2016-10-24 MED ORDER — FLUOXETINE HCL 20 MG PO CAPS: 20.0000 mg | ORAL_CAPSULE | Freq: Every day | ORAL | 4 refills | Status: DC

## 2016-10-25 ENCOUNTER — Encounter: Payer: Self-pay | Admitting: Obstetrics & Gynecology

## 2016-10-26 LAB — CYTOLOGY - PAP
DIAGNOSIS: NEGATIVE
HPV: NOT DETECTED

## 2016-11-27 ENCOUNTER — Telehealth: Payer: Self-pay | Admitting: Obstetrics & Gynecology

## 2016-11-27 NOTE — Telephone Encounter (Signed)
Ok to take 25mg  Benadryl but this might make her sleepy so she can start with 12.5mg  dosing.  Or 5mg  Zyrtec if she has it.    Likely will need another antibiotic prescribed as well.  Will add this as a reaction to her allergy list.

## 2016-11-27 NOTE — Telephone Encounter (Signed)
Spoke with patient. Patient states that a "clinic" that is a part of her PCP prescribed her Augmentin for a sinus infection. Finished course of medication on 11/24/2016. States she woke up this morning and her eyes and ears are swollen, she is itchy, has hives, and feels her throat is "tight." Denies any trouble breathing or swallowing. Advised patient she will need to contact her PCP office to discuss reaction. States she has contacted her PCP and was told she needed to wait until 12 pm when the clinic opens to return call. States "I just wanted them to let me know what I could do and they wont see me or talk to me." Patient has not taken any Benadryl as she was unsure if this was safe with breast feeding. States her son is 20 months. Patient only has children's Benadryl at home. Advised this is okay. Dosage will just have to be adjusted. Advised patient if she cannot seek care with PCP will need to see Urgent Care. Patient declines stating she has 2 young children at home and cannot leave. Asking for recommendations on what to do for symptoms. Advised will review with Dr.Miller and return call. Patient is agreeable.

## 2016-11-27 NOTE — Telephone Encounter (Signed)
Spoke with patient. Advised of message as seen below from [place]. Patient is agreeable and verbalizes understanding. Advised if symptoms worsen or develops new symptoms needs to seek evaluation with PCP or ER. Patient verbalizes understanding.  Routing to provider for final review. Patient agreeable to disposition. Will close encounter.

## 2016-11-27 NOTE — Telephone Encounter (Signed)
Patient is asking to talk with Dr. Ammie Ferrier nurse regarding  a reaction she is having after taking an antibiotic.

## 2016-12-20 ENCOUNTER — Encounter: Payer: Self-pay | Admitting: Gynecology

## 2017-03-06 NOTE — Addendum Note (Signed)
Addended by: Graylon Good on: 03/06/2017 09:51 AM   Modules accepted: Orders

## 2017-03-09 ENCOUNTER — Other Ambulatory Visit (INDEPENDENT_AMBULATORY_CARE_PROVIDER_SITE_OTHER): Payer: BLUE CROSS/BLUE SHIELD

## 2017-03-09 DIAGNOSIS — E282 Polycystic ovarian syndrome: Secondary | ICD-10-CM

## 2017-03-10 LAB — LIPID PANEL
CHOLESTEROL TOTAL: 223 mg/dL — AB (ref 100–199)
Chol/HDL Ratio: 4.8 ratio — ABNORMAL HIGH (ref 0.0–4.4)
HDL: 46 mg/dL (ref >39)
LDL Calculated: 151 mg/dL — ABNORMAL HIGH (ref 0–99)
Triglycerides: 130 mg/dL (ref 0–149)
VLDL CHOLESTEROL CAL: 26 mg/dL (ref 5–40)

## 2017-03-10 LAB — HEMOGLOBIN A1C
Est. average glucose Bld gHb Est-mCnc: 114 mg/dL
Hgb A1c MFr Bld: 5.6 % (ref 4.8–5.6)

## 2017-03-14 ENCOUNTER — Telehealth: Payer: Self-pay | Admitting: Obstetrics & Gynecology

## 2017-03-14 NOTE — Telephone Encounter (Signed)
Patient calling for results.

## 2017-03-14 NOTE — Telephone Encounter (Signed)
Spoke with patient, advised as seen below per Dr. Sabra Heck. Patient states she was not fasting, feels like the cholesterol may not be accurate. Patient states she is currently looking for a new PCP that practices more holistically, will return call with update to have copy of labs faxed. Patient declined assistance with assistance in scheduling or locating PCP. Patient verbalizes understanding and is agreeable.  Routing to provider for final review. Patient is agreeable to disposition. Will close encounter.      Notes recorded by Megan Salon, MD on 03/14/2017 at 11:05 AM EDT Please let pt know her cholestrol is elevated at 223 and her LDLs are 151. HbA1C is 5.6 This is just below pre-diabetes. Was 5.7 last year. This is at the border of requiring treatment. Needs to follow up with PCP.

## 2017-03-16 ENCOUNTER — Other Ambulatory Visit: Payer: BLUE CROSS/BLUE SHIELD

## 2017-04-06 ENCOUNTER — Telehealth: Payer: Self-pay | Admitting: Obstetrics & Gynecology

## 2017-04-06 NOTE — Telephone Encounter (Signed)
Patient started her period today states her and Dr Sabra Heck discussed the IUD.  Wondering if it changes the time line of the plan the had made.

## 2017-04-06 NOTE — Telephone Encounter (Signed)
Spoke with patient. Reports Hx of PCOS and no spontaneous menses in 7 years. Was still breastfeeding when when she seen Dr. Sabra Heck in 10/2016. Patient states options were discussed for "shedding her lining" since no spontaneous menses and waiting until "son backed off of nursing". Patient is still breastfeeding. States Mirena was an option discussed at that time. Patient states she has now started her menses, first one in 7 years, would like to discuss if this changes options or would IUD still be recommended?   Recommended OV for further discussion with Dr. Sabra Heck. Patient request 4pm appointment. Patient scheduled for 04/30/17 at 4pm. Advised patient would update Dr. Sabra Heck and return call with any additional recommendations, patient is agreeable.  Routing to provider for final review. Patient is agreeable to disposition. Will close encounter.

## 2017-04-30 ENCOUNTER — Ambulatory Visit (INDEPENDENT_AMBULATORY_CARE_PROVIDER_SITE_OTHER): Payer: BLUE CROSS/BLUE SHIELD | Admitting: Obstetrics & Gynecology

## 2017-04-30 ENCOUNTER — Encounter: Payer: Self-pay | Admitting: Obstetrics & Gynecology

## 2017-04-30 VITALS — BP 108/72 | HR 80 | Resp 14 | Wt 262.0 lb

## 2017-04-30 DIAGNOSIS — E282 Polycystic ovarian syndrome: Secondary | ICD-10-CM | POA: Diagnosis not present

## 2017-04-30 DIAGNOSIS — Z30430 Encounter for insertion of intrauterine contraceptive device: Secondary | ICD-10-CM

## 2017-04-30 DIAGNOSIS — N912 Amenorrhea, unspecified: Secondary | ICD-10-CM

## 2017-04-30 DIAGNOSIS — Z01812 Encounter for preprocedural laboratory examination: Secondary | ICD-10-CM

## 2017-04-30 LAB — POCT URINE PREGNANCY: PREG TEST UR: NEGATIVE

## 2017-04-30 NOTE — Progress Notes (Signed)
GYNECOLOGY  VISIT  CC:   IUD placement  HPI: 31 y.o. G59P2002 Married Caucasian female here for discussion of Mirena IUD placement.  Pt has hx of chronic anovulation due to PCOS and has oligo-ovulation.  She is still breast feeding and youngest child is two years old.  Had a cycle 04/06/17 which was the first cycle she's had in several years that was without a provera challenge.  At last visit, we discussed IUD for contraception and for endometrial protection so she would not have to continue provera challenges.  Once she had the cycle, she decided maybe it was time to proceed with IUD placement.  Here to discuss this today.  Pt has not been SA in 2 1/2 weeks when they were at the beach.  She is sure of exact date.  D/w pt will proceed with pregnancy test and, if negative, can proceed with placement today.  Would recommend endometrial biopsy as well due to chronic anovulation.  Pt comfortable with plan.Marland Kitchen  GYNECOLOGIC HISTORY: Patient's last menstrual period was 04/06/2017. Contraception: none  Patient Active Problem List   Diagnosis Date Noted  . Vaginal delivery 03/07/2015  . Allergic reaction to penicillin 03/06/2015  . Anxiety 03/06/2015  . 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 Stomach spasm 03/06/2015  . Second degree perineal laceration 03/06/2015  . Obesity, Class III, BMI 40-49.9 (morbid obesity) ([place]) 02/11/2014  . 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 Amenorrhea 11/19/2013  . PCOS (polycystic ovarian syndrome) 09/30/2013  . Migraines   . Depression     Past Medical History:  Diagnosis Date  . Anxiety   . Depression   . Infertility, female    clomid  . Migraines   . PCOS (polycystic ovarian syndrome)   . Stomach problems    (migraines or spasms)    Past Surgical History:  Procedure Laterality Date  . COLONOSCOPY  2011  . GALLBLADDER SURGERY    . LAPAROSCOPIC OVARIAN CYSTECTOMY Right 12/24/2013   Procedure: LAPAROSCOPIC OVARIAN CYSTECTOMY;   Surgeon: Azalia Bilis, MD;  Location: [facility];  Service: Gynecology;  Laterality: Right;  . UPPER GI ENDOSCOPY  2011  . WISDOM TOOTH EXTRACTION      MEDS:   Current Outpatient Prescriptions on File Prior to Visit  Medication Sig Dispense Refill  . FLUoxetine (PROZAC) 20 MG capsule Take 1 capsule (20 mg total) by mouth daily. 90 capsule 4  . hydrocortisone (ANUSOL-HC) 2.5 % rectal cream Place 1 application rectally 2 (two) times daily. Do not use for more than 7 days. 30 g 1  . ibuprofen (ADVIL,MOTRIN) 600 MG tablet Take 1 tablet (600 mg total) by mouth every 6 (six) hours as needed. 30 tablet 2   No current facility-administered medications on file prior to visit.     ALLERGIES: Augmentin [amoxicillin-pot clavulanate] and Penicillins  Family History  Problem Relation Age of Onset  . Thyroid disease Mother   . Hyperlipidemia Father   . Cancer Father        prostate cancer  . Breast cancer Maternal Grandmother   . Cancer Maternal Grandmother        bone cancer  . Diabetes Maternal Grandfather   . Anesthesia problems Neg Hx   . Hypotension Neg Hx   . Malignant hyperthermia Neg Hx   . Pseudochol deficiency Neg Hx     SH:  Married, non smoker  Review of Systems  All other systems reviewed and are negative.   PHYSICAL EXAMINATION:  BP 108/72 (BP Location: Right Arm, Patient Position: Sitting, Cuff Size: Large)   Pulse 80   Resp 14   Wt 262 lb (118.8 kg)   LMP 04/06/2017   Breastfeeding? Yes   BMI 44.97 kg/m     General appearance: alert, cooperative and appears stated age Abdomen: soft, non-tender; bowel sounds normal; no masses,  no organomegaly  Pelvic: External genitalia:  no lesions              Urethra:  normal appearing urethra with no masses, tenderness or lesions              Bartholins and Skenes: normal                 Vagina: normal appearing vagina with normal color and discharge, no lesions              Cervix: no lesions              Bimanual Exam:   Uterus:  normal size, contour, position, consistency, mobility, non-tender              Adnexa: no mass, fullness, tenderness              Anus:  no lesions  Endometrial biopsy recommended.  Discussed with patient.  Verbal and written consent obtained.   Procedure:  Speculum placed.  Cervix visualized and cleansed with betadine prep.  A single toothed tenaculum was applied to the anterior lip of the cervix.  Endometrial pipelle was advanced through the cervix into the endometrial cavity without difficulty.  Pipelle passed to 9cm.  Suction applied and pipelle removed with scant tissue sample obtained.  Second pass performed.  Then Mirena IUD with introducer was passed to fundus and withdrawn slightly before IUD released from introducer.  Introducer removed.  IUD string cut to 2cm.  Tenaculum removed.  No bleeding noted.  Patient tolerated procedure well.  Lot:  [id number].  Exp:  2/21  Chaperone was present for exam.  Assessment: Chronic anovulation and PCOS Contraceptive needs  Plan: Endometrial biopsy results will be called to pt Follow-up two months for recheck after IUD placement   ~15 minutes spent with patient >50% of time was in face to face discussion of above before endometrial biopsy and IUD placement.

## 2017-05-04 ENCOUNTER — Telehealth: Payer: Self-pay | Admitting: Obstetrics & Gynecology

## 2017-05-04 DIAGNOSIS — R102 Pelvic and perineal pain: Secondary | ICD-10-CM

## 2017-05-04 NOTE — Telephone Encounter (Signed)
Call to patient. Patient states she had Mirena IUD placed on 04/30/17. Patient states Dr. Sabra Heck told her to call the office if she was still having cramping after 2-3 days. Patient states that "it is nothing major, but I am having intermittent cramping." Pain score ranging anywhere from "nagging 1 to 5." Patient states "nothing makes it better or worse." Denies taking any medication for pain. States spotting has resolved. RN advised patient to monitor symptoms, can take ibuprofen if needed for pain. Patient agreeable. Urgent care/ER precautions reviewed with patient and she verbalized understanding. Patient aware RN will review with covering provider since Dr. Sabra Heck is out of the office today, and return call with any additional recommendations.  Routing to covering provider for review. Dr. Quincy Simmonds- any additional recommendations?   CC- Dr. Sabra Heck

## 2017-05-04 NOTE — Telephone Encounter (Signed)
Call to patient. Message given to patient as seen below from Dr. Quincy Simmonds. Patient agreeable. Scheduled for PUS on Thursday 05/10/17 at 1530 with 1600 consult with Dr. Sabra Heck. Patient agreeable to date and time of appointment. RN advised will review with Dr. Sabra Heck upon return to the office for any additional recommendations. Patient agreeable. Patient aware to be evaluated at [facility] over the weekend if condition worsens.   Routing to provider for final review.    CC- Dr. Sabra Heck

## 2017-05-04 NOTE — Telephone Encounter (Signed)
Patient had IUD placed earlier this week and is having cramping

## 2017-05-04 NOTE — Telephone Encounter (Signed)
I recommend a pelvic ultrasound in office with Dr. Sabra Heck next week if she is still cramping.  If has significant pain during the weekend, to [facility] for evaluation.   For now, try a heating pad and Ibuprofen 800 mg po q 8 hours prn.

## 2017-05-09 ENCOUNTER — Telehealth: Payer: Self-pay | Admitting: Obstetrics & Gynecology

## 2017-05-09 NOTE — Telephone Encounter (Signed)
Left message to call Kaitlyn at [phone]. 

## 2017-05-09 NOTE — Telephone Encounter (Signed)
Spoke with patient regarding ultrasound appointment on 05/10/17. Patient advises she will need to cancel appointment, due to not having childcare for her children.  Patient has rescheduled the recommended ultrasound appointment on 05/17/17 with Dr Sabra Heck. Patient is aware of appointment date, arrival time and cancellation policy.  Patient states she is still having intermittent cramping, "but nothing like I have to go to the hospital for."  Patient is asking if the appointment is still needed. Advised patient I would forward her question for out Triage Nurse for review.  Patient is agreeable.  Routing to Triage Nurse

## 2017-05-09 NOTE — Telephone Encounter (Signed)
Spoke with patient. Patient states that she is having mild spotting and cramping since her IUD was placed on 04/30/2017. Denies any sharp pain or heavy bleeding. Cramping is 4 or less on a scale of 1-10. Is not taking any pain medication for cramping. States she has read this is to be expected while your body adjusts to having an IUD. Wants to check with Dr.Miller as Dr.Silva recommended the PUS while Dr.Miller was out of the office. Also states her partner can feel IUD strings. "Dr.Miller told me they would soften, but I can't remember how long that will take or if I need them trimmed." Advised will review with Dr.Miller upon her return to office tomorrow and return call.

## 2017-05-09 NOTE — Telephone Encounter (Signed)
Patient returned call to Kaitlyn. °

## 2017-05-10 ENCOUNTER — Ambulatory Visit (INDEPENDENT_AMBULATORY_CARE_PROVIDER_SITE_OTHER): Payer: BLUE CROSS/BLUE SHIELD | Admitting: Obstetrics & Gynecology

## 2017-05-10 ENCOUNTER — Other Ambulatory Visit: Payer: Self-pay | Admitting: Obstetrics & Gynecology

## 2017-05-10 ENCOUNTER — Encounter: Payer: Self-pay | Admitting: Obstetrics & Gynecology

## 2017-05-10 ENCOUNTER — Telehealth: Payer: Self-pay | Admitting: Obstetrics & Gynecology

## 2017-05-10 ENCOUNTER — Ambulatory Visit (INDEPENDENT_AMBULATORY_CARE_PROVIDER_SITE_OTHER): Payer: BLUE CROSS/BLUE SHIELD

## 2017-05-10 ENCOUNTER — Other Ambulatory Visit: Payer: Self-pay

## 2017-05-10 VITALS — BP 112/84 | HR 78 | Resp 15 | Wt 262.0 lb

## 2017-05-10 DIAGNOSIS — Z30431 Encounter for routine checking of intrauterine contraceptive device: Secondary | ICD-10-CM

## 2017-05-10 DIAGNOSIS — R102 Pelvic and perineal pain: Secondary | ICD-10-CM | POA: Diagnosis not present

## 2017-05-10 DIAGNOSIS — N914 Secondary oligomenorrhea: Secondary | ICD-10-CM

## 2017-05-10 DIAGNOSIS — R935 Abnormal findings on diagnostic imaging of other abdominal regions, including retroperitoneum: Secondary | ICD-10-CM | POA: Diagnosis not present

## 2017-05-10 NOTE — Progress Notes (Signed)
31 y.o. G56P2002 Married Caucasian female here for pelvic ultrasound due to pelvic cramping since IUD placement 04/30/17.  Pt has long hx of anovulation and amenorrhea or oligomenorrhea.  Even though she is still nursing her youngest child, 74 1/2 years old, I had concerns about development for endometrial hyperplasia.  She did have a biopsy obtained 04/30/17 that was negative for abnormal cells.  Pt is bleeding today.  Had spotting for about three days after IUD placement.  She has been bleeding more like a cycle for the past three days.  No LMP recorded. Patient is not currently having periods (Reason: Lactating).  Contraception: IUD  Findings:  UTERUS: 8.7 x 4.7 x 3.2cm, thickened endometrium at the LUS with some vascularity noted, possible mass EMS:67mm with IUD in correct location ADNEXA: Left ovary:  2.5 x 1.7 x 1.9cm with 7 x 5 mm echogenic focus, possible dermoid       Right ovary: 2.5 x 1.9 x 1.8cm CUL DE SAC: no free fluid  Discussion:  Findings reviewed with pt.  As she is bleeding today and IUD has only been present about two weeks, feel it is ok to continue to monitor conservatively at this time.  Also, endometrial biopsy was negative for abnormal cells on 04/30/17.  Will plan to repeat PUS in two months.  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  ~15 minutes spent with patient >50% of time was in face to face discussion of above.

## 2017-05-10 NOTE — Telephone Encounter (Signed)
Patient says she has additional information for nurse.

## 2017-05-10 NOTE — Telephone Encounter (Signed)
Spoke with patient. Advised of message as seen below from [employer]. Patient verbalizes understanding. States that she will need to reschedule PUS to late afternoon appointment. PUS moved to 05/22/2017 at 4 pm with 4:15 pm appointment with Dr.Miller. Patient is agreeable to date and time. Aware to monitor symptoms and if symptoms worsen or develops new symptoms will need to contact the office.  Routing to provider for final review. Patient agreeable to disposition. Will close encounter.

## 2017-05-10 NOTE — Telephone Encounter (Signed)
This is the second phone call regarding this so I think an PUS is a good idea.  I can check her strings then and cut them shorter if needed.

## 2017-05-10 NOTE — Telephone Encounter (Signed)
Spoke with patient. Patient states that since speaking this morning her cramping has worsened. Asking if she can be seen today for original PUS appointment. Appointment scheduled for today 05/10/2017 at 3:30 pm with 4:15 pm consult with Dr.Miller. Patient is agreeable to date and time.  Routing to provider for final review. Patient agreeable to disposition. Will close encounter.

## 2017-05-14 ENCOUNTER — Telehealth: Payer: Self-pay | Admitting: *Deleted

## 2017-05-14 DIAGNOSIS — R935 Abnormal findings on diagnostic imaging of other abdominal regions, including retroperitoneum: Secondary | ICD-10-CM

## 2017-05-14 NOTE — Telephone Encounter (Signed)
-----   Message from Megan Salon, MD sent at 05/13/2017 10:30 PM EDT ----- Regarding: pt from thursday Pt was seen on Thursday.  Endometrium appeared abnormal but pt was cycling and just had IUD placed 04/30/17.  IUD in correct location.  Pt and I discussed repeating PUS 2 months.  Advised her I wanted to show to one of my partners.  Reviewed with Dr. Quincy Simmonds on Friday.  She agrees with plan.  Pt needs PUS scheduled for two months.  Please call her and advise.  She is aware this is the plan so I don't think will have a lot of questions about this.  Thanks.  Vinnie Level

## 2017-05-14 NOTE — Telephone Encounter (Signed)
Spoke with patient, scheduled for PUS on 07/05/17 at 3pm with consult to follow at 3:30pm with Dr. Sabra Heck. Cancelled OV scheduled for 07/06/17, 2 mo f/u. Patient verbalizes understanding and is agreeable.   Order placed for PUS.   Routing to provider for final review. Patient is agreeable to disposition. Will close encounter.  Cc: Lerry Liner

## 2017-05-17 ENCOUNTER — Other Ambulatory Visit: Payer: BLUE CROSS/BLUE SHIELD

## 2017-05-17 ENCOUNTER — Other Ambulatory Visit: Payer: BLUE CROSS/BLUE SHIELD | Admitting: Obstetrics & Gynecology

## 2017-05-22 ENCOUNTER — Other Ambulatory Visit: Payer: BLUE CROSS/BLUE SHIELD | Admitting: Obstetrics & Gynecology

## 2017-05-22 ENCOUNTER — Other Ambulatory Visit: Payer: BLUE CROSS/BLUE SHIELD

## 2017-05-28 ENCOUNTER — Telehealth: Payer: Self-pay

## 2017-05-28 ENCOUNTER — Encounter: Payer: Self-pay | Admitting: Obstetrics & Gynecology

## 2017-05-28 NOTE — Telephone Encounter (Signed)
My chart message:  I had my mirena inserted on Sept 24. I am still bleeding currently (it's been pretty heavy and hasn't let up since Oct 2) but last week I started having pieces (clumps) of tissue coming out once a day to every other day. I just wanted to make sure this was normal. Thank you.  Called patient at [phone] and left message on voicemail to call triage nurse back to discuss above "MyChart" message.

## 2017-05-29 NOTE — Telephone Encounter (Signed)
I think she should do her repeat ultrasound earlier as this bleeding is normal.  Ok to do this on Thursday if she is ok with this.[name]

## 2017-05-29 NOTE — Telephone Encounter (Signed)
Patient returned call to triage nurse.

## 2017-05-29 NOTE — Telephone Encounter (Signed)
Left message to call Kaitlyn at [phone]. 

## 2017-05-29 NOTE — Telephone Encounter (Signed)
Spoke with patient. Patient states that she had her Mirena IUD placed 04/30/2017. Had bleeding from 04/30/2017-05/02/2017. Bleeding restarted on 05/08/2017. Reports it has been consistently heavy. Is changing her super tampon every 2-4 hours. At night wear a pad and does not have to change it all night. States last week she began passing "clumps of tissue. It is not like a clot it is actual tissue." Denies any pain or cramping. States she has intermittent cramping about twice week. Patient was seen on 10/4/208 for abnormal cramping with IUD. Results showed UTERUS: 8.7 x 4.7 x 3.2cm, thickened endometrium at the LUS with some vascularity noted, possible mass EMS:4mm with IUD in correct location ADNEXA: Left ovary:  2.5 x 1.7 x 1.9cm with 7 x 5 mm echogenic focus, possible dermoid Right ovary: 2.5 x 1.9 x 1.8cm CUL DE SAC: no free fluid. Patient is scheduled for follow up in November for PUS. Advised patient will review with Dr.Miller and return call. Patient became frustrated when RN mentioned the need for possible earlier follow up.

## 2017-05-30 NOTE — Telephone Encounter (Signed)
Return call to Kaitlyn. °

## 2017-05-30 NOTE — Telephone Encounter (Signed)
Left message to call Tniya Bowditch at [phone]. 

## 2017-05-30 NOTE — Telephone Encounter (Signed)
Spoke with patient. Advised of message as seen below from [place]. Patient verbalizes understanding. PUS scheduled for 05/31/2017 at 4 pm with 4:30 pm consult with Dr.MIller. Patient is agreeable to date and time. Will close encounter.

## 2017-05-30 NOTE — Telephone Encounter (Signed)
Patient is returning a call to [name]. Please call her at [phone]

## 2017-05-31 ENCOUNTER — Ambulatory Visit (INDEPENDENT_AMBULATORY_CARE_PROVIDER_SITE_OTHER): Payer: BLUE CROSS/BLUE SHIELD

## 2017-05-31 ENCOUNTER — Ambulatory Visit (INDEPENDENT_AMBULATORY_CARE_PROVIDER_SITE_OTHER): Payer: BLUE CROSS/BLUE SHIELD | Admitting: Obstetrics & Gynecology

## 2017-05-31 VITALS — BP 138/82 | HR 96 | Resp 16 | Ht 64.0 in | Wt 262.0 lb

## 2017-05-31 DIAGNOSIS — R935 Abnormal findings on diagnostic imaging of other abdominal regions, including retroperitoneum: Secondary | ICD-10-CM | POA: Diagnosis not present

## 2017-05-31 DIAGNOSIS — R9389 Abnormal findings on diagnostic imaging of other specified body structures: Secondary | ICD-10-CM

## 2017-05-31 DIAGNOSIS — N926 Irregular menstruation, unspecified: Secondary | ICD-10-CM

## 2017-05-31 DIAGNOSIS — E282 Polycystic ovarian syndrome: Secondary | ICD-10-CM

## 2017-05-31 DIAGNOSIS — N914 Secondary oligomenorrhea: Secondary | ICD-10-CM

## 2017-05-31 NOTE — Progress Notes (Signed)
31 y.o. G39P2002 Married Caucasian female here for pelvic ultrasound due to continued bleeding since placement of Mirena IUD on 04/30/17.  Pt has long hx of amenorrhea (or at times oligomenorrhea).  Endometrial biopsy was negative 04/30/17.  She's had bleeding, spotting, clotting, passage of tissue basically every day since IUD placement.  Endometrium was thickened with ultrasound in early October.  Patient's last menstrual period was 04/06/2017.  Contraception: IUD  Findings:  UTERUS: 7.7 x 4.3 x 3.5cm EMS: 8.7cm, much thinner in appearnace ADNEXA: Left ovary: 2.2 x 1.4 x 2.0cm with 8 x7cm echogenic focus, seen previously       Right ovary: 2.3 x 1.9 x 1.1cm CUL DE SAC: no free fluid  Discussion:  Findings reviewed with pt.  I really do think that this IUD is important for her to keep as she has significant risk for endometrial hyperplasia or endometrial cancer in the future.  D/w pt we could proceed with hysteroscopy with D&C and then replace the IUD which would really improve bleeding.  However, enodmetrium is much thinner so I feel is she can tolerate this, then it is ok to wait and repeat PUS in November as planned.  Pt would consider hysteroscopy depending on cost.  Will have this precerted for her.  She is comfortable with this plan.  Fingerstick Hb obtained and was 12.5 today  Assessment:  H/O amenorrhea Thickened endometrium Mirena IUD Irregular bleeding since IUD placement, at times menorrhagia   Plan:  Will precert hysteroscopy for pt If she decides to wait, will repeat PUS in about a month  ~20 minutes spent with patient >50% of time was in face to face discussion of above.

## 2017-06-03 ENCOUNTER — Encounter: Payer: Self-pay | Admitting: Obstetrics & Gynecology

## 2017-06-13 ENCOUNTER — Telehealth: Payer: Self-pay | Admitting: Obstetrics & Gynecology

## 2017-06-13 NOTE — Telephone Encounter (Signed)
Call placed to patient to review benefits for surgical procedure. Patient advises she does not desire to proceed at this time. Discussed need for follow up ultrasound, patient understood and is agreeable. Patient is scheduled for ultrasound on 07/05/17 with Dr Sabra Heck. Patient is aware of the appointment date, arrival time and cancellation policy. No further questions.  Routing to Dr Sabra Heck for review

## 2017-07-05 ENCOUNTER — Ambulatory Visit: Payer: BLUE CROSS/BLUE SHIELD | Admitting: Obstetrics & Gynecology

## 2017-07-05 ENCOUNTER — Ambulatory Visit (INDEPENDENT_AMBULATORY_CARE_PROVIDER_SITE_OTHER): Payer: BLUE CROSS/BLUE SHIELD

## 2017-07-05 ENCOUNTER — Other Ambulatory Visit: Payer: Self-pay

## 2017-07-05 ENCOUNTER — Other Ambulatory Visit: Payer: Self-pay | Admitting: *Deleted

## 2017-07-05 ENCOUNTER — Other Ambulatory Visit: Payer: Self-pay | Admitting: Obstetrics & Gynecology

## 2017-07-05 VITALS — BP 128/86 | HR 92 | Resp 18 | Ht 64.0 in | Wt 262.0 lb

## 2017-07-05 DIAGNOSIS — R935 Abnormal findings on diagnostic imaging of other abdominal regions, including retroperitoneum: Secondary | ICD-10-CM

## 2017-07-05 DIAGNOSIS — N926 Irregular menstruation, unspecified: Secondary | ICD-10-CM | POA: Diagnosis not present

## 2017-07-05 DIAGNOSIS — N914 Secondary oligomenorrhea: Secondary | ICD-10-CM

## 2017-07-05 DIAGNOSIS — E282 Polycystic ovarian syndrome: Secondary | ICD-10-CM

## 2017-07-05 DIAGNOSIS — R9389 Abnormal findings on diagnostic imaging of other specified body structures: Secondary | ICD-10-CM

## 2017-07-05 NOTE — Progress Notes (Signed)
31 y.o. G56P2002 Married Caucasian female here for pelvic ultrasound due to irregular endometrium and significant bleeding after IUD was placed.  Bleeding has finally tapered off and is either nothing at all or just a small spot in the middle of a light day pad.  Declines any heavy bleeding or clots.  So happy the heaviness seems to have resolved..  No LMP recorded. Patient is not currently having periods (Reason: IUD).  Contraception: Mirena IUD  Findings:  UTERUS: 8.2 x 4.4 x 3.2cm EMS:4.1 - 5.78mm with IUD in correct location ADNEXA: Left ovary: 3.0 x 2.0 x 1.6cm with 38mm echogenic focus, peripheral follicles noted c/w PCOS       Right ovary: 2.1 x 2.0 x 1.3cm with 43mm echogenic focus.  Multiple small peripheral follicles noted CUL DE SAC: no free fluid  Discussion:  Findings reviewed with pt.  endometrium is now finally thin and symmetric.  Bleeding has also improved.  As a result, fee it is ok to monitor.  She is very satisfied with improvement in bleeding.  Also she is aware of possible bilateral dermoids.  Due to size, nothing is needed at this time.    Assessment:  Irregular endometrium that has now normalized in appearance PCOS with anovulation (prior risks without IUD of endometrial hyperplasia)  Plan:  Will plan AEX and PUS in six months to recheck ovaries, in particular, and to reassess pt's bleeding.  She is aware to call with any significant changes to bleeding.  Questions voiced and answered.  ~15 minutes spent with patient >50% of time was in face to face discussion of above.

## 2017-07-05 NOTE — Progress Notes (Signed)
Patient scheduled while in office for 6 month f/u PUS and AEX on 12/13/17 at 3:30pm with consult at 4pm with Dr. Sabra Heck. Patient verbalizes understanding and is agreeable.

## 2017-07-06 ENCOUNTER — Ambulatory Visit: Payer: BLUE CROSS/BLUE SHIELD | Admitting: Obstetrics & Gynecology

## 2017-07-09 ENCOUNTER — Encounter: Payer: Self-pay | Admitting: Obstetrics & Gynecology

## 2017-10-26 ENCOUNTER — Ambulatory Visit: Payer: BLUE CROSS/BLUE SHIELD | Admitting: Obstetrics & Gynecology

## 2017-12-13 ENCOUNTER — Ambulatory Visit (INDEPENDENT_AMBULATORY_CARE_PROVIDER_SITE_OTHER): Payer: BLUE CROSS/BLUE SHIELD

## 2017-12-13 ENCOUNTER — Encounter: Payer: Self-pay | Admitting: Obstetrics & Gynecology

## 2017-12-13 ENCOUNTER — Other Ambulatory Visit: Payer: Self-pay

## 2017-12-13 ENCOUNTER — Ambulatory Visit: Payer: BLUE CROSS/BLUE SHIELD | Admitting: Obstetrics & Gynecology

## 2017-12-13 VITALS — BP 122/80 | HR 96 | Resp 14 | Ht 64.5 in | Wt 260.0 lb

## 2017-12-13 DIAGNOSIS — R935 Abnormal findings on diagnostic imaging of other abdominal regions, including retroperitoneum: Secondary | ICD-10-CM

## 2017-12-13 DIAGNOSIS — N926 Irregular menstruation, unspecified: Secondary | ICD-10-CM

## 2017-12-13 DIAGNOSIS — Z01419 Encounter for gynecological examination (general) (routine) without abnormal findings: Secondary | ICD-10-CM | POA: Diagnosis not present

## 2017-12-13 DIAGNOSIS — E282 Polycystic ovarian syndrome: Secondary | ICD-10-CM

## 2017-12-13 MED ORDER — FLUOXETINE HCL 20 MG PO CAPS: 20.0000 mg | ORAL_CAPSULE | Freq: Every day | ORAL | 4 refills | Status: DC

## 2017-12-13 NOTE — Progress Notes (Signed)
Results noted in AEX note.

## 2017-12-13 NOTE — Progress Notes (Signed)
Patient scheduled while in office for PUS and AEX on 12/19/18 at 3pm with consult to follow at 3:30pm with Dr. Sabra Heck. Patient verbalizes understanding and is agreeable.   Order placed for PUS for 12/2018

## 2017-12-13 NOTE — Progress Notes (Signed)
32 y.o. [id number] MarriedCaucasianF here for annual exam.  Pt having irregular spotting with IUD that is light.  No heavy bleeding and no clotting.  Was hoping to have no bleeding as she has experienced significant episode of amenorrhea in the past.  Mirena IUD was placed 04/30/17.  Endometrial boipsy was performed same day.  She did have several ultrasound last year due to thickened and at times irregular appearing endometrium.  Ultrasound planned today as well and this was done prior to exam.  Results: Uterus:  7.5 x 5.2 x 3.5cm Endometrium:  Symmetric, 108mm, IUD within correct location Left ovary:  3.0 x 2.0 x 1.5cm with 55mm echogenic focus.  No significnat chang ein size since 07/05/17.  Small peripheral follicles on both ovaries <39mm Right ovary:  1.9 x 1.2 x 1.2cm with 2mm echogenic focus, decreased echogenicity.  No change in size. Cul de sac:  No free fluid  Patient's last menstrual period was 12/06/2017 (approximate).          Sexually active: Yes.    The current method of family planning is IUD.    Exercising: Yes.    kids Smoker:  no  Health Maintenance: Pap:  10/24/16 Neg. HR HPV:neg  History of abnormal Pap:  no MMG:  Never Colonoscopy:  07/11/10 Normal  TDaP:  2012 Screening Labs: will come back fasting    reports that she has never smoked. She has never used smokeless tobacco. She reports that she does not drink alcohol or use drugs.  Past Medical History:  Diagnosis Date  . Anxiety   . Depression   . Infertility, female    clomid  . Migraines   . PCOS (polycystic ovarian syndrome)   . Stomach problems    (migraines or spasms)    Past Surgical History:  Procedure Laterality Date  . COLONOSCOPY  2011  . GALLBLADDER SURGERY    . LAPAROSCOPIC OVARIAN CYSTECTOMY Right 12/24/2013   Procedure: LAPAROSCOPIC OVARIAN CYSTECTOMY;  Surgeon: Azalia Bilis, MD;  Location: [facility];  Service: Gynecology;  Laterality: Right;  . UPPER GI ENDOSCOPY  2011  . WISDOM TOOTH  EXTRACTION      Current Outpatient Medications  Medication Sig Dispense Refill  . FLUoxetine (PROZAC) 20 MG capsule Take 1 capsule (20 mg total) by mouth daily. 90 capsule 4  . ibuprofen (ADVIL,MOTRIN) 600 MG tablet Take 1 tablet (600 mg total) by mouth every 6 (six) hours as needed. 30 tablet 2  . Levonorgestrel (MIRENA, 52 MG, IU) by Intrauterine route.    . Prenatal Multivit-Min-Fe-FA (PRENATAL VITAMINS PO) Prenatal Vitamin  1 PO QD     No current facility-administered medications for this visit.     Family History  Problem Relation Age of Onset  . Thyroid disease Mother   . Hyperlipidemia Father   . Cancer Father        prostate cancer  . Breast cancer Maternal Grandmother   . Cancer Maternal Grandmother        bone cancer  . Diabetes Maternal Grandfather   . Anesthesia problems Neg Hx   . Hypotension Neg Hx   . Malignant hyperthermia Neg Hx   . Pseudochol deficiency Neg Hx     Review of Systems  All other systems reviewed and are negative.   Exam:   BP 122/80 (BP Location: Right Arm, Patient Position: Sitting, Cuff Size: Large)   Pulse 96   Resp 14   Ht 5' 4.5" (1.638 m)   Wt 260 lb (117.9  kg)   LMP 12/06/2017 (Approximate)   BMI 43.94 kg/m    Height: 5' 4.5" (163.8 cm)  Ht Readings from Last 3 Encounters:  12/13/17 5' 4.5" (1.638 m)  07/05/17 5\' 4"  (1.626 m)  05/31/17 5\' 4"  (1.626 m)    General appearance: alert, cooperative and appears stated age Head: Normocephalic, without obvious abnormality, atraumatic Neck: no adenopathy, supple, symmetrical, trachea midline and thyroid normal to inspection and palpation Lungs: clear to auscultation bilaterally Breasts: normal appearance, no masses or tenderness Heart: regular rate and rhythm Abdomen: soft, non-tender; bowel sounds normal; no masses,  no organomegaly Extremities: extremities normal, atraumatic, no cyanosis or edema Skin: Skin color, texture, turgor normal. No rashes or lesions Lymph nodes:  Cervical, supraclavicular, and axillary nodes normal. No abnormal inguinal nodes palpated Neurologic: Grossly normal   Pelvic: External genitalia:  no lesions              Urethra:  normal appearing urethra with no masses, tenderness or lesions              Bartholins and Skenes: normal                 Vagina: normal appearing vagina with normal color and discharge, no lesions              Cervix: no lesions              Pap taken: No. Bimanual Exam:  Uterus:  normal size, contour, position, consistency, mobility, non-tender              Adnexa: normal adnexa and no mass, fullness, tenderness               Rectovaginal: Confirms               Anus:  normal sphincter tone, no lesions  Chaperone was present for exam.  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  She did see Dr. Marko Plume in 7/15 and 24 hr urine for 5HIAA was negative.  No additional scans or routine follow-up was recommended.  P:   Mammogram guidelines reviewed in light of family hx.  Will start with 3D MMG at age 28 pap smear with neg HR HPV obtained 2018.  Not needed today. RF Prozac 20mg  daily.  #90/4RF Blood work done with PCP Repeat PUS with AEX next year  ~In addition to AEX, about 15 minutes spent with patient in face to face discussion of ultrasound and results, recommendations.

## 2018-08-22 ENCOUNTER — Telehealth: Payer: Self-pay | Admitting: Obstetrics & Gynecology

## 2018-08-22 DIAGNOSIS — R102 Pelvic and perineal pain: Secondary | ICD-10-CM

## 2018-08-22 DIAGNOSIS — R935 Abnormal findings on diagnostic imaging of other abdominal regions, including retroperitoneum: Secondary | ICD-10-CM

## 2018-08-22 NOTE — Telephone Encounter (Addendum)
Spoke with patient. Patient reports nausea, intermittent mid lower pelvis cramping 4/10 and fatigue for the last 3 wks. IUD for contraceptive, no menses. Denies vomiting, fever/chills, urinary symptoms, vaginal d/c or odor. Patient states she did have a "cold" a couple of weeks ago. Patient states she has Hx of left ovarian cyst, is scheduled for 1 yr f/u PUS on 12/19/18.   Advised I will review with Dr. Sabra Heck and return call to schedule. Patient is agreeable.

## 2018-08-22 NOTE — Telephone Encounter (Signed)
Patient is having cramping and nausea. Thinks it may be coming from cyst.

## 2018-08-22 NOTE — Telephone Encounter (Signed)
Call reviewed with Dr. Sabra Heck, call returned to patient. PUS scheduled for 08/29/18 at 2pm, consult to follow at 2:30pm with Dr. Sabra Heck. Advised patient should symptoms worsen or new symptoms develop, return call to office. If after hours or weekend, seek care at [facility] or local ER. Patient verbalizes understanding and is agreeable.   Routing to provider for final review. Patient is agreeable to disposition. Will close encounter.  Cc: Lerry Liner

## 2018-08-29 ENCOUNTER — Ambulatory Visit (INDEPENDENT_AMBULATORY_CARE_PROVIDER_SITE_OTHER): Payer: Medicaid Other

## 2018-08-29 ENCOUNTER — Other Ambulatory Visit: Payer: Self-pay

## 2018-08-29 ENCOUNTER — Encounter: Payer: Self-pay | Admitting: Obstetrics & Gynecology

## 2018-08-29 ENCOUNTER — Ambulatory Visit: Payer: Medicaid Other | Admitting: Obstetrics & Gynecology

## 2018-08-29 VITALS — BP 112/80 | HR 80 | Resp 16 | Ht 64.5 in | Wt 263.0 lb

## 2018-08-29 DIAGNOSIS — R102 Pelvic and perineal pain: Secondary | ICD-10-CM

## 2018-08-29 DIAGNOSIS — R935 Abnormal findings on diagnostic imaging of other abdominal regions, including retroperitoneum: Secondary | ICD-10-CM

## 2018-08-29 DIAGNOSIS — D3A098 Benign carcinoid tumors of other sites: Secondary | ICD-10-CM

## 2018-08-29 DIAGNOSIS — E7800 Pure hypercholesterolemia, unspecified: Secondary | ICD-10-CM

## 2018-08-29 DIAGNOSIS — R1031 Right lower quadrant pain: Secondary | ICD-10-CM

## 2018-08-29 DIAGNOSIS — E282 Polycystic ovarian syndrome: Secondary | ICD-10-CM

## 2018-08-29 NOTE — Progress Notes (Signed)
33 y.o. G65P2002 Married White or Caucasian female here for pelvic ultrasound due to RLQ discomfort and "hormonal" symptoms like she had when her dermoid on the right was initially discovered.  She has taken a pregnancy test because she's felt so hormonal.  Denies low back pain or urinary symptoms.    Patient's last menstrual period was 04/07/2018 (approximate).  Has not had any irregular bleeding since that time.  Pt is very happy with this change.  Contraception: Mirena IUD  Findings:  UTERUS: 8.7 x 4.8 x 3.2cm with IUD in correct location EMS:7.77mm ADNEXA: Left ovary: 2.8 x 1.9 x 1.9cm with 74mm lesion c/w dermoid, avascular and stable       Right ovary:  3.5 x 3.0 x 3.4cm with 3.3cm x 2.5cm x 2.7cm with internal calcification, avascular but increased in size since 12/13/17 CUL DE SAC: no free fluid  Discussion:  fidnigns reviewed.  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  (she did initially see Dr. Marko Plume who did a 24 hour urine but this was normal.)  Assessment:  RLQ pain hormonal symptoms enlarging right cystic ovarian lesion  Plan:  Will do a 24 urine for 5 HIAA.  If elevated will absolutely need ot see gyn/oncology Return for FLP, HbA1C, CBC, CMP, and TSH.  Ordered placed.       ~20 minutes spent with patient >50% of time was in face to face discussion of above.

## 2018-09-05 ENCOUNTER — Ambulatory Visit (INDEPENDENT_AMBULATORY_CARE_PROVIDER_SITE_OTHER): Payer: Medicaid Other

## 2018-09-05 ENCOUNTER — Other Ambulatory Visit: Payer: Self-pay | Admitting: Obstetrics & Gynecology

## 2018-09-05 DIAGNOSIS — E7800 Pure hypercholesterolemia, unspecified: Secondary | ICD-10-CM

## 2018-09-05 DIAGNOSIS — E282 Polycystic ovarian syndrome: Secondary | ICD-10-CM

## 2018-09-05 DIAGNOSIS — D3A098 Benign carcinoid tumors of other sites: Secondary | ICD-10-CM

## 2018-09-05 DIAGNOSIS — R1031 Right lower quadrant pain: Secondary | ICD-10-CM | POA: Diagnosis not present

## 2018-09-05 NOTE — Progress Notes (Signed)
Patient here to drop off 24 hour urin.  Urin was collected from [place] 09/04/2018 to 0730 09/05/2018. Patient has no complaints. She was informed that she would be notified with results.  Routed to provider for final review.

## 2018-09-06 LAB — CBC
Hematocrit: 42.2 % (ref 34.0–46.6)
Hemoglobin: 13.6 g/dL (ref 11.1–15.9)
MCH: 27.6 pg (ref 26.6–33.0)
MCHC: 32.2 g/dL (ref 31.5–35.7)
MCV: 86 fL (ref 79–97)
Platelets: 293 10*3/uL (ref 150–450)
RBC: 4.92 x10E6/uL (ref 3.77–5.28)
RDW: 13.1 % (ref 11.7–15.4)
WBC: 12.5 10*3/uL — ABNORMAL HIGH (ref 3.4–10.8)

## 2018-09-06 LAB — LIPID PANEL
CHOL/HDL RATIO: 5.6 ratio — AB (ref 0.0–4.4)
Cholesterol, Total: 214 mg/dL — ABNORMAL HIGH (ref 100–199)
HDL: 38 mg/dL — ABNORMAL LOW (ref >39)
LDL Calculated: 140 mg/dL — ABNORMAL HIGH (ref 0–99)
Triglycerides: 179 mg/dL — ABNORMAL HIGH (ref 0–149)
VLDL Cholesterol Cal: 36 mg/dL (ref 5–40)

## 2018-09-06 LAB — COMPREHENSIVE METABOLIC PANEL
ALT: 16 IU/L (ref 0–32)
AST: 12 IU/L (ref 0–40)
Albumin/Globulin Ratio: 1.4 (ref 1.2–2.2)
Albumin: 4 g/dL (ref 3.8–4.8)
Alkaline Phosphatase: 61 IU/L (ref 39–117)
BUN/Creatinine Ratio: 17 (ref 9–23)
BUN: 11 mg/dL (ref 6–20)
Bilirubin Total: 0.2 mg/dL (ref 0.0–1.2)
CO2: 24 mmol/L (ref 20–29)
Calcium: 9.8 mg/dL (ref 8.7–10.2)
Chloride: 99 mmol/L (ref 96–106)
Creatinine, Ser: 0.64 mg/dL (ref 0.57–1.00)
GFR calc Af Amer: 137 mL/min/{1.73_m2} (ref >59)
GFR calc non Af Amer: 118 mL/min/{1.73_m2} (ref >59)
Globulin, Total: 2.9 g/dL (ref 1.5–4.5)
Glucose: 129 mg/dL — ABNORMAL HIGH (ref 65–99)
Potassium: 4.7 mmol/L (ref 3.5–5.2)
Sodium: 141 mmol/L (ref 134–144)
TOTAL PROTEIN: 6.9 g/dL (ref 6.0–8.5)

## 2018-09-06 LAB — HEMOGLOBIN A1C
ESTIMATED AVERAGE GLUCOSE: 128 mg/dL
Hgb A1c MFr Bld: 6.1 % — ABNORMAL HIGH (ref 4.8–5.6)

## 2018-09-06 LAB — TSH: TSH: 1.01 u[IU]/mL (ref 0.450–4.500)

## 2018-09-08 LAB — 5 HIAA, QUANTITATIVE, URINE, 24 HOUR
5-HIAA, Ur: 1.9 mg/L
5-HIAA,Quant.,24 Hr Urine: 3.9 mg/24 hr (ref 0.0–14.9)

## 2018-09-17 ENCOUNTER — Encounter: Payer: Self-pay | Admitting: Emergency Medicine

## 2018-09-17 ENCOUNTER — Telehealth: Payer: Self-pay | Admitting: Emergency Medicine

## 2018-09-17 DIAGNOSIS — D3A098 Benign carcinoid tumors of other sites: Secondary | ICD-10-CM

## 2018-09-17 DIAGNOSIS — D72829 Elevated white blood cell count, unspecified: Secondary | ICD-10-CM

## 2018-09-17 NOTE — Telephone Encounter (Signed)
Spoke with patient. Message from Dr. Sabra Heck given.   Patient requests clarification.  Asks if she does need gyn onc referral at this time as 24 hour urine was normal. States she had thought since it was normal she would have an ultrasound again in one month.  Patient accepts referral if indicated, but would like to understand change.   Patient requests call back after 12:00.

## 2018-09-17 NOTE — Telephone Encounter (Signed)
-----   Message from Megan Salon, MD sent at 09/12/2018 11:13 PM EST ----- Please let pt know her hba1c was 6.1  She has pre-diabetes.  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  She does need to see her PCP about this and discuss treatment.  Her thyroid is norma.  Her cholesterol is elevated at 214 and her LDLs are elevated at 140.  Her triglycerides are also elevated at 179.  This should be reviewed with PCP as well.  CMP was normal except for glucose at 129.  This is why HbA1C was elevated.  CBC was normal except WBC was mildly elevated at 12.5.  This has been chronically elevated for several years.  I would like her to see hematology for additional recommendations.  Lastly, her 24 urine test was normal for serotonin levels.  I'm also routing this result to you.  As these are normal, I think she should see gyn/oncology for consultation and possible ovary removal.

## 2018-09-18 NOTE — Telephone Encounter (Signed)
Spoke with patient and she is given message from Dr. Sabra Heck.  She accepts referrals. They are entered and patient advised to call back if she has any further questions.  Encounter closed.

## 2018-09-18 NOTE — Telephone Encounter (Signed)
Her 24 urine was normal but the ovary has increased in size.  This is the side where the foci of carcinoid was noted so if it is ever removed, I would like it to be with gyn/oncology.  It would be useful to have gyn/onc input about an endpoint (when the ovary should be removed).  If she is willing, I would like a consultation please.  Thanks.

## 2018-09-20 ENCOUNTER — Encounter: Payer: Self-pay | Admitting: Oncology

## 2018-09-23 ENCOUNTER — Telehealth: Payer: Self-pay | Admitting: *Deleted

## 2018-09-23 NOTE — Telephone Encounter (Signed)
Called and spoke with the patient regarding a referral. Scheduled an appt for Thursday at 10:45am. Gave the patient the information for free valet service and pelvic exam.

## 2018-09-26 ENCOUNTER — Inpatient Hospital Stay: Payer: Medicaid Other | Attending: Gynecologic Oncology | Admitting: Gynecologic Oncology

## 2018-09-26 ENCOUNTER — Encounter: Payer: Self-pay | Admitting: Gynecologic Oncology

## 2018-09-26 ENCOUNTER — Other Ambulatory Visit: Payer: Self-pay

## 2018-09-26 VITALS — BP 130/87 | HR 88 | Temp 98.3°F | Resp 18 | Ht 64.0 in | Wt 263.0 lb

## 2018-09-26 DIAGNOSIS — Z9079 Acquired absence of other genital organ(s): Secondary | ICD-10-CM | POA: Insufficient documentation

## 2018-09-26 DIAGNOSIS — Z6841 Body Mass Index (BMI) 40.0 and over, adult: Secondary | ICD-10-CM | POA: Insufficient documentation

## 2018-09-26 DIAGNOSIS — N83201 Unspecified ovarian cyst, right side: Secondary | ICD-10-CM | POA: Insufficient documentation

## 2018-09-26 DIAGNOSIS — D3A098 Benign carcinoid tumors of other sites: Secondary | ICD-10-CM | POA: Diagnosis not present

## 2018-09-26 DIAGNOSIS — E282 Polycystic ovarian syndrome: Secondary | ICD-10-CM | POA: Diagnosis not present

## 2018-09-26 NOTE — Progress Notes (Signed)
Please call pt.  Dr. Denman George communicated with me and has recommended a repeat PUS in 3 months.  She would like [name] to have ultrasound here.  Thanks.

## 2018-09-26 NOTE — Patient Instructions (Signed)
Dr Serita Grit office number is [phone] [address].  Her recommendations are to either:   1/ proceed with removing the right tube and ovary laparoscopically to see if this improves symptoms.  2/ repeat the ultrasound with Dr Sabra Heck in 3 months to monitor for rapid change in size.  Dr Denman George has a low suspicion that this mass is a recurrent carcinoid tumor or a cancer.

## 2018-09-26 NOTE — Progress Notes (Signed)
Consult Note: Gyn-Onc  Consult was requested by Dr. Sabra Heck for the evaluation of [name] 33 y.o. female  CC:  Chief Complaint  Patient presents with  . right ovarian cyst    history of carcinoid tumor of ovary    Assessment/Plan:  [name]  is a 33 y.o.  year old with a history of a right ovarian carcinoid tumor and a new right ovarian cyst. She has some symptoms of right sided pelvic pain and "pregnancy like" symptoms.  It is unclear that these are related to the right ovarian cyst. I suspect not.   I offered her two options to proceed:  1/serial ultrasounds with the next in 3 months and if this is stable, repeated in 6 months and then annually at Dr. Ammie Ferrier discretion. 2/ proceeding with right salpingo-oophorectomy now.  This would result in resolution regarding concern for follow-up of the carcinoid tumor, however it would remove her right ovary leaving behind only a left ovary that we know contains a dermoid cyst.  Therefore she is at increased risk for losing the left ovary at some point in her reproductive life.  This was resulted in early surgical menopause and sterility.  After considering her options she is electing for close observation with Dr. Sabra Heck.  We will notify Dr. Sabra Heck to let her know that she is returning for an [place] in 3 months. If the cyst continues to grow in that time, it would be reasonable to proceed with surgery.   HPI: [name] is a 33 year old obese (BMI 75) P2, who is seen in consultation at the request of Dr Sabra Heck for a right ovarian cyst in the setting of a history of a right carcinoid tumor of the ovary.   The patient's tumor history began in 2015 when she underwent a laparoscopic right ovarian cystectomy.  Final pathology revealed a dermoid cyst with a 2.5 mm focus of carcinoid tumor.  Prior to that ovarian cystectomy she had been experiencing pregnancy-like symptoms.  She was seen by Dr. Marko Plume postoperatively who ordered  a 24-hour urine5HIAA which was normal.  She was recommended to undergo close surveillance with high anticipation that the carcinoid had been completely resected.  Over the subsequent years she is undergone multiple serial ultrasounds with Dr. Sabra Heck due to her diagnosis of PCOS, the history of the prior carcinoid tumor, a new finding of a left ovarian dermoid cyst, and a history of abnormal uterine bleeding with thickened endometrium.  She is a Mirena IUD in place which is controlling her bleeding symptoms.  Her most recent ultrasound was on August 29, 2018.  The preceding ultrasound had been in August 2019.  It demonstrated a uterus measuring 8.7 x 4.8 x 3.2 cm with an IUD in situ.  The endometrium was 7 mm.  The left ovary was 2.8 x 1.9 x 1.9 cm with a 9 mm avascular dermoid cyst that was stable in size.  The right ovary measured 3.5 x 3 x 3.4 cm in total, with a 3.3 x 2.5 x 2.7 cm new cystic lesion with internal calcification, avascular but increased in size from May 2019.  It appeared most consistent with a dermoid.  She underwent screening urine 24-hour 5HIAA which was normal at 3.9.  The patient has a history of 2 prior vaginal deliveries, she required assistance with Clomid therapy in order to conceive.  She carries a diagnosis of PCOS.  She is morbidly obese with a BMI of 45  kg/m.  Her prior surgeries include a laparoscopic cholecystectomy and a laparoscopic right ovarian cystectomy.  Current Meds:  Outpatient Encounter Medications as of 09/26/2018  Medication Sig  . FLUoxetine (PROZAC) 20 MG capsule Take 1 capsule (20 mg total) by mouth daily.  . Levonorgestrel (MIRENA, 52 MG, IU) by Intrauterine route.  . meloxicam (MOBIC) 15 MG tablet Take by mouth daily as needed.  . Multiple Vitamins-Minerals (WOMENS MULTIVITAMIN) TABS daily.  Marland Kitchen ibuprofen (ADVIL,MOTRIN) 600 MG tablet Take 1 tablet (600 mg total) by mouth every 6 (six) hours as needed. (Patient not taking: Reported on 09/26/2018)  .  [DISCONTINUED] cephALEXin (KEFLEX) 500 MG capsule Take 1 capsule by mouth 2 (two) times daily.   No facility-administered encounter medications on file as of 09/26/2018.     Allergy:  Allergies  Allergen Reactions  . Augmentin [Amoxicillin-Pot Clavulanate] Swelling    Facial swelling  . Penicillins Other (See Comments)    Reaction:  Unknown     Social Hx:   Social History   Socioeconomic History  . Marital status: Married    Spouse name: Not on file  . Number of children: Not on file  . Years of education: Not on file  . Highest education level: Not on file  Occupational History  . Not on file  Social Needs  . Financial resource strain: Not on file  . Food insecurity:    Worry: Not on file    Inability: Not on file  . Transportation needs:    Medical: Not on file    Non-medical: Not on file  Tobacco Use  . Smoking status: Never Smoker  . Smokeless tobacco: Never Used  Substance and Sexual Activity  . Alcohol use: No  . Drug use: No  . Sexual activity: Yes    Partners: Male    Birth control/protection: I.U.D.    Comment: Mirena inserted 04/30/17  Lifestyle  . Physical activity:    Days per week: Not on file    Minutes per session: Not on file  . Stress: Not on file  Relationships  . Social connections:    Talks on phone: Not on file    Gets together: Not on file    Attends religious service: Not on file    Active member of club or organization: Not on file    Attends meetings of clubs or organizations: Not on file    Relationship status: Not on file  . Intimate partner violence:    Fear of current or ex partner: Not on file    Emotionally abused: Not on file    Physically abused: Not on file    Forced sexual activity: Not on file  Other Topics Concern  . Not on file  Social History Narrative  . Not on file    Past Surgical Hx:  Past Surgical History:  Procedure Laterality Date  . COLONOSCOPY  2011  . GALLBLADDER SURGERY    . LAPAROSCOPIC OVARIAN  CYSTECTOMY Right 12/24/2013   Procedure: LAPAROSCOPIC OVARIAN CYSTECTOMY;  Surgeon: Azalia Bilis, MD;  Location: [facility];  Service: Gynecology;  Laterality: Right;  . UPPER GI ENDOSCOPY  2011  . WISDOM TOOTH EXTRACTION      Past Medical Hx:  Past Medical History:  Diagnosis Date  . Anxiety   . Depression   . Infertility, female    clomid  . Migraines   . PCOS (polycystic ovarian syndrome)   . Stomach problems    (migraines or spasms)    Past  Gynecological History:  See HPI No LMP recorded. (Menstrual status: IUD).  Family Hx:  Family History  Problem Relation Age of Onset  . Thyroid disease Mother   . Hyperlipidemia Father   . Cancer Father        prostate cancer  . Breast cancer Maternal Grandmother   . Cancer Maternal Grandmother        bone cancer  . Diabetes Maternal Grandfather   . Anesthesia problems Neg Hx   . Hypotension Neg Hx   . Malignant hyperthermia Neg Hx   . Pseudochol deficiency Neg Hx     Review of Systems:  Constitutional  Feels well,    ENT Normal appearing ears and nares bilaterally Skin/Breast  No rash, sores, jaundice, itching, dryness Cardiovascular  No chest pain, shortness of breath, or edema  Pulmonary  No cough or wheeze.  Gastro Intestinal  No nausea, vomitting, or diarrhoea. No bright red blood per rectum, no abdominal pain, change in bowel movement, or constipation.  Genito Urinary  No frequency, urgency, dysuria, + right pelvic pain Musculo Skeletal  No myalgia, arthralgia, joint swelling or pain  Neurologic  No weakness, numbness, change in gait,  Psychology  No depression, anxiety, insomnia.   Vitals:  Blood pressure 130/87, pulse 88, temperature 98.3 F (36.8 C), temperature source Oral, resp. rate 18, height 5\' 4"  (1.626 m), weight 263 lb (119.3 kg), SpO2 99 %, currently breastfeeding.  Physical Exam: WD in NAD Neck  Supple NROM, without any enlargements.  Lymph Node Survey No cervical supraclavicular or inguinal  adenopathy Cardiovascular  Pulse normal rate, regularity and rhythm. S1 and S2 normal.  Lungs  Clear to auscultation bilateraly, without wheezes/crackles/rhonchi. Good air movement.  Skin  No rash/lesions/breakdown  Psychiatry  Alert and oriented to person, place, and time  Abdomen  Normoactive bowel sounds, abdomen soft, non-tender and obese without evidence of hernia.  Back No CVA tenderness Genito Urinary  Vulva/vagina: Normal external female genitalia.  No lesions. No discharge or bleeding.  Bladder/urethra:  No lesions or masses, well supported bladder  Vagina: normal  Cervix: Normal appearing, no lesions.  Uterus:  Small, mobile, no parametrial involvement or nodularity.  Adnexa: no discretely palpable masses. Rectal  deferred Extremities  No bilateral cyanosis, clubbing or edema.   Melinda Solo, MD  09/26/2018, 11:12 AM

## 2018-09-27 ENCOUNTER — Telehealth: Payer: Self-pay | Admitting: Emergency Medicine

## 2018-09-27 NOTE — Telephone Encounter (Signed)
Dr. Sabra Heck,  Patient has appointment 12/19/2018 for Pelvic ultrasound  And Annual exam. She would like to keep this as scheduled.  Okay to keep as scheduled for 3 month follow up with ultrasound and annual exam?

## 2018-09-27 NOTE — Telephone Encounter (Signed)
Yes, ok to keep appointments as scheduled.

## 2018-10-07 ENCOUNTER — Telehealth: Payer: Self-pay | Admitting: Internal Medicine

## 2018-10-07 NOTE — Telephone Encounter (Signed)
Pt has been rescheduled to see Dr. Walden Field on 3/12 at 1pm. She agreed to the new appt date and time.

## 2018-10-15 ENCOUNTER — Encounter: Payer: Self-pay | Admitting: Oncology

## 2018-10-17 ENCOUNTER — Inpatient Hospital Stay: Payer: Medicaid Other

## 2018-10-17 ENCOUNTER — Telehealth: Payer: Self-pay | Admitting: Internal Medicine

## 2018-10-17 ENCOUNTER — Other Ambulatory Visit: Payer: Self-pay

## 2018-10-17 ENCOUNTER — Inpatient Hospital Stay: Payer: Medicaid Other | Attending: Gynecologic Oncology | Admitting: Internal Medicine

## 2018-10-17 VITALS — BP 126/90 | HR 79 | Temp 98.7°F | Resp 18 | Ht 64.0 in | Wt 259.5 lb

## 2018-10-17 DIAGNOSIS — E669 Obesity, unspecified: Secondary | ICD-10-CM | POA: Insufficient documentation

## 2018-10-17 DIAGNOSIS — D72828 Other elevated white blood cell count: Secondary | ICD-10-CM

## 2018-10-17 DIAGNOSIS — E282 Polycystic ovarian syndrome: Secondary | ICD-10-CM | POA: Diagnosis not present

## 2018-10-17 DIAGNOSIS — Z791 Long term (current) use of non-steroidal anti-inflammatories (NSAID): Secondary | ICD-10-CM | POA: Diagnosis not present

## 2018-10-17 DIAGNOSIS — D72829 Elevated white blood cell count, unspecified: Secondary | ICD-10-CM

## 2018-10-17 DIAGNOSIS — Z803 Family history of malignant neoplasm of breast: Secondary | ICD-10-CM | POA: Diagnosis not present

## 2018-10-17 DIAGNOSIS — Z79899 Other long term (current) drug therapy: Secondary | ICD-10-CM | POA: Diagnosis not present

## 2018-10-17 DIAGNOSIS — Z8042 Family history of malignant neoplasm of prostate: Secondary | ICD-10-CM

## 2018-10-17 DIAGNOSIS — R700 Elevated erythrocyte sedimentation rate: Secondary | ICD-10-CM | POA: Diagnosis not present

## 2018-10-17 DIAGNOSIS — M25559 Pain in unspecified hip: Secondary | ICD-10-CM

## 2018-10-17 DIAGNOSIS — Z808 Family history of malignant neoplasm of other organs or systems: Secondary | ICD-10-CM

## 2018-10-17 DIAGNOSIS — M722 Plantar fascial fibromatosis: Secondary | ICD-10-CM | POA: Insufficient documentation

## 2018-10-17 DIAGNOSIS — F329 Major depressive disorder, single episode, unspecified: Secondary | ICD-10-CM | POA: Diagnosis not present

## 2018-10-17 LAB — CMP (CANCER CENTER ONLY)
ALT: 19 U/L (ref 0–44)
AST: 13 U/L — ABNORMAL LOW (ref 15–41)
Albumin: 4 g/dL (ref 3.5–5.0)
Alkaline Phosphatase: 58 U/L (ref 38–126)
Anion gap: 10 (ref 5–15)
BUN: 13 mg/dL (ref 6–20)
CO2: 28 mmol/L (ref 22–32)
Calcium: 10.5 mg/dL — ABNORMAL HIGH (ref 8.9–10.3)
Chloride: 102 mmol/L (ref 98–111)
Creatinine: 0.76 mg/dL (ref 0.44–1.00)
GFR, Est AFR Am: >60 mL/min (ref >60)
GFR, Estimated: >60 mL/min (ref >60)
Glucose, Bld: 87 mg/dL (ref 70–99)
Potassium: 4.2 mmol/L (ref 3.5–5.1)
Sodium: 140 mmol/L (ref 135–145)
Total Bilirubin: 0.5 mg/dL (ref 0.3–1.2)
Total Protein: 7.9 g/dL (ref 6.5–8.1)

## 2018-10-17 LAB — CBC WITH DIFFERENTIAL (CANCER CENTER ONLY)
ABS IMMATURE GRANULOCYTES: 0.03 10*3/uL (ref 0.00–0.07)
Basophils Absolute: 0 10*3/uL (ref 0.0–0.1)
Basophils Relative: 0 %
Eosinophils Absolute: 0.1 10*3/uL (ref 0.0–0.5)
Eosinophils Relative: 1 %
HCT: 43.2 % (ref 36.0–46.0)
Hemoglobin: 13.6 g/dL (ref 12.0–15.0)
Immature Granulocytes: 0 %
Lymphocytes Relative: 33 %
Lymphs Abs: 3.9 10*3/uL (ref 0.7–4.0)
MCH: 27 pg (ref 26.0–34.0)
MCHC: 31.5 g/dL (ref 30.0–36.0)
MCV: 85.7 fL (ref 80.0–100.0)
MONO ABS: 0.6 10*3/uL (ref 0.1–1.0)
Monocytes Relative: 5 %
NEUTROS ABS: 7.3 10*3/uL (ref 1.7–7.7)
Neutrophils Relative %: 61 %
Platelet Count: 286 10*3/uL (ref 150–400)
RBC: 5.04 MIL/uL (ref 3.87–5.11)
RDW: 13.1 % (ref 11.5–15.5)
WBC Count: 12 10*3/uL — ABNORMAL HIGH (ref 4.0–10.5)
nRBC: 0 % (ref 0.0–0.2)

## 2018-10-17 LAB — SEDIMENTATION RATE: SED RATE: 26 mm/h — AB (ref 0–22)

## 2018-10-17 LAB — LACTATE DEHYDROGENASE: LDH: 153 U/L (ref 98–192)

## 2018-10-17 LAB — FERRITIN: Ferritin: 166 ng/mL (ref 11–307)

## 2018-10-17 NOTE — Telephone Encounter (Signed)
Scheduled appt per 3/12 los. ° °Printed calendar and avs. ° °

## 2018-10-17 NOTE — Progress Notes (Signed)
Referring Physician:  Dr. Sabra Heck  Diagnosis Other elevated white blood cell (WBC) count - Plan: CBC with Differential ([facility] Only), CMP ([facility] only), Lactate dehydrogenase (LDH), Sedimentation rate, Ferritin, SPEP (Serum protein electrophoresis), Rheumatoid factor, BCR ABL1 FISH ([facility]), CANCELED: JAK2 V617F, w Reflex to CALR/E12/MPL, CANCELED: BCR-ABL1 FISH  Pain in joint involving pelvic region and thigh, unspecified laterality - Plan: CBC with Differential ([facility] Only), CMP ([facility] only), Lactate dehydrogenase (LDH), Sedimentation rate, Ferritin, SPEP (Serum protein electrophoresis), Rheumatoid factor, CANCELED: JAK2 V617F, w Reflex to CALR/E12/MPL, CANCELED: BCR-ABL1 FISH  Staging Cancer Staging No matching staging information was found for the patient.  Assessment and Plan:  1. Leukocytosis.   33 year old female referred for evaluation due to leukocytosis.  She denies any fever, chills, night sweats, weight loss, she has noted no adenopathy.  She had lab work done on January 30 of 2020 that showed a white count of 12.5 hemoglobin 13.6 platelets 293,000.  Chemistries were within normal limits with a potassium of 4.7 creatinine 0.64 normal liver function tests.  She reports joint symptoms and PCOS.  She is currently on meloxicam.  She reports after delivery her white count was elevated.  She denies any recent prednisone use.  She denies any family history of leukemias lymphomas.  Patient is seen today for consultation due to leukocytosis.  Labs done today 10/17/2018 reviewed and showed WBC 12 HB 13.6 plts 286,000.  Chemistries WNL with K+ 4.2 Cr 0.76 LDH 153.  Awaiting results of Sed rate, CRP, BCR/ABL and Jak 2.  Suspect normal variant.  Pt will RTC in 2 weeks to go over results.    2.  Joint pain.  Pt reports plantar fasciitis.  Awaiting results of SPEP, Sed rate, CRP, RF.  Pt reports she is on Mobic.    3.  PCOS.  Follow-up with GYN as directed.    30 minutes  spent with more than 50% spent in review of records, counseling and coordination of care.    HPI: 33 year old female referred for evaluation due to leukocytosis.  She denies any fever, chills, night sweats, weight loss, she has noted no adenopathy.  She had lab work done on September 05, 2018 that showed a white count of 12.5 hemoglobin 13.6 platelets 293,000.  Chemistries were within normal limits with a potassium of 4.7 creatinine 0.64 normal liver function tests.  She reports joint symptoms and PCOS.  She is currently on meloxicam.  She reports after delivery her white count was elevated.  She denies any recent prednisone use.  She denies any family history of leukemias lymphomas.  Patient is seen today for consultation due to leukocytosis.  Problem List Patient Active Problem List   Diagnosis Date Noted  . Right ovarian cyst [N83.201] 09/26/2018  . Anxiety [F41.9] 03/06/2015  . Female infertility [N97.9] 03/06/2015  . Stomach spasm [K31.89] 03/06/2015  . Obesity, Class III, BMI 40-49.9 (morbid obesity) ([place]) [E66.01] 02/11/2014  . Carcinoid tumor of ovary [D3A.098] 01/07/2014  . 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 Amenorrhea [N91.2] 11/19/2013  . PCOS (polycystic ovarian syndrome) [E28.2] 09/30/2013  . Migraines [G43.909]   . Depression [F32.9]     Past Medical History Past Medical History:  Diagnosis Date  . Anxiety   . Depression   . Infertility, female    clomid  . Migraines   . PCOS (polycystic ovarian syndrome)   . Stomach problems    (migraines or spasms)    Past Surgical History Past Surgical History:  Procedure  Laterality Date  . COLONOSCOPY  2011  . GALLBLADDER SURGERY    . LAPAROSCOPIC OVARIAN CYSTECTOMY Right 12/24/2013   Procedure: LAPAROSCOPIC OVARIAN CYSTECTOMY;  Surgeon: Azalia Bilis, MD;  Location: [facility];  Service: Gynecology;  Laterality: Right;  . UPPER GI ENDOSCOPY  2011  . WISDOM TOOTH EXTRACTION      Family History Family History  Problem  Relation Age of Onset  . Thyroid disease Mother   . Hyperlipidemia Father   . Cancer Father        prostate cancer  . Breast cancer Maternal Grandmother   . Cancer Maternal Grandmother        bone cancer  . Diabetes Maternal Grandfather   . Anesthesia problems Neg Hx   . Hypotension Neg Hx   . Malignant hyperthermia Neg Hx   . Pseudochol deficiency Neg Hx      Social History  reports that she has never smoked. She has never used smokeless tobacco. She reports that she does not drink alcohol or use drugs.  Medications  Current Outpatient Medications:  .  FLUoxetine (PROZAC) 20 MG capsule, Take 1 capsule (20 mg total) by mouth daily., Disp: 90 capsule, Rfl: 4 .  ibuprofen (ADVIL,MOTRIN) 600 MG tablet, Take 1 tablet (600 mg total) by mouth every 6 (six) hours as needed. (Patient not taking: Reported on 09/26/2018), Disp: 30 tablet, Rfl: 2 .  Levonorgestrel (MIRENA, 52 MG, IU), by Intrauterine route., Disp: , Rfl:  .  meloxicam (MOBIC) 15 MG tablet, Take by mouth daily as needed., Disp: , Rfl:  .  Multiple Vitamins-Minerals (WOMENS MULTIVITAMIN) TABS, daily., Disp: , Rfl:   Allergies Augmentin [amoxicillin-pot clavulanate] and Penicillins  Review of Systems Review of Systems - Oncology ROS negative   Physical Exam  Vitals Wt Readings from Last 3 Encounters:  10/17/18 259 lb 8 oz (117.7 kg)  09/26/18 263 lb (119.3 kg)  09/05/18 264 lb (119.7 kg)   Temp Readings from Last 3 Encounters:  10/17/18 98.7 F (37.1 C) (Oral)  09/26/18 98.3 F (36.8 C) (Oral)  06/01/15 99 F (37.2 C) (Oral)   BP Readings from Last 3 Encounters:  10/17/18 126/90  09/26/18 130/87  09/05/18 116/70   Pulse Readings from Last 3 Encounters:  10/17/18 79  09/26/18 88  09/05/18 88   Constitutional: Well-developed, well-nourished, and in no distress.   HENT: Head: Normocephalic and atraumatic.  Mouth/Throat: No oropharyngeal exudate. Mucosa moist. Eyes: Pupils are equal, round, and reactive  to light. Conjunctivae are normal. No scleral icterus.  Neck: Normal range of motion. Neck supple. No JVD present.  Cardiovascular: Normal rate, regular rhythm and normal heart sounds.  Exam reveals no gallop and no friction rub.   No murmur heard. Pulmonary/Chest: Effort normal and breath sounds normal. No respiratory distress. No wheezes.No rales.  Abdominal: Soft. Bowel sounds are normal. No distension. There is no tenderness. There is no guarding.  Musculoskeletal: No edema or tenderness.  Lymphadenopathy: No cervical,axillary or supraclavicular adenopathy.  Neurological: Alert and oriented to person, place, and time. No cranial nerve deficit.  Skin: Skin is warm and dry. No rash noted. No erythema. No pallor.  Psychiatric: Affect and judgment normal.   Labs Appointment on 10/17/2018  Component Date Value Ref Range Status  . Sodium 10/17/2018 140  135 - 145 mmol/L Final  . Potassium 10/17/2018 4.2  3.5 - 5.1 mmol/L Final  . Chloride 10/17/2018 102  98 - 111 mmol/L Final  . CO2 10/17/2018 28  22 - 32  mmol/L Final  . Glucose, Bld 10/17/2018 87  70 - 99 mg/dL Final  . BUN 10/17/2018 13  6 - 20 mg/dL Final  . Creatinine 10/17/2018 0.76  0.44 - 1.00 mg/dL Final  . Calcium 10/17/2018 10.5* 8.9 - 10.3 mg/dL Final  . Total Protein 10/17/2018 7.9  6.5 - 8.1 g/dL Final  . Albumin 10/17/2018 4.0  3.5 - 5.0 g/dL Final  . AST 10/17/2018 13* 15 - 41 U/L Final  . ALT 10/17/2018 19  0 - 44 U/L Final  . Alkaline Phosphatase 10/17/2018 58  38 - 126 U/L Final  . Total Bilirubin 10/17/2018 0.5  0.3 - 1.2 mg/dL Final  . GFR, Est Non Af Am 10/17/2018 >60  >60 mL/min Final  . GFR, Est AFR Am 10/17/2018 >60  >60 mL/min Final  . Anion gap 10/17/2018 10  5 - 15 Final   Performed at [facility], [place] [address] [place], [place] [address]  . WBC Count 10/17/2018 12.0* 4.0 - 10.5 K/uL Final  . RBC 10/17/2018 5.04  3.87 - 5.11 MIL/uL Final  . Hemoglobin 10/17/2018 13.6  12.0 - 15.0  g/dL Final  . HCT 10/17/2018 43.2  36.0 - 46.0 % Final  . MCV 10/17/2018 85.7  80.0 - 100.0 fL Final  . MCH 10/17/2018 27.0  26.0 - 34.0 pg Final  . MCHC 10/17/2018 31.5  30.0 - 36.0 g/dL Final  . RDW 10/17/2018 13.1  11.5 - 15.5 % Final  . Platelet Count 10/17/2018 286  150 - 400 K/uL Final  . nRBC 10/17/2018 0.0  0.0 - 0.2 % Final  . Neutrophils Relative % 10/17/2018 61  % Final  . Neutro Abs 10/17/2018 7.3  1.7 - 7.7 K/uL Final  . Lymphocytes Relative 10/17/2018 33  % Final  . Lymphs Abs 10/17/2018 3.9  0.7 - 4.0 K/uL Final  . Monocytes Relative 10/17/2018 5  % Final  . Monocytes Absolute 10/17/2018 0.6  0.1 - 1.0 K/uL Final  . Eosinophils Relative 10/17/2018 1  % Final  . Eosinophils Absolute 10/17/2018 0.1  0.0 - 0.5 K/uL Final  . Basophils Relative 10/17/2018 0  % Final  . Basophils Absolute 10/17/2018 0.0  0.0 - 0.1 K/uL Final  . Immature Granulocytes 10/17/2018 0  % Final  . Abs Immature Granulocytes 10/17/2018 0.03  0.00 - 0.07 K/uL Final   Performed at [facility], [place] [address], [place], [place] [address]     Pathology Orders Placed This Encounter  Procedures  . CBC with Differential ([facility] Only)    Standing Status:   Future    Number of Occurrences:   1    Standing Expiration Date:   10/17/2019  . CMP ([facility] only)    Standing Status:   Future    Number of Occurrences:   1    Standing Expiration Date:   10/17/2019  . Lactate dehydrogenase (LDH)    Standing Status:   Future    Number of Occurrences:   1    Standing Expiration Date:   10/17/2019  . Sedimentation rate    Standing Status:   Future    Number of Occurrences:   1    Standing Expiration Date:   10/17/2019  . Ferritin    Standing Status:   Future    Number of Occurrences:   1    Standing Expiration Date:   10/17/2019  . SPEP (Serum protein electrophoresis)    Standing Status:  Future    Number of Occurrences:   1    Standing Expiration Date:   10/17/2019  .  Rheumatoid factor    Standing Status:   Future    Number of Occurrences:   1    Standing Expiration Date:   10/17/2019  . BCR ABL1 FISH ([facility])    Standing Status:   Future    Number of Occurrences:   1    Standing Expiration Date:   10/17/2019       Zoila Shutter MD

## 2018-10-18 ENCOUNTER — Encounter: Payer: Self-pay | Admitting: Internal Medicine

## 2018-10-18 ENCOUNTER — Encounter: Payer: Self-pay | Admitting: Oncology

## 2018-10-18 LAB — RHEUMATOID FACTOR

## 2018-10-18 LAB — PROTEIN ELECTROPHORESIS, SERUM
A/G Ratio: 1.1 (ref 0.7–1.7)
Albumin ELP: 3.7 g/dL (ref 2.9–4.4)
Alpha-1-Globulin: 0.2 g/dL (ref 0.0–0.4)
Alpha-2-Globulin: 0.9 g/dL (ref 0.4–1.0)
Beta Globulin: 1.2 g/dL (ref 0.7–1.3)
Gamma Globulin: 1.1 g/dL (ref 0.4–1.8)
Globulin, Total: 3.5 g/dL (ref 2.2–3.9)
Total Protein ELP: 7.2 g/dL (ref 6.0–8.5)

## 2018-10-23 ENCOUNTER — Encounter: Payer: Self-pay | Admitting: Obstetrics & Gynecology

## 2018-10-24 ENCOUNTER — Telehealth: Payer: Self-pay | Admitting: Obstetrics & Gynecology

## 2018-10-24 ENCOUNTER — Other Ambulatory Visit: Payer: Self-pay

## 2018-10-24 ENCOUNTER — Ambulatory Visit: Payer: Medicaid Other | Admitting: Obstetrics & Gynecology

## 2018-10-24 ENCOUNTER — Encounter: Payer: Self-pay | Admitting: Obstetrics & Gynecology

## 2018-10-24 VITALS — BP 120/82 | HR 92 | Resp 16 | Ht 64.0 in | Wt 262.7 lb

## 2018-10-24 DIAGNOSIS — N926 Irregular menstruation, unspecified: Secondary | ICD-10-CM

## 2018-10-24 DIAGNOSIS — N898 Other specified noninflammatory disorders of vagina: Secondary | ICD-10-CM | POA: Diagnosis not present

## 2018-10-24 DIAGNOSIS — N9089 Other specified noninflammatory disorders of vulva and perineum: Secondary | ICD-10-CM

## 2018-10-24 LAB — POCT URINE PREGNANCY: Preg Test, Ur: NEGATIVE

## 2018-10-24 NOTE — Telephone Encounter (Signed)
Patient sent the following correspondence through [facility]. Routing to triage to assist patient with request.  Hi Dr Sabra Heck. I had my hematology apt last week and when they were asking their slew of questions they suggested I reach out with some of my symptoms lately and have them in my chart with you at least. So about 3 weeks ago I had what I assume is blood (Pink tinge discharge on toilet paper) off and on for about a week and a half. After about 8 or so months of the IUD being in I stopped bleeding completely so this was a weird surprise. I have also had alot of discharge over the last 2 weeks it seems. All clear from what I can tell. The last 3 times I had my blood pressure taken it was elevated for me. And I seem to be flushed in the face and arms on occasion. Maybe 3 times in the last 3 weeks or so, including today. So all of this may not be related at all but I thought I would have it on written out for my records as the hematology nurse suggested I do with you. Hope you are staying will in all this craziness!

## 2018-10-24 NOTE — Progress Notes (Signed)
GYNECOLOGY  VISIT  CC:   Vaginal discharge   HPI: 33 y.o. G3P2002 Married White or Caucasian female here for complaint of vaginal discharge that started after she had what seemed like a very light cycle during the last week of February.  The pink bleeding has stopped but she's had some increased watery discharge since that time.   Reports she also has some vulvar bumps that she wants to assess today.  This seem new and she's just noted in the past day or two.    Saw hematologist last week.  Had blood work done and has appt for follow up to discuss lab work next Wednesday.    Denies urinary symptoms.  Having some increased flushing.  She did take her temperature yesterday but this was normal.  GYNECOLOGIC HISTORY: Patient's last menstrual period was 09/29/2018 (approximate). Contraception: Mirena IUD placed 04/30/17  Menopausal hormone therapy: none   Patient Active Problem List   Diagnosis Date Noted  . Right ovarian cyst 09/26/2018  . Anxiety 03/06/2015  . 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 Stomach spasm 03/06/2015  . Obesity, Class III, BMI 40-49.9 (morbid obesity) ([place]) 02/11/2014  . 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 Amenorrhea 11/19/2013  . PCOS (polycystic ovarian syndrome) 09/30/2013  . Migraines   . Depression     Past Medical History:  Diagnosis Date  . Anxiety   . Depression   . Infertility, female    clomid  . Migraines   . PCOS (polycystic ovarian syndrome)   . Stomach problems    (migraines or spasms)    Past Surgical History:  Procedure Laterality Date  . COLONOSCOPY  2011  . GALLBLADDER SURGERY    . LAPAROSCOPIC OVARIAN CYSTECTOMY Right 12/24/2013   Procedure: LAPAROSCOPIC OVARIAN CYSTECTOMY;  Surgeon: Azalia Bilis, MD;  Location: [facility];  Service: Gynecology;  Laterality: Right;  . UPPER GI ENDOSCOPY  2011  . WISDOM TOOTH EXTRACTION      MEDS:   Current Outpatient Medications on File Prior to Visit  Medication  Sig Dispense Refill  . FLUoxetine (PROZAC) 20 MG capsule Take 1 capsule (20 mg total) by mouth daily. 90 capsule 4  . Levonorgestrel (MIRENA, 52 MG, IU) by Intrauterine route.    . Multiple Vitamins-Minerals (WOMENS MULTIVITAMIN) TABS daily.    . meloxicam (MOBIC) 15 MG tablet Take by mouth daily as needed.     No current facility-administered medications on file prior to visit.     ALLERGIES: Augmentin [amoxicillin-pot clavulanate] and Penicillins  Family History  Problem Relation Age of Onset  . Thyroid disease Mother   . Hyperlipidemia Father   . Cancer Father        prostate cancer  . Breast cancer Maternal Grandmother   . Cancer Maternal Grandmother        bone cancer  . Diabetes Maternal Grandfather   . Anesthesia problems Neg Hx   . Hypotension Neg Hx   . Malignant hyperthermia Neg Hx   . Pseudochol deficiency Neg Hx     SH:  Married, non smoker  Review of Systems  All other systems reviewed and are negative.   PHYSICAL EXAMINATION:    BP 120/82   Pulse 92   Resp 16   Ht 5\' 4"  (1.626 m)   Wt 262 lb 11.2 oz (119.2 kg)   LMP 09/29/2018 (Approximate)   BMI 45.09 kg/m     General appearance: alert, cooperative and appears stated age Lymph:  no  inguinal LAD noted  Pelvic: External genitalia:  The skin tag/warty like lesions on left inferior labia majora noted              Urethra:  normal appearing urethra with no masses, tenderness or lesions              Bartholins and Skenes: normal                 Vagina: normal appearing vagina with normal color and discharge, no lesions              Cervix: no lesions and IUD string noted              Bimanual Exam:  Uterus:  normal size, contour, position, consistency, mobility, non-tender              Adnexa: no mass, fullness, tenderness  Procedure:  Area cleansed with Betadine.  Sterile technique used throughout procedure.  Skin anesthestized with Lidocaine 1% plain; 75mL.  Lesions removed with pick-ups and sterile  scissors.  Silver nitrate applied for excellent hemostasis.  Pt tolerated procedure well.  No dressing applied.    Chaperone was present for exam.  Assessment: Vaginal discharge Mirena IUD present, strings noted today New vulvar lesions excised today H/o parital ovarian resection 5/15 with mature teratoma with carcinoid (24 hr urine for 5-HIAA normal 10/2018  Plan: Affirm obtained today  Lesions sent to pathology.  Results will be called to pt.

## 2018-10-24 NOTE — Telephone Encounter (Signed)
Ms. Luck,  Can you call the office when you have a moment and speak with one of the nurses at [phone].  It sounds like you may need to see Dr. Sabra Heck due to the symptoms you explained, but we would like to talk with you.  Sincerely,  Edrick Kins, BSN RN-BC Triage Nurse [facility]

## 2018-10-24 NOTE — Telephone Encounter (Signed)
Patient returned call to the triage nurse. Previous telephone encounter closed in error, please see for reference. Message details copied below:  Ms. Rane,  Can you call the office when you have a moment and speak with one of the nurses at [phone].  It sounds like you may need to see Dr. Sabra Heck due to the symptoms you explained, but we would like to talk with you.  Sincerely,  Edrick Kins, BSN RN-BC Triage Nurse [facility]

## 2018-10-24 NOTE — Telephone Encounter (Signed)
Spoke with patient.  Reviewed mychart message as below.  Pt having "lots" of clear discharge.  No pain or odor.  No fevers.  Office visit today with Dr. Sabra Heck.  Encounter closed.

## 2018-10-24 NOTE — Telephone Encounter (Signed)
Patient returned call to [facility].

## 2018-10-25 LAB — VAGINITIS/VAGINOSIS, DNA PROBE
Candida Species: NEGATIVE
GARDNERELLA VAGINALIS: NEGATIVE
Trichomonas vaginosis: NEGATIVE

## 2018-10-30 ENCOUNTER — Telehealth: Payer: Self-pay

## 2018-10-30 ENCOUNTER — Encounter: Payer: Self-pay | Admitting: Obstetrics & Gynecology

## 2018-10-30 NOTE — Telephone Encounter (Signed)
-----   Message from Megan Salon, MD sent at 10/30/2018  7:54 AM EDT ----- Please let pt know vulvar lesion excision showed molluscum.  This is caused by a pox virus and can be on the genital region but can also be found in other locations on the body.  She does need to monitor for present of new lesions as something women will have multiple new places.  This is typically self limiting and the pox virus will eventually go away and she won't have any new issues with this.  Vaginitis testing is negative.  Please see if she is still having a lot of discharge.  If this continues, she may want to proceed with STD testing and her risks for this are low but this would be part of the recommended evaluation in someone with .  I did not do this but this would be part of a complete evaluation for vaginal discharge.  Just FYI.

## 2018-10-30 NOTE — Telephone Encounter (Signed)
Spoke with patient. Results and message given as seen below from [place]. Patient verbalizes understanding. Patient declines STD testing at this time. Encounter closed.

## 2018-10-31 ENCOUNTER — Other Ambulatory Visit: Payer: Self-pay

## 2018-10-31 ENCOUNTER — Telehealth: Payer: Self-pay | Admitting: Internal Medicine

## 2018-10-31 ENCOUNTER — Ambulatory Visit: Payer: BLUE CROSS/BLUE SHIELD | Admitting: Obstetrics & Gynecology

## 2018-10-31 ENCOUNTER — Inpatient Hospital Stay (HOSPITAL_BASED_OUTPATIENT_CLINIC_OR_DEPARTMENT_OTHER): Payer: Medicaid Other | Admitting: Internal Medicine

## 2018-10-31 VITALS — BP 133/66 | HR 95 | Temp 98.7°F | Resp 18 | Ht 64.0 in | Wt 262.0 lb

## 2018-10-31 DIAGNOSIS — F329 Major depressive disorder, single episode, unspecified: Secondary | ICD-10-CM

## 2018-10-31 DIAGNOSIS — E669 Obesity, unspecified: Secondary | ICD-10-CM

## 2018-10-31 DIAGNOSIS — N83201 Unspecified ovarian cyst, right side: Secondary | ICD-10-CM

## 2018-10-31 DIAGNOSIS — Z803 Family history of malignant neoplasm of breast: Secondary | ICD-10-CM

## 2018-10-31 DIAGNOSIS — E282 Polycystic ovarian syndrome: Secondary | ICD-10-CM

## 2018-10-31 DIAGNOSIS — Z79899 Other long term (current) drug therapy: Secondary | ICD-10-CM

## 2018-10-31 DIAGNOSIS — R700 Elevated erythrocyte sedimentation rate: Secondary | ICD-10-CM | POA: Diagnosis not present

## 2018-10-31 DIAGNOSIS — M722 Plantar fascial fibromatosis: Secondary | ICD-10-CM

## 2018-10-31 DIAGNOSIS — D72828 Other elevated white blood cell count: Secondary | ICD-10-CM | POA: Diagnosis not present

## 2018-10-31 NOTE — Telephone Encounter (Signed)
Spoke with patient. Message given from [place] as seen below. Reviewed information from up to date. Patient verbalizes understanding. Encounter closed.

## 2018-10-31 NOTE — Telephone Encounter (Signed)
Routing to Dr.Miller for review and advise. 

## 2018-10-31 NOTE — Telephone Encounter (Signed)
Scheduled appts per 03/26 los Patient received avs and calender.

## 2018-10-31 NOTE — Progress Notes (Signed)
Diagnosis Other elevated white blood cell (WBC) count - Plan: CMP ([place] only), Lactate dehydrogenase (LDH), CBC with Differential (Cancer Center Only)  Staging Cancer Staging No matching staging information was found for the patient.  Assessment and Plan:   1. Leukocytosis.   33 year old female referred for evaluation due to leukocytosis.  She denies any fever, chills, night sweats, weight loss, she has noted no adenopathy.  She had lab work done on January 30 of 2020 that showed a white count of 12.5 hemoglobin 13.6 platelets 293,000.  Chemistries were within normal limits with a potassium of 4.7 creatinine 0.64 normal liver function tests.  She reports joint symptoms and PCOS.  She is currently on meloxicam.  She reports after delivery her white count was elevated.  She denies any recent prednisone use.  She denies any family history of leukemias lymphomas.  Patient has family history of breast cancer,   Labs done  10/17/2018 reviewed and showed WBC 12 HB 13.6 plts 286,000.  Chemistries WNL with K+ 4.2 Cr 0.76 LDH 153.  She has an elevated Sed rate of 26.  Pt has negative BCR/ABL and Jak 2.  Clinically relevant mutations are negative.  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  I discussed with her that based on her lab findings she will continue to be monitored with periodic labs and follow-up.  Based on current lab results, pt will not currently be recommended for additional work-up with bone marrow biopsy.  Will discuss case with genetic counselor based on family history of breast cancer and personal history of ovarian carcinoid.  Pt will RTC in 4 months with repeat labs.  Pt expressed understanding of information presented.  All questions answered.    2.  Joint pain.  Pt reports plantar fasciitis.  She has negative SPEP, CRP, RF.  Pt reports she is on Mobic.  Sed rate mildly elevated which is likely related to inflammation.   Follow-up with PCP as directed.    3.  PCOS.  Follow-up with GYN as directed.    4.  Family history of breast cancer.  Will discuss case with genetic counselor due to family history of breast cancer and personal history of ovarian carcinoid.    25 minutes spent with more than 50% spent in review of records, counseling and coordination of care.    Interval History:  Historical data obtained from note dated 10/17/2018.  33 year old female referred for evaluation due to leukocytosis.  She denies any fever, chills, night sweats, weight loss, she has noted no adenopathy.  She had lab work done on September 05, 2018 that showed a white count of 12.5 hemoglobin 13.6 platelets 293,000.  Chemistries were within normal limits with a potassium of 4.7 creatinine 0.64 normal liver function tests.  She reports joint symptoms and PCOS.  She is currently on meloxicam.  She reports after delivery her white count was elevated.  She denies any recent prednisone use.  She denies any family history of leukemias lymphomas.   Current Status:  Pt is seen today for follow-up.  She is here to go over lab results.    Problem List Patient Active Problem List   Diagnosis Date Noted  . Right ovarian cyst [N83.201] 09/26/2018  . Anxiety [F41.9] 03/06/2015  . Female infertility [N97.9] 03/06/2015  . Stomach spasm [K31.89] 03/06/2015  . Obesity, Class III, BMI 40-49.9 (morbid obesity) ([place]) [E66.01] 02/11/2014  . Carcinoid tumor of  ovary [D3A.098] 01/07/2014  . 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 Amenorrhea [N91.2] 11/19/2013  . PCOS (polycystic ovarian syndrome) [E28.2] 09/30/2013  . Migraines [G43.909]   . Depression [F32.9]     Past Medical History Past Medical History:  Diagnosis Date  . Anxiety   . Depression   . Infertility, female    clomid  . Migraines   . PCOS (polycystic ovarian syndrome)   . Stomach problems    (migraines or spasms)    Past Surgical History Past Surgical History:  Procedure  Laterality Date  . COLONOSCOPY  2011  . GALLBLADDER SURGERY    . LAPAROSCOPIC OVARIAN CYSTECTOMY Right 12/24/2013   Procedure: LAPAROSCOPIC OVARIAN CYSTECTOMY;  Surgeon: Azalia Bilis, MD;  Location: [facility];  Service: Gynecology;  Laterality: Right;  . UPPER GI ENDOSCOPY  2011  . WISDOM TOOTH EXTRACTION      Family History Family History  Problem Relation Age of Onset  . Thyroid disease Mother   . Hyperlipidemia Father   . Cancer Father        prostate cancer  . Breast cancer Maternal Grandmother   . Cancer Maternal Grandmother        bone cancer  . Diabetes Maternal Grandfather   . Anesthesia problems Neg Hx   . Hypotension Neg Hx   . Malignant hyperthermia Neg Hx   . Pseudochol deficiency Neg Hx      Social History  reports that she has never smoked. She has never used smokeless tobacco. She reports that she does not drink alcohol or use drugs.  Medications  Current Outpatient Medications:  .  FLUoxetine (PROZAC) 20 MG capsule, Take 1 capsule (20 mg total) by mouth daily., Disp: 90 capsule, Rfl: 4 .  Levonorgestrel (MIRENA, 52 MG, IU), by Intrauterine route., Disp: , Rfl:  .  meloxicam (MOBIC) 15 MG tablet, Take by mouth daily as needed., Disp: , Rfl:  .  Multiple Vitamins-Minerals (WOMENS MULTIVITAMIN) TABS, daily., Disp: , Rfl:   Allergies Augmentin [amoxicillin-pot clavulanate] and Penicillins  Review of Systems Review of Systems - Oncology ROS negative other than abdominal symptoms and joint pain   Physical Exam  Vitals Wt Readings from Last 3 Encounters:  10/31/18 262 lb (118.8 kg)  10/24/18 262 lb 11.2 oz (119.2 kg)  10/17/18 259 lb 8 oz (117.7 kg)   Temp Readings from Last 3 Encounters:  10/31/18 98.7 F (37.1 C) (Oral)  10/17/18 98.7 F (37.1 C) (Oral)  09/26/18 98.3 F (36.8 C) (Oral)   BP Readings from Last 3 Encounters:  10/31/18 133/66  10/24/18 120/82  10/17/18 126/90   Pulse Readings from Last 3 Encounters:  10/31/18 95  10/24/18 92   10/17/18 79   Constitutional: Well-developed, well-nourished, and in no distress.   HENT: Head: Normocephalic and atraumatic.  Mouth/Throat: No oropharyngeal exudate. Mucosa moist. Eyes: Pupils are equal, round, and reactive to light. Conjunctivae are normal. No scleral icterus.  Neck: Normal range of motion. Neck supple. No JVD present.  Cardiovascular: Normal rate, regular rhythm and normal heart sounds.  Exam reveals no gallop and no friction rub.   No murmur heard. Pulmonary/Chest: Effort normal and breath sounds normal. No respiratory distress. No wheezes.No rales.  Abdominal: Soft. Bowel sounds are normal. Obese.  Generalized tenderness to palpation.  There is no guarding.  Musculoskeletal: No edema or tenderness.  Lymphadenopathy: No cervical, axillary or supraclavicular adenopathy.  Neurological: Alert and oriented to person, place, and time. No cranial nerve deficit.  Skin: Skin is  warm and dry. No rash noted. No erythema. No pallor.  Psychiatric: Affect and judgment normal.   Labs No visits with results within 3 Day(s) from this visit.  Latest known visit with results is:  Office Visit on 10/24/2018  Component Date Value Ref Range Status  . Candida Species 10/24/2018 Negative  Negative Final  . Gardnerella vaginalis 10/24/2018 Negative  Negative Final  . Trichomonas vaginosis 10/24/2018 Negative  Negative Final  . Preg Test, Ur 10/24/2018 Negative  Negative Final     Pathology Orders Placed This Encounter  Procedures  . CMP ([place] only)    Standing Status:   Future    Standing Expiration Date:   10/31/2019  . Lactate dehydrogenase (LDH)    Standing Status:   Future    Standing Expiration Date:   10/31/2019  . CBC with Differential (Cancer Center Only)    Standing Status:   Future    Standing Expiration Date:   10/31/2019       Zoila Shutter MD

## 2018-10-31 NOTE — Telephone Encounter (Signed)
He would likely see a urology or PCP if has new vulvar skin lesion symptoms.  Some people seem to be more susceptible to the pox virus that causes molluscum and, yes, he's likely already been exposed but that doesn't mean doesn't mean he's going to have any symptoms.  He or she should inspect once a month or so.    It's very common with kids.  Also may be helpful to review Up To date with her.  Thanks.

## 2018-10-31 NOTE — Telephone Encounter (Signed)
Patient sent the following correspondence through [place]. Routing to triage to assist patient with request.  So after speaking to the nurse on the phone today I had a couple of follow up questions. If the molluscum can be spread through skin to skin contact and I have no clue when those bumps showed up in the first place it's conceivable I already spread it to my husband through intercourse if it has a long incubation period. I don't know. So if he does potentially have it will have it is it possible for him to spread it back to me if we were to have intercourse? I don't want to play volleyball with a skin virus. ha. If I did spread it to him before we knew what it was does he also need to see a dr to have it removed? If so what type of Dr would do that for a female. Lots of questions and what ifs I know but this kinda caught [place] off guard and since we have never heard of it we want to make sure we do the correct things but still able to be intimate if thats safe obviously. Thanks so much.

## 2018-11-06 LAB — JAK2 (INCLUDING V617F AND EXON 12), MPL,& CALR W/RFL MPN PANEL (NGS)

## 2018-11-06 LAB — BCR ABL1 FISH (GENPATH)

## 2018-12-19 ENCOUNTER — Encounter: Payer: Self-pay | Admitting: Obstetrics & Gynecology

## 2018-12-19 ENCOUNTER — Other Ambulatory Visit: Payer: Self-pay

## 2018-12-19 ENCOUNTER — Ambulatory Visit: Payer: Medicaid Other | Admitting: Obstetrics & Gynecology

## 2018-12-19 ENCOUNTER — Ambulatory Visit (INDEPENDENT_AMBULATORY_CARE_PROVIDER_SITE_OTHER): Payer: Medicaid Other

## 2018-12-19 ENCOUNTER — Other Ambulatory Visit (HOSPITAL_COMMUNITY)
Admission: RE | Admit: 2018-12-19 | Discharge: 2018-12-19 | Disposition: A | Discharge status: Home / Self Care | Payer: Medicaid Other | Source: Ambulatory Visit | Attending: Obstetrics & Gynecology | Admitting: Obstetrics & Gynecology

## 2018-12-19 VITALS — BP 118/90 | HR 80 | Temp 98.1°F | Ht 64.0 in | Wt 258.0 lb

## 2018-12-19 DIAGNOSIS — R935 Abnormal findings on diagnostic imaging of other abdominal regions, including retroperitoneum: Secondary | ICD-10-CM | POA: Diagnosis not present

## 2018-12-19 DIAGNOSIS — Z124 Encounter for screening for malignant neoplasm of cervix: Secondary | ICD-10-CM

## 2018-12-19 DIAGNOSIS — Z01419 Encounter for gynecological examination (general) (routine) without abnormal findings: Secondary | ICD-10-CM

## 2018-12-19 MED ORDER — FLUOXETINE HCL 20 MG PO CAPS: 20.0000 mg | ORAL_CAPSULE | Freq: Every day | ORAL | 4 refills | Status: DC

## 2018-12-19 NOTE — Progress Notes (Signed)
33 y.o. G31P2002 Married White or Caucasian female here for annual exam as well as follow-up ultrasound.  Has Mirena IUD in place.  Had 10 days of bleeding in the end of February.  In March, had discharge for about 14 days.  Needed a panty liner for this.  This discharge was all clear.  In April, had bleeding that was light and lasted for 7 days.  Had one day of spotting in May.  Bleeding is about every month.  Pt wonders if she is having a cycle now.    Had elevated WBC ct and saw Dr. Walden Field.  Work up showed a genomic alteration of DNMT3A [id number] which increases her risk for leukemia in the future.  Has follow up scheduled in four months for repeat blood work.  As well, she saw Dr. Denman George in consultation regarding right ovarian mass and hx of ovarian carcinoid tumor.  Serotonin level testing has been normal.  Repeat PUS recommended for three months.  This has been done today as well.    Uterus:  8.9 x 4.9 x 3.8cm with correctly place IUD present Right ovary: 5.7 x 4.4 x 5.7vm with avascular internal echoes.  Solid appearance is more obvious today and is enlarged in size from 33 x 25 x 24mm Left ovary:  3.8 x 3.6 x 3.1cm with 3.8 x 3.5 x 2.7cm cyst also increased in size today in regard to cyst spaces.  Internal solid lesion (possible dermoid) is unchanged in size.  Findings are also avascular [place] sac:  No free fluid  No LMP recorded. (Menstrual status: IUD).          Sexually active: Yes.    The current method of family planning is IUD. Mirena placed 04/30/17  Exercising: Yes.    walking Smoker:  no  Health Maintenance: Pap:  10/24/16 Neg. HR HPV:neg  History of abnormal Pap:  no MMG: Never Colonoscopy:  07/2010 Normal  BMD:   Never TDaP:  2012 Screening Labs: not indicated   reports that she has never smoked. She has never used smokeless tobacco. She reports that she does not drink alcohol or use drugs.  Past Medical History:  Diagnosis Date  . Anxiety   . Depression   .  Infertility, female    clomid  . Migraines   . PCOS (polycystic ovarian syndrome)   . Stomach problems    (migraines or spasms)    Past Surgical History:  Procedure Laterality Date  . COLONOSCOPY  2011  . GALLBLADDER SURGERY    . LAPAROSCOPIC OVARIAN CYSTECTOMY Right 12/24/2013   Procedure: LAPAROSCOPIC OVARIAN CYSTECTOMY;  Surgeon: Azalia Bilis, MD;  Location: [facility];  Service: Gynecology;  Laterality: Right;  . UPPER GI ENDOSCOPY  2011  . WISDOM TOOTH EXTRACTION      Current Outpatient Medications  Medication Sig Dispense Refill  . FLUoxetine (PROZAC) 20 MG capsule Take 1 capsule (20 mg total) by mouth daily. 90 capsule 4  . Levonorgestrel (MIRENA, 52 MG, IU) by Intrauterine route.    . meloxicam (MOBIC) 15 MG tablet Take by mouth daily as needed.    . Multiple Vitamins-Minerals (WOMENS MULTIVITAMIN) TABS daily.     No current facility-administered medications for this visit.     Family History  Problem Relation Age of Onset  . Thyroid disease Mother   . Hyperlipidemia Father   . Cancer Father        prostate cancer  . Breast cancer Maternal Grandmother   . Cancer  Maternal Grandmother        bone cancer  . Diabetes Maternal Grandfather   . Anesthesia problems Neg Hx   . Hypotension Neg Hx   . Malignant hyperthermia Neg Hx   . Pseudochol deficiency Neg Hx     Review of Systems  Genitourinary: Positive for vaginal bleeding.  All other systems reviewed and are negative.   Exam:   BP 118/90   Pulse 80   Temp 98.1 F (36.7 C) (Temporal)   Ht 5\' 4"  (1.626 m)   Wt 258 lb (117 kg)   BMI 44.29 kg/m   Height: 5\' 4"  (162.6 cm)  Ht Readings from Last 3 Encounters:  12/19/18 5\' 4"  (1.626 m)  10/31/18 5\' 4"  (1.626 m)  10/24/18 5\' 4"  (1.626 m)    General appearance: alert, cooperative and appears stated age Head: Normocephalic, without obvious abnormality, atraumatic Neck: no adenopathy, supple, symmetrical, trachea midline and thyroid normal to inspection and  palpation Lungs: clear to auscultation bilaterally Breasts: normal appearance, no masses or tenderness Heart: regular rate and rhythm Abdomen: soft, non-tender; bowel sounds normal; no masses,  no organomegaly Extremities: extremities normal, atraumatic, no cyanosis or edema Skin: Skin color, texture, turgor normal. No rashes or lesions Lymph nodes: Cervical, supraclavicular, and axillary nodes normal. No abnormal inguinal nodes palpated Neurologic: Grossly normal   Pelvic: External genitalia:  no lesions              Urethra:  normal appearing urethra with no masses, tenderness or lesions              Bartholins and Skenes: normal                 Vagina: normal appearing vagina with normal color and discharge, no lesions              Cervix: no lesions              Pap taken: Yes.   Bimanual Exam:  Uterus:  normal size, contour, position, consistency, mobility, non-tender              Adnexa: normal adnexa and no mass, fullness, tenderness               Rectovaginal: Confirms               Anus:  normal sphincter tone, no lesions  Chaperone was present for exam.  A:  Well Woman with normal exam H/o PCOS with prolonged episodes of amenorrhea most likely due to anovulation, now with Mirena IUD and irregular bleeding Family hx of breast cancer in MGM Obesity H/o mature teratoma excision with small carcinoid in pathology, increased solid findings on right ovary today Anxiety/depression/stressors  Elevated WBC ct, followed by hematology Obesity  P:   Mammogram guidelines reviewed.  Will plan starting MMGs at age 50 due to family hx pap smear obtained today Refer back to Dr. Denman George for removal of right ovary RF for Prozac 20mg  daily.  #90/4RF Blood work UTD Return annually or prn  In additional to AEX, additional 15 minutes spent with pt in face to face discussion of ultrasound results/recommendations.

## 2018-12-20 ENCOUNTER — Ambulatory Visit: Payer: Self-pay | Admitting: Obstetrics & Gynecology

## 2018-12-23 ENCOUNTER — Telehealth: Payer: Self-pay | Admitting: Oncology

## 2018-12-23 NOTE — Telephone Encounter (Signed)
Called [name] and scheduled a follow up visit with Dr. Denman George on 12/25/18 at 2:30 pm.  She verbalized understanding and agreement.

## 2018-12-24 ENCOUNTER — Telehealth: Payer: Self-pay | Admitting: *Deleted

## 2018-12-24 NOTE — Telephone Encounter (Signed)
Spoke with patient. Patient has an appointment with Dr. Denman George on 12/25/18 at 2:30pm. Patient states [facility] contacted her directly to schedule. Patient thankful for f/u.  Routing to provider for final review. Patient is agreeable to disposition. Will close encounter.

## 2018-12-24 NOTE — Telephone Encounter (Signed)
-----   Message from Megan Salon, MD sent at 12/23/2018 12:13 PM EDT ----- Regarding: follow up with Dr. Jodean Lima, Can you let pt know (and document) that Dr. Denman George does feel like ovary needs to be removed.  She does need follow up.  Does she want [place] to call for appt or does she feel comfortable doing that?  Thanks.  Vinnie Level

## 2018-12-25 ENCOUNTER — Other Ambulatory Visit: Payer: Self-pay

## 2018-12-25 ENCOUNTER — Inpatient Hospital Stay: Payer: Medicaid Other | Attending: Gynecologic Oncology | Admitting: Gynecologic Oncology

## 2018-12-25 ENCOUNTER — Encounter: Payer: Self-pay | Admitting: Gynecologic Oncology

## 2018-12-25 VITALS — BP 133/80 | HR 88 | Temp 98.5°F | Resp 18 | Ht 64.0 in | Wt 258.0 lb

## 2018-12-25 DIAGNOSIS — C569 Malignant neoplasm of unspecified ovary: Secondary | ICD-10-CM | POA: Insufficient documentation

## 2018-12-25 DIAGNOSIS — Z90710 Acquired absence of both cervix and uterus: Secondary | ICD-10-CM

## 2018-12-25 DIAGNOSIS — Z90722 Acquired absence of ovaries, bilateral: Secondary | ICD-10-CM | POA: Insufficient documentation

## 2018-12-25 DIAGNOSIS — N83201 Unspecified ovarian cyst, right side: Secondary | ICD-10-CM

## 2018-12-25 LAB — CYTOLOGY - PAP
Diagnosis: UNDETERMINED — AB
HPV: NOT DETECTED

## 2018-12-25 MED ORDER — SENNOSIDES-DOCUSATE SODIUM 8.6-50 MG PO TABS: 2.0000 | ORAL_TABLET | Freq: Every day | ORAL | 0 refills | Status: DC

## 2018-12-25 MED ORDER — OXYCODONE HCL 5 MG PO TABS: 5.0000 mg | ORAL_TABLET | ORAL | 0 refills | Status: DC | PRN

## 2018-12-25 MED ORDER — IBUPROFEN 600 MG PO TABS: 600.0000 mg | ORAL_TABLET | Freq: Four times a day (QID) | ORAL | 0 refills | Status: DC | PRN

## 2018-12-25 NOTE — Progress Notes (Signed)
Follow-up Note: Gyn-Onc  Consult was initially requested by Dr. Sabra Heck for the evaluation of [name] 33 y.o. female  CC:  Chief Complaint  Patient presents with  . Right ovarian cyst    Assessment/Plan:  [name]  is a 33 y.o.  year old with a history of a right ovarian carcinoid tumor and a new right ovarian solid lesion that is growing in size. She has some symptoms of right sided pelvic pain and "pregnancy like" symptoms. She is morbidly obese with a BMI of 44kg/m2.   She has some increase in size in the solid area on the right. I explained the subjective nature of ultrasound evaluations and that we cannot be certain regarding whether or not the right ovary contains residual carcinoid tumor. Additionally, given her new flushing symptoms, this is concerning.  I explained that carcinoid tumors are slow growing, and therefore it continues to be reasonable for expectant management. However, if she would like resolution regarding the overlying question of whether or not this lesion is residual disease, the only option would be surgery. We would remove the right tube and ovary and perform a cystectomy on the left if a visible lesion is present.   I will perform this procedure with robotic assistance. I explained surgical recovery, and anticipated risks including  bleeding, infection, damage to internal organs (such as bladder,ureters, bowels), blood clot, reoperation and rehospitalization. I explained the increased risk of death associated with receiving surgery while infected with COVID-19 therefore instructed her to quarantine for 2 weeks preop and notified her that she will be tested preop and surgery cancelled if it is positive.   HPI: Ms [name] is a 33 year old obese (BMI 64) P2, who is seen in consultation at the request of Dr Sabra Heck for a right ovarian cyst in the setting of a history of a right carcinoid tumor of the ovary.   The patient's tumor history  began in 2015 when she underwent a laparoscopic right ovarian cystectomy.  Final pathology revealed a dermoid cyst with a 2.5 mm focus of carcinoid tumor.  Prior to that ovarian cystectomy she had been experiencing pregnancy-like symptoms.  She was seen by Dr. Marko Plume postoperatively who ordered a 24-hour urine5HIAA which was normal.  She was recommended to undergo close surveillance with high anticipation that the carcinoid had been completely resected.  Over the subsequent years she is undergone multiple serial ultrasounds with Dr. Sabra Heck due to her diagnosis of PCOS, the history of the prior carcinoid tumor, a new finding of a left ovarian dermoid cyst, and a history of abnormal uterine bleeding with thickened endometrium.  She is a Mirena IUD in place which is controlling her bleeding symptoms.  Her most recent ultrasound was on August 29, 2018.  The preceding ultrasound had been in August 2019.  It demonstrated a uterus measuring 8.7 x 4.8 x 3.2 cm with an IUD in situ.  The endometrium was 7 mm.  The left ovary was 2.8 x 1.9 x 1.9 cm with a 9 mm avascular dermoid cyst that was stable in size.  The right ovary measured 3.5 x 3 x 3.4 cm in total, with a 3.3 x 2.5 x 2.7 cm new cystic lesion with internal calcification, avascular but increased in size from May 2019.  It appeared most consistent with a dermoid.  She underwent screening urine 24-hour 5HIAA which was normal at 3.9.  The patient has a history of 2 prior vaginal deliveries, she required  assistance with Clomid therapy in order to conceive.  She carries a diagnosis of PCOS.  She is morbidly obese with a BMI of 45 kg/m.  Her prior surgeries include a laparoscopic cholecystectomy and a laparoscopic right ovarian cystectomy.  Interval Hx:  The patient was offered options of expectant management with serial ultrasounds or proceeding with surgery at the time of her consultation with me in February 2020.  She elected for close observation.  A  repeat ultrasound scan was performed on May14, 2020 which revealed a uterus measuring 8.9 x 4.9 x 3.8 cm with a correctly placed IUD.  A right ovary measured 5.7 x 4.4 x 5.7 cm with avascular internal echoes.  There was a solid appearance which was more obvious today and had enlarged in size from 3.3 x 2.5 x 2.7 cm.  The left ovary measured 3.8 x 3.6 x 3.1 cm with a 2.8 x 3.5 x 2.7 cm cyst which had also increased in size today compared to before and internal solid lesion possibly a dermoid unchanged in size.  There is no free fluid in the cul-de-sac.  The patient reported symptoms of intermittent flushing in the 2 months prior to this repeat ultrasound scan.  Current Meds:  Outpatient Encounter Medications as of 12/25/2018  Medication Sig  . FLUoxetine (PROZAC) 20 MG capsule Take 1 capsule (20 mg total) by mouth daily.  . Levonorgestrel (MIRENA, 52 MG, IU) by Intrauterine route.  . meloxicam (MOBIC) 15 MG tablet Take by mouth daily as needed.  . Multiple Vitamins-Minerals (WOMENS MULTIVITAMIN) TABS daily.   No facility-administered encounter medications on file as of 12/25/2018.     Allergy:  Allergies  Allergen Reactions  . Augmentin [Amoxicillin-Pot Clavulanate] Swelling    Facial swelling  . Penicillins Other (See Comments)    Reaction:  Unknown     Social Hx:   Social History   Socioeconomic History  . Marital status: Married    Spouse name: Not on file  . Number of children: Not on file  . Years of education: Not on file  . Highest education level: Not on file  Occupational History  . Not on file  Social Needs  . Financial resource strain: Not on file  . Food insecurity:    Worry: Not on file    Inability: Not on file  . Transportation needs:    Medical: Not on file    Non-medical: Not on file  Tobacco Use  . Smoking status: Never Smoker  . Smokeless tobacco: Never Used  Substance and Sexual Activity  . Alcohol use: No  . Drug use: No  . Sexual activity: Yes     Partners: Male    Birth control/protection: I.U.D.    Comment: Mirena inserted 04/30/17  Lifestyle  . Physical activity:    Days per week: Not on file    Minutes per session: Not on file  . Stress: Not on file  Relationships  . Social connections:    Talks on phone: Not on file    Gets together: Not on file    Attends religious service: Not on file    Active member of club or organization: Not on file    Attends meetings of clubs or organizations: Not on file    Relationship status: Not on file  . Intimate partner violence:    Fear of current or ex partner: Not on file    Emotionally abused: Not on file    Physically abused: Not on file  Forced sexual activity: Not on file  Other Topics Concern  . Not on file  Social History Narrative  . Not on file    Past Surgical Hx:  Past Surgical History:  Procedure Laterality Date  . COLONOSCOPY  2011  . GALLBLADDER SURGERY    . LAPAROSCOPIC OVARIAN CYSTECTOMY Right 12/24/2013   Procedure: LAPAROSCOPIC OVARIAN CYSTECTOMY;  Surgeon: Azalia Bilis, MD;  Location: [facility];  Service: Gynecology;  Laterality: Right;  . UPPER GI ENDOSCOPY  2011  . WISDOM TOOTH EXTRACTION      Past Medical Hx:  Past Medical History:  Diagnosis Date  . Anxiety   . Depression   . Infertility, female    clomid  . Migraines   . PCOS (polycystic ovarian syndrome)   . Stomach problems    (migraines or spasms)    Past Gynecological History:  See HPI No LMP recorded. (Menstrual status: IUD).  Family Hx:  Family History  Problem Relation Age of Onset  . Thyroid disease Mother   . Hyperlipidemia Father   . Cancer Father        prostate cancer  . Breast cancer Maternal Grandmother   . Cancer Maternal Grandmother        bone cancer  . Diabetes Maternal Grandfather   . Anesthesia problems Neg Hx   . Hypotension Neg Hx   . Malignant hyperthermia Neg Hx   . Pseudochol deficiency Neg Hx     Review of Systems:  Constitutional  + 2 months of  intermittent flushing ENT Normal appearing ears and nares bilaterally Skin/Breast  No rash, sores, jaundice, itching, dryness Cardiovascular  No chest pain, shortness of breath, or edema  Pulmonary  No cough or wheeze.  Gastro Intestinal  No nausea, vomitting, or diarrhoea. No bright red blood per rectum, no abdominal pain, change in bowel movement, or constipation.  Genito Urinary  No frequency, urgency, dysuria, + right pelvic pain Musculo Skeletal  No myalgia, arthralgia, joint swelling or pain  Neurologic  No weakness, numbness, change in gait,  Psychology  No depression, anxiety, insomnia.   Vitals:  Blood pressure 133/80, pulse 88, temperature 98.5 F (36.9 C), resp. rate 18, height 5\' 4"  (1.626 m), weight 258 lb (117 kg), SpO2 99 %, currently breastfeeding.  Physical Exam: WD in NAD Neck  Supple NROM, without any enlargements.  Lymph Node Survey No cervical supraclavicular or inguinal adenopathy Cardiovascular  Pulse normal rate, regularity and rhythm. S1 and S2 normal.  Lungs  Clear to auscultation bilateraly, without wheezes/crackles/rhonchi. Good air movement.  Skin  No rash/lesions/breakdown  Psychiatry  Alert and oriented to person, place, and time  Abdomen  Normoactive bowel sounds, abdomen soft, non-tender and obese without evidence of hernia.  Back No CVA tenderness Genito Urinary  Vulva/vagina: Normal external female genitalia.  No lesions. No discharge or bleeding.  Bladder/urethra:  No lesions or masses, well supported bladder  Vagina: normal  Cervix: Normal appearing, no lesions.  Uterus:  Small, mobile, no parametrial involvement or nodularity.  Adnexa: no discretely palpable masses. Rectal  deferred Extremities  No bilateral cyanosis, clubbing or edema.   Thereasa Solo, MD  12/25/2018, 3:35 PM

## 2018-12-25 NOTE — H&P (View-Only) (Signed)
Follow-up Note: Gyn-Onc  Consult was initially requested by Dr. Sabra Heck for the evaluation of [name] 33 y.o. female  CC:  Chief Complaint  Patient presents with  . Right ovarian cyst    Assessment/Plan:  [name]  is a 33 y.o.  year old with a history of a right ovarian carcinoid tumor and a new right ovarian solid lesion that is growing in size. She has some symptoms of right sided pelvic pain and "pregnancy like" symptoms. She is morbidly obese with a BMI of 44kg/m2.   She has some increase in size in the solid area on the right. I explained the subjective nature of ultrasound evaluations and that we cannot be certain regarding whether or not the right ovary contains residual carcinoid tumor. Additionally, given her new flushing symptoms, this is concerning.  I explained that carcinoid tumors are slow growing, and therefore it continues to be reasonable for expectant management. However, if she would like resolution regarding the overlying question of whether or not this lesion is residual disease, the only option would be surgery. We would remove the right tube and ovary and perform a cystectomy on the left if a visible lesion is present.   I will perform this procedure with robotic assistance. I explained surgical recovery, and anticipated risks including  bleeding, infection, damage to internal organs (such as bladder,ureters, bowels), blood clot, reoperation and rehospitalization. I explained the increased risk of death associated with receiving surgery while infected with COVID-19 therefore instructed her to quarantine for 2 weeks preop and notified her that she will be tested preop and surgery cancelled if it is positive.   HPI: [name] is a 33 year old obese (BMI 34) P2, who is seen in consultation at the request of Dr Sabra Heck for a right ovarian cyst in the setting of a history of a right carcinoid tumor of the ovary.   The patient's tumor history  began in 2015 when she underwent a laparoscopic right ovarian cystectomy.  Final pathology revealed a dermoid cyst with a 2.5 mm focus of carcinoid tumor.  Prior to that ovarian cystectomy she had been experiencing pregnancy-like symptoms.  She was seen by Dr. Marko Plume postoperatively who ordered a 24-hour urine5HIAA which was normal.  She was recommended to undergo close surveillance with high anticipation that the carcinoid had been completely resected.  Over the subsequent years she is undergone multiple serial ultrasounds with Dr. Sabra Heck due to her diagnosis of PCOS, the history of the prior carcinoid tumor, a new finding of a left ovarian dermoid cyst, and a history of abnormal uterine bleeding with thickened endometrium.  She is a Mirena IUD in place which is controlling her bleeding symptoms.  Her most recent ultrasound was on August 29, 2018.  The preceding ultrasound had been in August 2019.  It demonstrated a uterus measuring 8.7 x 4.8 x 3.2 cm with an IUD in situ.  The endometrium was 7 mm.  The left ovary was 2.8 x 1.9 x 1.9 cm with a 9 mm avascular dermoid cyst that was stable in size.  The right ovary measured 3.5 x 3 x 3.4 cm in total, with a 3.3 x 2.5 x 2.7 cm new cystic lesion with internal calcification, avascular but increased in size from May 2019.  It appeared most consistent with a dermoid.  She underwent screening urine 24-hour 5HIAA which was normal at 3.9.  The patient has a history of 2 prior vaginal deliveries, she required  assistance with Clomid therapy in order to conceive.  She carries a diagnosis of PCOS.  She is morbidly obese with a BMI of 45 kg/m.  Her prior surgeries include a laparoscopic cholecystectomy and a laparoscopic right ovarian cystectomy.  Interval Hx:  The patient was offered options of expectant management with serial ultrasounds or proceeding with surgery at the time of her consultation with me in February 2020.  She elected for close observation.  A  repeat ultrasound scan was performed on May14, 2020 which revealed a uterus measuring 8.9 x 4.9 x 3.8 cm with a correctly placed IUD.  A right ovary measured 5.7 x 4.4 x 5.7 cm with avascular internal echoes.  There was a solid appearance which was more obvious today and had enlarged in size from 3.3 x 2.5 x 2.7 cm.  The left ovary measured 3.8 x 3.6 x 3.1 cm with a 2.8 x 3.5 x 2.7 cm cyst which had also increased in size today compared to before and internal solid lesion possibly a dermoid unchanged in size.  There is no free fluid in the cul-de-sac.  The patient reported symptoms of intermittent flushing in the 2 months prior to this repeat ultrasound scan.  Current Meds:  Outpatient Encounter Medications as of 12/25/2018  Medication Sig  . FLUoxetine (PROZAC) 20 MG capsule Take 1 capsule (20 mg total) by mouth daily.  . Levonorgestrel (MIRENA, 52 MG, IU) by Intrauterine route.  . meloxicam (MOBIC) 15 MG tablet Take by mouth daily as needed.  . Multiple Vitamins-Minerals (WOMENS MULTIVITAMIN) TABS daily.   No facility-administered encounter medications on file as of 12/25/2018.     Allergy:  Allergies  Allergen Reactions  . Augmentin [Amoxicillin-Pot Clavulanate] Swelling    Facial swelling  . Penicillins Other (See Comments)    Reaction:  Unknown     Social Hx:   Social History   Socioeconomic History  . Marital status: Married    Spouse name: Not on file  . Number of children: Not on file  . Years of education: Not on file  . Highest education level: Not on file  Occupational History  . Not on file  Social Needs  . Financial resource strain: Not on file  . Food insecurity:    Worry: Not on file    Inability: Not on file  . Transportation needs:    Medical: Not on file    Non-medical: Not on file  Tobacco Use  . Smoking status: Never Smoker  . Smokeless tobacco: Never Used  Substance and Sexual Activity  . Alcohol use: No  . Drug use: No  . Sexual activity: Yes     Partners: Male    Birth control/protection: I.U.D.    Comment: Mirena inserted 04/30/17  Lifestyle  . Physical activity:    Days per week: Not on file    Minutes per session: Not on file  . Stress: Not on file  Relationships  . Social connections:    Talks on phone: Not on file    Gets together: Not on file    Attends religious service: Not on file    Active member of club or organization: Not on file    Attends meetings of clubs or organizations: Not on file    Relationship status: Not on file  . Intimate partner violence:    Fear of current or ex partner: Not on file    Emotionally abused: Not on file    Physically abused: Not on file  Forced sexual activity: Not on file  Other Topics Concern  . Not on file  Social History Narrative  . Not on file    Past Surgical Hx:  Past Surgical History:  Procedure Laterality Date  . COLONOSCOPY  2011  . GALLBLADDER SURGERY    . LAPAROSCOPIC OVARIAN CYSTECTOMY Right 12/24/2013   Procedure: LAPAROSCOPIC OVARIAN CYSTECTOMY;  Surgeon: Azalia Bilis, MD;  Location: [facility];  Service: Gynecology;  Laterality: Right;  . UPPER GI ENDOSCOPY  2011  . WISDOM TOOTH EXTRACTION      Past Medical Hx:  Past Medical History:  Diagnosis Date  . Anxiety   . Depression   . Infertility, female    clomid  . Migraines   . PCOS (polycystic ovarian syndrome)   . Stomach problems    (migraines or spasms)    Past Gynecological History:  See HPI No LMP recorded. (Menstrual status: IUD).  Family Hx:  Family History  Problem Relation Age of Onset  . Thyroid disease Mother   . Hyperlipidemia Father   . Cancer Father        prostate cancer  . Breast cancer Maternal Grandmother   . Cancer Maternal Grandmother        bone cancer  . Diabetes Maternal Grandfather   . Anesthesia problems Neg Hx   . Hypotension Neg Hx   . Malignant hyperthermia Neg Hx   . Pseudochol deficiency Neg Hx     Review of Systems:  Constitutional  + 2 months of  intermittent flushing ENT Normal appearing ears and nares bilaterally Skin/Breast  No rash, sores, jaundice, itching, dryness Cardiovascular  No chest pain, shortness of breath, or edema  Pulmonary  No cough or wheeze.  Gastro Intestinal  No nausea, vomitting, or diarrhoea. No bright red blood per rectum, no abdominal pain, change in bowel movement, or constipation.  Genito Urinary  No frequency, urgency, dysuria, + right pelvic pain Musculo Skeletal  No myalgia, arthralgia, joint swelling or pain  Neurologic  No weakness, numbness, change in gait,  Psychology  No depression, anxiety, insomnia.   Vitals:  Blood pressure 133/80, pulse 88, temperature 98.5 F (36.9 C), resp. rate 18, height 5\' 4"  (1.626 m), weight 258 lb (117 kg), SpO2 99 %, currently breastfeeding.  Physical Exam: WD in NAD Neck  Supple NROM, without any enlargements.  Lymph Node Survey No cervical supraclavicular or inguinal adenopathy Cardiovascular  Pulse normal rate, regularity and rhythm. S1 and S2 normal.  Lungs  Clear to auscultation bilateraly, without wheezes/crackles/rhonchi. Good air movement.  Skin  No rash/lesions/breakdown  Psychiatry  Alert and oriented to person, place, and time  Abdomen  Normoactive bowel sounds, abdomen soft, non-tender and obese without evidence of hernia.  Back No CVA tenderness Genito Urinary  Vulva/vagina: Normal external female genitalia.  No lesions. No discharge or bleeding.  Bladder/urethra:  No lesions or masses, well supported bladder  Vagina: normal  Cervix: Normal appearing, no lesions.  Uterus:  Small, mobile, no parametrial involvement or nodularity.  Adnexa: no discretely palpable masses. Rectal  deferred Extremities  No bilateral cyanosis, clubbing or edema.   Thereasa Solo, MD  12/25/2018, 3:35 PM

## 2018-12-25 NOTE — Patient Instructions (Signed)
Preparing for your Surgery  Plan for surgery on January 23, 2019 with Dr. Everitt Amber at [place] will be scheduled for a robotic assisted right salpingo-oophorectomy, left ovarian cystectomy.   Pre-operative Testing -You will receive a phone call from presurgical testing at [facility] if you have not received a call already to arrange for a pre-operative testing appointment before your surgery.  This appointment normally occurs one to two weeks before your scheduled surgery.   -Bring your insurance card, copy of an advanced directive if applicable, medication list  -At that visit, you will be asked to sign a consent for a possible blood transfusion in case a transfusion becomes necessary during surgery.  The need for a blood transfusion is rare but having consent is a necessary part of your care.     -You should not be taking blood thinners or aspirin at least ten days prior to surgery unless instructed by your surgeon.  -Do not take supplements such as fish oil (omega 3), red yeast rice, tumeric before your surgery.  Day Before Surgery at [place] will be asked to take in a light diet the day before surgery.  Avoid carbonated beverages.  You will be advised to have nothing to eat or drink after midnight the evening before.    Eat a light diet the day before surgery.  Examples including soups, broths, toast, yogurt, mashed potatoes.  Things to avoid include carbonated beverages (fizzy beverages), raw fruits and raw vegetables, or beans.   If your bowels are filled with gas, your surgeon will have difficulty visualizing your pelvic organs which increases your surgical risks.  Your role in recovery Your role is to become active as soon as directed by your doctor, while still giving yourself time to heal.  Rest when you feel tired. You will be asked to do the following in order to speed your recovery:  - Cough and breathe deeply. This helps toclear and expand your lungs  and can prevent pneumonia. You may be given a spirometer to practice deep breathing. A staff member will show you how to use the spirometer. - Do mild physical activity. Walking or moving your legs help your circulation and body functions return to normal. A staff member will help you when you try to walk and will provide you with simple exercises. Do not try to get up or walk alone the first time. - Actively manage your pain. Managing your pain lets you move in comfort. We will ask you to rate your pain on a scale of zero to 10. It is your responsibility to tell your doctor or nurse where and how much you hurt so your pain can be treated.  Special Considerations -If you are diabetic, you may be placed on insulin after surgery to have closer control over your blood sugars to promote healing and recovery.  This does not mean that you will be discharged on insulin.  If applicable, your oral antidiabetics will be resumed when you are tolerating a solid diet.  -Your final pathology results from surgery should be available around one week after surgery and the results will be relayed to you when available.  -Dr. Lahoma Crocker is the surgeon that assists your GYN Oncologist with surgery.  If you end up staying the night, the next day after your surgery you will either see Dr. Denman George or Dr. Lahoma Crocker.  -FMLA forms can be faxed to [phone] and please allow 5-7 business days for  completion.  Pain Management After Surgery -You have been prescribed your pain medication and bowel regimen medications before surgery so that you can have these available when you are discharged from the hospital. The pain medication is for use ONLY AFTER surgery and a new prescription will not be given.   -Make sure that you have Tylenol and Ibuprofen at home to use on a regular basis after surgery for pain control. We recommend alternating the medications every hour to six hours since they work differently and are  processed in the body differently for pain relief.  -Review the attached handout on narcotic use and their risks and side effects.   Bowel Regimen -You have been prescribed Sennakot-S to take nightly to prevent constipation especially if you are taking the narcotic pain medication intermittently.  It is important to prevent constipation and drink adequate amounts of liquids.  Blood Transfusion Information WHAT IS A BLOOD TRANSFUSION? A transfusion is the replacement of blood or some of its parts. Blood is made up of multiple cells which provide different functions.  Red blood cells carry oxygen and are used for blood loss replacement.  White blood cells fight against infection.  Platelets control bleeding.  Plasma helps clot blood.  Other blood products are available for specialized needs, such as hemophilia or other clotting disorders. BEFORE THE TRANSFUSION  Who gives blood for transfusions?   You may be able to donate blood to be used at a later date on yourself (autologous donation).  Relatives can be asked to donate blood. This is generally not any safer than if you have received blood from a stranger. The same precautions are taken to ensure safety when a relative's blood is donated.  Healthy volunteers who are fully evaluated to make sure their blood is safe. This is blood bank blood. Transfusion therapy is the safest it has ever been in the practice of medicine. Before blood is taken from a donor, a complete history is taken to make sure that person has no history of diseases nor engages in risky social behavior (examples are intravenous drug use or sexual activity with multiple partners). The donor's travel history is screened to minimize risk of transmitting infections, such as malaria. The donated blood is tested for signs of infectious diseases, such as HIV and hepatitis. The blood is then tested to be sure it is compatible with you in order to minimize the chance of a  transfusion reaction. If you or a relative donates blood, this is often done in anticipation of surgery and is not appropriate for emergency situations. It takes many days to process the donated blood. RISKS AND COMPLICATIONS Although transfusion therapy is very safe and saves many lives, the main dangers of transfusion include:   Getting an infectious disease.  Developing a transfusion reaction. This is an allergic reaction to something in the blood you were given. Every precaution is taken to prevent this. The decision to have a blood transfusion has been considered carefully by your caregiver before blood is given. Blood is not given unless the benefits outweigh the risks.

## 2018-12-26 ENCOUNTER — Other Ambulatory Visit: Payer: Self-pay | Admitting: Gynecologic Oncology

## 2018-12-26 DIAGNOSIS — D3A098 Benign carcinoid tumors of other sites: Secondary | ICD-10-CM

## 2018-12-26 DIAGNOSIS — N83201 Unspecified ovarian cyst, right side: Secondary | ICD-10-CM

## 2018-12-31 ENCOUNTER — Encounter: Payer: Self-pay | Admitting: Obstetrics & Gynecology

## 2019-01-08 ENCOUNTER — Telehealth: Payer: Self-pay | Admitting: Obstetrics & Gynecology

## 2019-01-08 ENCOUNTER — Encounter: Payer: Self-pay | Admitting: Obstetrics & Gynecology

## 2019-01-08 DIAGNOSIS — Z30432 Encounter for removal of intrauterine contraceptive device: Secondary | ICD-10-CM

## 2019-01-08 NOTE — Telephone Encounter (Signed)
Routing to Dr. Miller to review and advise.  

## 2019-01-08 NOTE — Telephone Encounter (Signed)
Message   Hi again Dr Sabra Heck. I wanted to touch base about my IUD. I started bleeding again this week. Day 3 currently. Dr Denman George said whatever was on my ovary shouldn't be causing bleeding, thus it must be hormonal was her take. Since you won't be performing my surgery I was wondering how you thought we should proceed with the IUD? I would like to take it out and see what the heck my body is doing and if it's actually kicked into gear. So should we remove that before my surgery which is scheduled for June 18? Or...?   Thanks!

## 2019-01-09 ENCOUNTER — Telehealth: Payer: Self-pay | Admitting: *Deleted

## 2019-01-09 NOTE — Telephone Encounter (Signed)
Patient returning call to [place], [place].

## 2019-01-09 NOTE — Telephone Encounter (Signed)
Left message to call Oriyah Lamphear, RN at [facility] [phone].   

## 2019-01-09 NOTE — Telephone Encounter (Signed)
Left message to call Marcina Kinnison, RN at [facility] [phone].   

## 2019-01-09 NOTE — Telephone Encounter (Signed)
Patient called and left a message to call her back. She had some specific questions regarding COVID and her procedure. Message forwarded to [facility] nurse navigator

## 2019-01-09 NOTE — Telephone Encounter (Signed)
Spoke with patient. Patient request to proceed with IUD removal before her surgery on 6/18. IUD removal scheduled for 6/9 at 4:30pm with Dr. Sabra Heck. Order placed for precert. Patient verbalizes understanding.   Routing to provider for final review. Patient is agreeable to disposition. Will close encounter.  Cc: Lerry Liner

## 2019-01-09 NOTE — Telephone Encounter (Signed)
Either way is ok.  It's ok to do it before or after the surgery.  I could see the string at the last visit, so removal should not difficult.  Ok to schedule removal if she desires.

## 2019-01-14 ENCOUNTER — Telehealth: Payer: Self-pay | Admitting: Oncology

## 2019-01-14 ENCOUNTER — Ambulatory Visit: Payer: Self-pay

## 2019-01-14 ENCOUNTER — Other Ambulatory Visit: Payer: Self-pay

## 2019-01-14 ENCOUNTER — Ambulatory Visit: Payer: Medicaid Other | Admitting: Obstetrics & Gynecology

## 2019-01-14 DIAGNOSIS — Z30432 Encounter for removal of intrauterine contraceptive device: Secondary | ICD-10-CM

## 2019-01-14 HISTORY — PX: IUD REMOVAL: SHX5392

## 2019-01-14 NOTE — Telephone Encounter (Signed)
Called [name] regarding her quarantine question before surgery.  Left her a message advising her that we will check with Dr. Denman George and call her tomorrow.

## 2019-01-14 NOTE — Telephone Encounter (Signed)
[name] called back and said they were able to reschedule the class to 6/23 so she will be able to quarantine before surgery.

## 2019-01-15 NOTE — Patient Instructions (Addendum)
YOU ARE REQUIRED TO BE TESTED FOR COVID-19 PRIOR TO YOUR SURGERY . YOUR TEST MUST BE COMPLETED ON May 2020. TESTING IS LOCATED AT [facility] FROM 9:00AM - 3:00PM. FAILURE TO COMPLETE TESTING MAY RESULT IN CANCELLATION  OF YOUR SURGERY.                  [name]     Your procedure is scheduled on: 01-23-2019   Report to [facility] Main  Entrance    Report to admitting at 5:30 AM     Call this number if you have problems the morning of surgery [phone]      Remember: Eat a light diet the day before surgery.  Examples including soups, broths, toast, yogurt, mashed potatoes.  Things to avoid include carbonated beverages (fizzy beverages), raw fruits and raw vegetables, or beans. If your bowels are filled with gas, your surgeon will have difficulty visualizing your pelvic organs which increases your surgical risks.   Do not eat food  :After Midnight. CLEAR LIQUIDS FROM MIDNIGHT UNTIL 430 AM. NOTHING BY MOUTH AFTER 430 AM.     BRUSH YOUR TEETH MORNING OF SURGERY AND RINSE YOUR MOUTH OUT, NO CHEWING GUM CANDY OR MINTS.     CLEAR LIQUID DIET   Foods Allowed                                                                     Foods Excluded  Coffee and tea, regular and decaf                             liquids that you cannot  Plain Jell-O in any flavor                                             see through such as: Fruit ices (not with fruit pulp)                                     milk, soups, orange juice  Iced Popsicles                                    All solid food                                    Cranberry, grape and apple juices Sports drinks like Gatorade Lightly seasoned clear broth or consume(fat free) Sugar, honey syrup  Sample Menu Breakfast                                Lunch  Supper Cranberry juice                    Beef broth                            Chicken  broth Jell-O                                     Grape juice                           Apple juice Coffee or tea                        Jell-O                                      Popsicle                                                Coffee or tea                        Coffee or tea  _____________________________________________________________________       Take these medicines the morning of surgery with A SIP OF WATER: FLUOXETINE (PROZAC)                                You may not have any metal on your body including hair pins and              piercings  Do not wear jewelry, make-up, lotions, powders or perfumes, deodorant             Do not wear nail polish.  Do not shave  48 hours prior to surgery.               Do not bring valuables to the hospital. [place].  Contacts, dentures or bridgework may not be worn into surgery.  Leave suitcase in the car. After surgery it may be brought to your room.     Patients discharged the day of surgery will not be allowed to drive home. IF YOU ARE HAVING SURGERY AND GOING HOME THE SAME DAY, YOU MUST HAVE AN ADULT TO DRIVE YOU HOME AND BE WITH YOU FOR 24 HOURS. YOU MAY GO HOME BY TAXI OR UBER OR ORTHERWISE, BUT AN ADULT MUST ACCOMPANY YOU HOME AND STAY WITH YOU FOR 24 HOURS.  Name and phone number of your driver:  Special Instructions: N/A              Please read over the following fact sheets you were given: _____________________________________________________________________             [facility] - Preparing for Surgery Before surgery, you can play an important role.  Because skin is not sterile, your skin needs to be as free of germs as possible.  You can reduce the  number of germs on your skin by washing with CHG (chlorahexidine gluconate) soap before surgery.  CHG is an antiseptic cleaner which kills germs and bonds with the skin to continue killing germs even after  washing. Please DO NOT use if you have an allergy to CHG or antibacterial soaps.  If your skin becomes reddened/irritated stop using the CHG and inform your nurse when you arrive at Short Stay. Do not shave (including legs and underarms) for at least 48 hours prior to the first CHG shower.  You may shave your face/neck. Please follow these instructions carefully:  1.  Shower with CHG Soap the night before surgery and the  morning of Surgery.  2.  If you choose to wash your hair, wash your hair first as usual with your  normal  shampoo.  3.  After you shampoo, rinse your hair and body thoroughly to remove the  shampoo.                           4.  Use CHG as you would any other liquid soap.  You can apply chg directly  to the skin and wash                       Gently with a scrungie or clean washcloth.  5.  Apply the CHG Soap to your body ONLY FROM THE NECK DOWN.   Do not use on face/ open                           Wound or open sores. Avoid contact with eyes, ears mouth and genitals (private parts).                       Wash face,  Genitals (private parts) with your normal soap.             6.  Wash thoroughly, paying special attention to the area where your surgery  will be performed.  7.  Thoroughly rinse your body with warm water from the neck down.  8.  DO NOT shower/wash with your normal soap after using and rinsing off  the CHG Soap.                9.  Pat yourself dry with a clean towel.            10.  Wear clean pajamas.            11.  Place clean sheets on your bed the night of your first shower and do not  sleep with pets. Day of Surgery : Do not apply any lotions/deodorants the morning of surgery.  Please wear clean clothes to the hospital/surgery center.  FAILURE TO FOLLOW THESE INSTRUCTIONS MAY RESULT IN THE CANCELLATION OF YOUR SURGERY PATIENT SIGNATURE_________________________________  NURSE  SIGNATURE__________________________________  ________________________________________________________________________   Adam Phenix  An incentive spirometer is a tool that can help keep your lungs clear and active. This tool measures how well you are filling your lungs with each breath. Taking long deep breaths may help reverse or decrease the chance of developing breathing (pulmonary) problems (especially infection) following:  A long period of time when you are unable to move or be active. BEFORE THE PROCEDURE   If the spirometer includes an indicator to show your best effort, your nurse or respiratory therapist will set it to a  desired goal.  If possible, sit up straight or lean slightly forward. Try not to slouch.  Hold the incentive spirometer in an upright position. INSTRUCTIONS FOR USE  1. Sit on the edge of your bed if possible, or sit up as far as you can in bed or on a chair. 2. Hold the incentive spirometer in an upright position. 3. Breathe out normally. 4. Place the mouthpiece in your mouth and seal your lips tightly around it. 5. Breathe in slowly and as deeply as possible, raising the piston or the ball toward the top of the column. 6. Hold your breath for 3-5 seconds or for as long as possible. Allow the piston or ball to fall to the bottom of the column. 7. Remove the mouthpiece from your mouth and breathe out normally. 8. Rest for a few seconds and repeat Steps 1 through 7 at least 10 times every 1-2 hours when you are awake. Take your time and take a few normal breaths between deep breaths. 9. The spirometer may include an indicator to show your best effort. Use the indicator as a goal to work toward during each repetition. 10. After each set of 10 deep breaths, practice coughing to be sure your lungs are clear. If you have an incision (the cut made at the time of surgery), support your incision when coughing by placing a pillow or rolled up towels firmly  against it. Once you are able to get out of bed, walk around indoors and cough well. You may stop using the incentive spirometer when instructed by your caregiver.  RISKS AND COMPLICATIONS  Take your time so you do not get dizzy or light-headed.  If you are in pain, you may need to take or ask for pain medication before doing incentive spirometry. It is harder to take a deep breath if you are having pain. AFTER USE  Rest and breathe slowly and easily.  It can be helpful to keep track of a log of your progress. Your caregiver can provide you with a simple table to help with this. If you are using the spirometer at home, follow these instructions: [place] IF:   You are having difficultly using the spirometer.  You have trouble using the spirometer as often as instructed.  Your pain medication is not giving enough relief while using the spirometer.  You develop fever of 100.5 F (38.1 C) or higher. SEEK IMMEDIATE MEDICAL CARE IF:   You cough up bloody sputum that had not been present before.  You develop fever of 102 F (38.9 C) or greater.  You develop worsening pain at or near the incision site. MAKE SURE YOU:   Understand these instructions.  Will watch your condition.  Will get help right away if you are not doing well or get worse. Document Released: 12/04/2006 Document Revised: 10/16/2011 Document Reviewed: 02/04/2007 ExitCare Patient Information 2014 ExitCare, [place].   ________________________________________________________________________  WHAT IS A BLOOD TRANSFUSION? Blood Transfusion Information  A transfusion is the replacement of blood or some of its parts. Blood is made up of multiple cells which provide different functions.  Red blood cells carry oxygen and are used for blood loss replacement.  White blood cells fight against infection.  Platelets control bleeding.  Plasma helps clot blood.  Other blood products are available for  specialized needs, such as hemophilia or other clotting disorders. BEFORE THE TRANSFUSION  Who gives blood for transfusions?   Healthy volunteers who are fully evaluated to make sure  their blood is safe. This is blood bank blood. Transfusion therapy is the safest it has ever been in the practice of medicine. Before blood is taken from a donor, a complete history is taken to make sure that person has no history of diseases nor engages in risky social behavior (examples are intravenous drug use or sexual activity with multiple partners). The donor's travel history is screened to minimize risk of transmitting infections, such as malaria. The donated blood is tested for signs of infectious diseases, such as HIV and hepatitis. The blood is then tested to be sure it is compatible with you in order to minimize the chance of a transfusion reaction. If you or a relative donates blood, this is often done in anticipation of surgery and is not appropriate for emergency situations. It takes many days to process the donated blood. RISKS AND COMPLICATIONS Although transfusion therapy is very safe and saves many lives, the main dangers of transfusion include:   Getting an infectious disease.  Developing a transfusion reaction. This is an allergic reaction to something in the blood you were given. Every precaution is taken to prevent this. The decision to have a blood transfusion has been considered carefully by your caregiver before blood is given. Blood is not given unless the benefits outweigh the risks. AFTER THE TRANSFUSION  Right after receiving a blood transfusion, you will usually feel much better and more energetic. This is especially true if your red blood cells have gotten low (anemic). The transfusion raises the level of the red blood cells which carry oxygen, and this usually causes an energy increase.  The nurse administering the transfusion will monitor you carefully for complications. HOME CARE  INSTRUCTIONS  No special instructions are needed after a transfusion. You may find your energy is better. Speak with your caregiver about any limitations on activity for underlying diseases you may have. SEEK MEDICAL CARE IF:   Your condition is not improving after your transfusion.  You develop redness or irritation at the intravenous (IV) site. SEEK IMMEDIATE MEDICAL CARE IF:  Any of the following symptoms occur over the next 12 hours:  Shaking chills.  You have a temperature by mouth above 102 F (38.9 C), not controlled by medicine.  Chest, back, or muscle pain.  People around you feel you are not acting correctly or are confused.  Shortness of breath or difficulty breathing.  Dizziness and fainting.  You get a rash or develop hives.  You have a decrease in urine output.  Your urine turns a dark color or changes to pink, red, or brown. Any of the following symptoms occur over the next 10 days:  You have a temperature by mouth above 102 F (38.9 C), not controlled by medicine.  Shortness of breath.  Weakness after normal activity.  The white part of the eye turns yellow (jaundice).  You have a decrease in the amount of urine or are urinating less often.  Your urine turns a dark color or changes to pink, red, or brown. Document Released: 07/21/2000 Document Revised: 10/16/2011 Document Reviewed: 03/09/2008 [employer] Patient Information 2014 [place], [place].  _______________________________________________________________________

## 2019-01-16 ENCOUNTER — Telehealth: Payer: Self-pay | Admitting: *Deleted

## 2019-01-16 ENCOUNTER — Other Ambulatory Visit: Payer: Self-pay

## 2019-01-16 ENCOUNTER — Encounter (HOSPITAL_COMMUNITY): Payer: Self-pay

## 2019-01-16 ENCOUNTER — Encounter (HOSPITAL_COMMUNITY)
Admission: RE | Admit: 2019-01-16 | Discharge: 2019-01-16 | Disposition: A | Discharge status: Home / Self Care | Payer: Medicaid Other | Source: Ambulatory Visit | Attending: Gynecologic Oncology | Admitting: Gynecologic Oncology

## 2019-01-16 ENCOUNTER — Encounter: Payer: Self-pay | Admitting: Obstetrics & Gynecology

## 2019-01-16 DIAGNOSIS — D3A098 Benign carcinoid tumors of other sites: Secondary | ICD-10-CM | POA: Diagnosis not present

## 2019-01-16 DIAGNOSIS — Z01812 Encounter for preprocedural laboratory examination: Secondary | ICD-10-CM | POA: Diagnosis present

## 2019-01-16 DIAGNOSIS — D271 Benign neoplasm of left ovary: Secondary | ICD-10-CM | POA: Diagnosis not present

## 2019-01-16 HISTORY — DX: Unspecified ovarian cyst, unspecified side: N83.209

## 2019-01-16 LAB — URINALYSIS, ROUTINE W REFLEX MICROSCOPIC
Bacteria, UA: NONE SEEN
Bilirubin Urine: NEGATIVE
Glucose, UA: NEGATIVE mg/dL
Ketones, ur: NEGATIVE mg/dL
Leukocytes,Ua: NEGATIVE
Nitrite: NEGATIVE
Protein, ur: NEGATIVE mg/dL
RBC / HPF: >50 RBC/hpf — ABNORMAL HIGH (ref 0–5)
Specific Gravity, Urine: 1.021 (ref 1.005–1.030)
pH: 5 (ref 5.0–8.0)

## 2019-01-16 LAB — CBC
HCT: 43.9 % (ref 36.0–46.0)
Hemoglobin: 13.6 g/dL (ref 12.0–15.0)
MCH: 26.9 pg (ref 26.0–34.0)
MCHC: 31 g/dL (ref 30.0–36.0)
MCV: 86.8 fL (ref 80.0–100.0)
Platelets: 301 10*3/uL (ref 150–400)
RBC: 5.06 MIL/uL (ref 3.87–5.11)
RDW: 13.2 % (ref 11.5–15.5)
WBC: 16 10*3/uL — ABNORMAL HIGH (ref 4.0–10.5)
nRBC: 0 % (ref 0.0–0.2)

## 2019-01-16 LAB — HEMOGLOBIN A1C
Hgb A1c MFr Bld: 5.7 % — ABNORMAL HIGH (ref 4.8–5.6)
Mean Plasma Glucose: 116.89 mg/dL

## 2019-01-16 LAB — COMPREHENSIVE METABOLIC PANEL
ALT: 20 U/L (ref 0–44)
AST: 16 U/L (ref 15–41)
Albumin: 4.5 g/dL (ref 3.5–5.0)
Alkaline Phosphatase: 60 U/L (ref 38–126)
Anion gap: 10 (ref 5–15)
BUN: 12 mg/dL (ref 6–20)
CO2: 27 mmol/L (ref 22–32)
Calcium: 9.8 mg/dL (ref 8.9–10.3)
Chloride: 103 mmol/L (ref 98–111)
Creatinine, Ser: 0.62 mg/dL (ref 0.44–1.00)
GFR calc Af Amer: >60 mL/min (ref >60)
GFR calc non Af Amer: >60 mL/min (ref >60)
Glucose, Bld: 123 mg/dL — ABNORMAL HIGH (ref 70–99)
Potassium: 4.1 mmol/L (ref 3.5–5.1)
Sodium: 140 mmol/L (ref 135–145)
Total Bilirubin: 0.6 mg/dL (ref 0.3–1.2)
Total Protein: 8.5 g/dL — ABNORMAL HIGH (ref 6.5–8.1)

## 2019-01-16 LAB — PREGNANCY, URINE: Preg Test, Ur: NEGATIVE

## 2019-01-16 NOTE — Telephone Encounter (Signed)
Agree with recommendations.  Ok to close encounter.

## 2019-01-16 NOTE — Telephone Encounter (Signed)
Patient is bleeding more than normal and cramping after iud removal.

## 2019-01-16 NOTE — Progress Notes (Signed)
33 y.o. G106P2002 Married Caucasian female presents for removal of Mirena IUD.  She has bleeding about every two months and this lasts for up to 10 days.  It is not heavy.  She really feels like she is cycling on her own and would like to see if this is really the case.  She does have surgery scheduled next week with Dr. Denman George for removal of right ovary due to enlarging solid/cystic lesion.  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    With IUD removal, is aware she will not have contraception.  Use ovulation in duction agent for prior pregnancies.  Also, knows if she does not cycle every 90 days, she needs to call for provera challenge.  Voices clear understanding of this and understands endometrial hyperplasia, cancer risks with anovulation and prolonged time between cycles.    LMP:  No LMP recorded. (Menstrual status: IUD).  Patient Active Problem List   Diagnosis Date Noted  . Right ovarian cyst 09/26/2018  . Anxiety 03/06/2015  . 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 Stomach spasm 03/06/2015  . Obesity, Class III, BMI 40-49.9 (morbid obesity) ([place]) 02/11/2014  . 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 Amenorrhea 11/19/2013  . PCOS (polycystic ovarian syndrome) 09/30/2013  . Migraines   . Depression    Past Medical History:  Diagnosis Date  . Anxiety   . Depression   . Infertility, female    clomid  . Migraines   . Ovarian carcinoma ([place])   . Ovarian cyst    dermoid   . PCOS (polycystic ovarian syndrome)   . Stomach problems    (migraines or spasms) since age 70    Current Outpatient Medications on File Prior to Visit  Medication Sig Dispense Refill  . FLUoxetine (PROZAC) 20 MG capsule Take 1 capsule (20 mg total) by mouth daily. 90 capsule 4  . ibuprofen (ADVIL) 200 MG tablet Take 600 mg by mouth every 6 (six) hours as needed for headache or moderate pain.    . Nutritional Supplements (JUICE  PLUS FIBRE PO) Take 4 each by mouth daily.    Marland Kitchen oxyCODONE (OXY IR/ROXICODONE) 5 MG immediate release tablet Take 1 tablet (5 mg total) by mouth every 4 (four) hours as needed for severe pain. For AFTER surgery, do not take and drive (Patient not taking: Reported on 01/14/2019) 15 tablet 0  . senna-docusate (SENOKOT-S) 8.6-50 MG tablet Take 2 tablets by mouth at bedtime. For AFTER surgery, do not take if having loose stools (Patient not taking: Reported on 01/14/2019) 30 tablet 0   No current facility-administered medications on file prior to visit.    Augmentin [amoxicillin-pot clavulanate] and Penicillins  Review of Systems  Constitutional: Negative.   Genitourinary: Negative.    Vitals:   01/14/19 1625  BP: 120/82  Pulse: 88  Temp: 98.1 F (36.7 C)  TempSrc: Temporal  Weight: 256 lb (116.1 kg)  Height: 5\' 4"  (1.626 m)    Gen:  WNWF healthy female NAD Abdomen: soft, non-tender Groin:  no inguinal nodes palpated  Pelvic exam: Vulva:  normal female genitalia Vagina:  normal vagina Cervix:  Non-tender, Negative CMT, no lesions or redness. Uterus:  normal shape, position and consistency   Procedure:  IUD string noted and grasped with ringed forcep.  With one pull, IUD removed easily.  Pt had minimal cramping and tolerated this well.  A: Removal of Mirena IUD  Does not desire other contraception H/O oligo/anovulation  P:  Will call if no cycle for >90 days for provera rx Knows to call if desires replacement of IUD in the future

## 2019-01-16 NOTE — Telephone Encounter (Signed)
Spoke with patient. IUD removed on 01/14/19. Has changed full panty liner 3 times since this morning. Experiencing menses like cramps, varies in intensity 1-5/10 on pain scale, currently 1. Patient states she was only expecting spotting, asking if ok? Denies any other symptoms. Unable to take motrin, "will take tylenol if needed, don't think it will help with cramps", scheduled for surgery on 01/23/19 with Dr. Denman George.   Advised patient may have some bleeding after removal of IUD. Continue to monitor, return call to office if bleeding becomes heavy, changing saturated pad or tampon q1-2 hrs or if any new symptoms develop. Advised Dr. Sabra Heck will review, our office will return call if any additional recommendations. Patient agreeable.   Dr. Sabra Heck -please review and advise if any additional recommendations.

## 2019-01-17 LAB — ABO/RH: ABO/RH(D): A POS

## 2019-01-20 ENCOUNTER — Other Ambulatory Visit (HOSPITAL_COMMUNITY)
Admission: RE | Admit: 2019-01-20 | Discharge: 2019-01-20 | Disposition: A | Discharge status: Home / Self Care | Payer: Medicaid Other | Source: Ambulatory Visit | Attending: Gynecologic Oncology | Admitting: Gynecologic Oncology

## 2019-01-20 DIAGNOSIS — Z1159 Encounter for screening for other viral diseases: Secondary | ICD-10-CM | POA: Diagnosis not present

## 2019-01-22 LAB — NOVEL CORONAVIRUS, NAA (HOSP ORDER, SEND-OUT TO REF LAB; TAT 18-24 HRS): SARS-CoV-2, NAA: NOT DETECTED

## 2019-01-22 NOTE — Progress Notes (Signed)
SPOKE W/  Pt via phone      SCREENING SYMPTOMS OF COVID 19:   COUGH-- no  RUNNY NOSE--- no  SORE THROAT--- no  NASAL CONGESTION---- no  SNEEZING---- no  SHORTNESS OF BREATH--- no  DIFFICULTY BREATHING--- no  TEMP >100.0 ----- no  UNEXPLAINED BODY ACHES------ no  CHILLS -------- no  HEADACHES --------- yes, not abnormal for patient.   LOSS OF SMELL/ TASTE -------- no    HAVE YOU OR ANY FAMILY MEMBER TRAVELLED PAST 14 DAYS OUT OF THE   COUNTY---no STATE----no COUNTRY----no  HAVE YOU OR ANY FAMILY MEMBER BEEN EXPOSED TO ANYONE WITH COVID 19? no

## 2019-01-22 NOTE — Anesthesia Preprocedure Evaluation (Addendum)
Anesthesia Evaluation  Patient identified by MRN, date of birth, ID band Patient awake    Reviewed: Allergy & Precautions, NPO status , Patient's Chart, lab work & pertinent test results  History of Anesthesia Complications Negative for: history of anesthetic complications  Airway Mallampati: II  TM Distance: >3 FB Neck ROM: Full    Dental  (+) Dental Advisory Given, Teeth Intact   Pulmonary neg pulmonary ROS,    breath sounds clear to auscultation       Cardiovascular (-) anginanegative cardio ROS   Rhythm:Regular Rate:Normal     Neuro/Psych  Headaches, PSYCHIATRIC DISORDERS Anxiety Depression    GI/Hepatic negative GI ROS, Neg liver ROS,   Endo/Other  Morbid obesity  Renal/GU negative Renal ROS     Musculoskeletal negative musculoskeletal ROS (+)   Abdominal (+) + obese,   Peds  Hematology negative hematology ROS (+)   Anesthesia Other Findings Anaphylaxis to PCN/amoxicillin  Reproductive/Obstetrics  PCOS Ovarian carcinoma                             Anesthesia Physical Anesthesia Plan  ASA: III  Anesthesia Plan: General   Post-op Pain Management:    Induction: Intravenous  PONV Risk Score and Plan: 4 or greater and Treatment may vary due to age or medical condition, Ondansetron, Midazolam, Scopolamine patch - Pre-op and Dexamethasone  Airway Management Planned: Oral ETT  Additional Equipment: None  Intra-op Plan:   Post-operative Plan: Extubation in OR  Informed Consent: I have reviewed the patients History and Physical, chart, labs and discussed the procedure including the risks, benefits and alternatives for the proposed anesthesia with the patient or authorized representative who has indicated his/her understanding and acceptance.     Dental advisory given  Plan Discussed with: CRNA and Anesthesiologist  Anesthesia Plan Comments:        Anesthesia Quick  Evaluation

## 2019-01-23 ENCOUNTER — Encounter (HOSPITAL_COMMUNITY): Payer: Self-pay | Admitting: *Deleted

## 2019-01-23 ENCOUNTER — Encounter (HOSPITAL_COMMUNITY)
Admission: RE | Disposition: A | Discharge status: Home / Self Care | Payer: Self-pay | Source: Home / Self Care | Attending: Gynecologic Oncology

## 2019-01-23 ENCOUNTER — Ambulatory Visit (HOSPITAL_COMMUNITY)
Admission: RE | Admit: 2019-01-23 | Discharge: 2019-01-23 | Disposition: A | Discharge status: Home / Self Care | Payer: Medicaid Other | Attending: Gynecologic Oncology | Admitting: Gynecologic Oncology

## 2019-01-23 ENCOUNTER — Ambulatory Visit (HOSPITAL_COMMUNITY): Payer: Medicaid Other | Admitting: Certified Registered"

## 2019-01-23 ENCOUNTER — Ambulatory Visit (HOSPITAL_COMMUNITY): Payer: Medicaid Other | Admitting: Physician Assistant

## 2019-01-23 DIAGNOSIS — N83201 Unspecified ovarian cyst, right side: Secondary | ICD-10-CM | POA: Diagnosis present

## 2019-01-23 DIAGNOSIS — F329 Major depressive disorder, single episode, unspecified: Secondary | ICD-10-CM | POA: Insufficient documentation

## 2019-01-23 DIAGNOSIS — D270 Benign neoplasm of right ovary: Secondary | ICD-10-CM | POA: Diagnosis not present

## 2019-01-23 DIAGNOSIS — Z6841 Body Mass Index (BMI) 40.0 and over, adult: Secondary | ICD-10-CM | POA: Diagnosis not present

## 2019-01-23 DIAGNOSIS — N838 Other noninflammatory disorders of ovary, fallopian tube and broad ligament: Secondary | ICD-10-CM

## 2019-01-23 DIAGNOSIS — D271 Benign neoplasm of left ovary: Secondary | ICD-10-CM | POA: Insufficient documentation

## 2019-01-23 DIAGNOSIS — D3A098 Benign carcinoid tumors of other sites: Secondary | ICD-10-CM

## 2019-01-23 DIAGNOSIS — E66813 Obesity, class 3: Secondary | ICD-10-CM | POA: Diagnosis present

## 2019-01-23 HISTORY — PX: ROBOTIC ASSISTED SALPINGO OOPHERECTOMY: SHX6082

## 2019-01-23 HISTORY — PX: ROBOTIC ASSISTED LAPAROSCOPIC OVARIAN CYSTECTOMY: SHX6081

## 2019-01-23 LAB — TYPE AND SCREEN
ABO/RH(D): A POS
Antibody Screen: NEGATIVE

## 2019-01-23 LAB — PREGNANCY, URINE: Preg Test, Ur: NEGATIVE

## 2019-01-23 SURGERY — SALPINGO-OOPHORECTOMY, ROBOT-ASSISTED
Anesthesia: General | Laterality: Right

## 2019-01-23 MED ORDER — PROPOFOL 10 MG/ML IV BOLUS
INTRAVENOUS | Status: DC | PRN
  Administered 2019-01-23: 200 mg via INTRAVENOUS

## 2019-01-23 MED ORDER — MIDAZOLAM HCL 2 MG/2ML IJ SOLN
INTRAMUSCULAR | Status: DC | PRN
  Administered 2019-01-23: 2 mg via INTRAVENOUS

## 2019-01-23 MED ORDER — SUGAMMADEX SODIUM 500 MG/5ML IV SOLN
INTRAVENOUS | Status: AC
  Filled 2019-01-23: qty 5

## 2019-01-23 MED ORDER — DEXAMETHASONE SODIUM PHOSPHATE 4 MG/ML IJ SOLN: 4.0000 mg | INTRAMUSCULAR | Status: DC

## 2019-01-23 MED ORDER — CLINDAMYCIN PHOSPHATE 900 MG/50ML IV SOLN
900.0000 mg | INTRAVENOUS | Status: AC
  Administered 2019-01-23: 900 mg via INTRAVENOUS
  Filled 2019-01-23: qty 50

## 2019-01-23 MED ORDER — GABAPENTIN 300 MG PO CAPS
300.0000 mg | ORAL_CAPSULE | ORAL | Status: AC
  Administered 2019-01-23: 06:00:00 300 mg via ORAL
  Filled 2019-01-23: qty 1

## 2019-01-23 MED ORDER — LACTATED RINGERS IV SOLN: INTRAVENOUS | Status: DC

## 2019-01-23 MED ORDER — LIDOCAINE 2% (20 MG/ML) 5 ML SYRINGE
INTRAMUSCULAR | Status: AC
  Filled 2019-01-23: qty 5

## 2019-01-23 MED ORDER — DEXAMETHASONE SODIUM PHOSPHATE 10 MG/ML IJ SOLN
INTRAMUSCULAR | Status: DC | PRN
  Administered 2019-01-23: 8 mg via INTRAVENOUS

## 2019-01-23 MED ORDER — PROPOFOL 10 MG/ML IV BOLUS
INTRAVENOUS | Status: AC
  Filled 2019-01-23: qty 20

## 2019-01-23 MED ORDER — CIPROFLOXACIN IN D5W 400 MG/200ML IV SOLN
400.0000 mg | INTRAVENOUS | Status: AC
  Administered 2019-01-23: 400 mg via INTRAVENOUS
  Filled 2019-01-23: qty 200

## 2019-01-23 MED ORDER — LACTATED RINGERS IV SOLN
INTRAVENOUS | Status: DC | PRN
  Administered 2019-01-23: 08:00:00 via INTRAVENOUS

## 2019-01-23 MED ORDER — OXYCODONE HCL 5 MG PO TABS: 5.0000 mg | ORAL_TABLET | Freq: Once | ORAL | Status: DC | PRN

## 2019-01-23 MED ORDER — BUPIVACAINE HCL 0.25 % IJ SOLN
INTRAMUSCULAR | Status: DC | PRN
  Administered 2019-01-23: 18 mL

## 2019-01-23 MED ORDER — FENTANYL CITRATE (PF) 100 MCG/2ML IJ SOLN
INTRAMUSCULAR | Status: AC
  Filled 2019-01-23: qty 2

## 2019-01-23 MED ORDER — BUPIVACAINE HCL (PF) 0.25 % IJ SOLN
INTRAMUSCULAR | Status: AC
  Filled 2019-01-23: qty 30

## 2019-01-23 MED ORDER — PROMETHAZINE HCL 25 MG/ML IJ SOLN
6.2500 mg | INTRAMUSCULAR | Status: DC | PRN
  Administered 2019-01-23: 12.5 mg via INTRAVENOUS

## 2019-01-23 MED ORDER — ONDANSETRON HCL 4 MG/2ML IJ SOLN
INTRAMUSCULAR | Status: AC
  Filled 2019-01-23: qty 2

## 2019-01-23 MED ORDER — OXYCODONE HCL 5 MG PO TABS: 5.0000 mg | ORAL_TABLET | ORAL | Status: DC | PRN

## 2019-01-23 MED ORDER — LIDOCAINE HCL 2 % IJ SOLN
INTRAMUSCULAR | Status: AC
  Filled 2019-01-23: qty 20

## 2019-01-23 MED ORDER — MIDAZOLAM HCL 2 MG/2ML IJ SOLN
INTRAMUSCULAR | Status: AC
  Filled 2019-01-23: qty 2

## 2019-01-23 MED ORDER — FENTANYL CITRATE (PF) 250 MCG/5ML IJ SOLN
INTRAMUSCULAR | Status: DC | PRN
  Administered 2019-01-23 (×2): 50 ug via INTRAVENOUS

## 2019-01-23 MED ORDER — LIDOCAINE 2% (20 MG/ML) 5 ML SYRINGE
INTRAMUSCULAR | Status: DC | PRN
  Administered 2019-01-23: 50 mg via INTRAVENOUS

## 2019-01-23 MED ORDER — ONDANSETRON HCL 4 MG/2ML IJ SOLN
INTRAMUSCULAR | Status: DC | PRN
  Administered 2019-01-23: 4 mg via INTRAVENOUS

## 2019-01-23 MED ORDER — ACETAMINOPHEN 500 MG PO TABS
1000.0000 mg | ORAL_TABLET | ORAL | Status: AC
  Administered 2019-01-23: 1000 mg via ORAL
  Filled 2019-01-23: qty 2

## 2019-01-23 MED ORDER — KETAMINE HCL 10 MG/ML IJ SOLN
INTRAMUSCULAR | Status: AC
  Filled 2019-01-23: qty 1

## 2019-01-23 MED ORDER — ENOXAPARIN SODIUM 40 MG/0.4ML ~~LOC~~ SOLN
40.0000 mg | SUBCUTANEOUS | Status: AC
  Administered 2019-01-23: 06:00:00 40 mg via SUBCUTANEOUS
  Filled 2019-01-23: qty 0.4

## 2019-01-23 MED ORDER — LACTATED RINGERS IR SOLN
Status: DC | PRN
  Administered 2019-01-23: 1000 mL

## 2019-01-23 MED ORDER — ROCURONIUM BROMIDE 10 MG/ML (PF) SYRINGE
PREFILLED_SYRINGE | INTRAVENOUS | Status: AC
  Filled 2019-01-23: qty 10

## 2019-01-23 MED ORDER — SCOPOLAMINE 1 MG/3DAYS TD PT72
1.0000 | MEDICATED_PATCH | TRANSDERMAL | Status: DC
  Administered 2019-01-23: 1.5 mg via TRANSDERMAL
  Filled 2019-01-23: qty 1

## 2019-01-23 MED ORDER — EPHEDRINE 5 MG/ML INJ
INTRAVENOUS | Status: AC
  Filled 2019-01-23: qty 10

## 2019-01-23 MED ORDER — SODIUM CHLORIDE 0.9 % IV SOLN: 250.0000 mL | INTRAVENOUS | Status: DC | PRN

## 2019-01-23 MED ORDER — CELECOXIB 200 MG PO CAPS
400.0000 mg | ORAL_CAPSULE | ORAL | Status: AC
  Administered 2019-01-23: 06:00:00 400 mg via ORAL
  Filled 2019-01-23: qty 2

## 2019-01-23 MED ORDER — ACETAMINOPHEN 325 MG PO TABS: 650.0000 mg | ORAL_TABLET | ORAL | Status: DC | PRN

## 2019-01-23 MED ORDER — SUCCINYLCHOLINE CHLORIDE 200 MG/10ML IV SOSY
PREFILLED_SYRINGE | INTRAVENOUS | Status: DC | PRN
  Administered 2019-01-23: 120 mg via INTRAVENOUS

## 2019-01-23 MED ORDER — SUGAMMADEX SODIUM 200 MG/2ML IV SOLN
INTRAVENOUS | Status: DC | PRN
  Administered 2019-01-23: 240 mg via INTRAVENOUS

## 2019-01-23 MED ORDER — FENTANYL CITRATE (PF) 100 MCG/2ML IJ SOLN: 25.0000 ug | INTRAMUSCULAR | Status: DC | PRN

## 2019-01-23 MED ORDER — PROMETHAZINE HCL 25 MG/ML IJ SOLN
INTRAMUSCULAR | Status: AC
  Filled 2019-01-23: qty 1

## 2019-01-23 MED ORDER — ROCURONIUM BROMIDE 10 MG/ML (PF) SYRINGE
PREFILLED_SYRINGE | INTRAVENOUS | Status: DC | PRN
  Administered 2019-01-23: 50 mg via INTRAVENOUS
  Administered 2019-01-23: 20 mg via INTRAVENOUS

## 2019-01-23 MED ORDER — SUCCINYLCHOLINE CHLORIDE 200 MG/10ML IV SOSY
PREFILLED_SYRINGE | INTRAVENOUS | Status: AC
  Filled 2019-01-23: qty 10

## 2019-01-23 MED ORDER — KETAMINE HCL 10 MG/ML IJ SOLN
INTRAMUSCULAR | Status: DC | PRN
  Administered 2019-01-23: 15 mg via INTRAVENOUS
  Administered 2019-01-23: 10 mg via INTRAVENOUS

## 2019-01-23 MED ORDER — SODIUM CHLORIDE 0.9% FLUSH: 3.0000 mL | Freq: Two times a day (BID) | INTRAVENOUS | Status: DC

## 2019-01-23 MED ORDER — EPHEDRINE SULFATE-NACL 50-0.9 MG/10ML-% IV SOSY
PREFILLED_SYRINGE | INTRAVENOUS | Status: DC | PRN
  Administered 2019-01-23: 10 mg via INTRAVENOUS

## 2019-01-23 MED ORDER — LIDOCAINE 2% (20 MG/ML) 5 ML SYRINGE
INTRAMUSCULAR | Status: DC | PRN
  Administered 2019-01-23: 1.5 mg/kg/h via INTRAVENOUS

## 2019-01-23 MED ORDER — DEXAMETHASONE SODIUM PHOSPHATE 10 MG/ML IJ SOLN
INTRAMUSCULAR | Status: AC
  Filled 2019-01-23: qty 1

## 2019-01-23 MED ORDER — STERILE WATER FOR IRRIGATION IR SOLN
Status: DC | PRN
  Administered 2019-01-23: 1000 mL

## 2019-01-23 MED ORDER — SODIUM CHLORIDE 0.9% FLUSH: 3.0000 mL | INTRAVENOUS | Status: DC | PRN

## 2019-01-23 MED ORDER — ACETAMINOPHEN 650 MG RE SUPP: 650.0000 mg | RECTAL | Status: DC | PRN

## 2019-01-23 MED ORDER — OXYCODONE HCL 5 MG/5ML PO SOLN: 5.0000 mg | Freq: Once | ORAL | Status: DC | PRN

## 2019-01-23 MED ORDER — MORPHINE SULFATE (PF) 4 MG/ML IV SOLN: 2.0000 mg | INTRAVENOUS | Status: DC | PRN

## 2019-01-23 MED ORDER — KETOROLAC TROMETHAMINE 15 MG/ML IJ SOLN: 15.0000 mg | Freq: Four times a day (QID) | INTRAMUSCULAR | Status: DC

## 2019-01-23 SURGICAL SUPPLY — 52 items
APPLICATOR SURGIFLO ENDO (HEMOSTASIS) IMPLANT
BAG LAPAROSCOPIC 12 15 PORT 16 (BASKET) IMPLANT
BAG RETRIEVAL 12/15 (BASKET)
COVER BACK TABLE 60X90IN (DRAPES) ×3 IMPLANT
COVER TIP SHEARS 8 DVNC (MISCELLANEOUS) ×2 IMPLANT
COVER TIP SHEARS 8MM DA VINCI (MISCELLANEOUS) ×1
COVER WAND RF STERILE (DRAPES) IMPLANT
DECANTER SPIKE VIAL GLASS SM (MISCELLANEOUS) ×3 IMPLANT
DERMABOND ADVANCED (GAUZE/BANDAGES/DRESSINGS) ×1
DERMABOND ADVANCED .7 DNX12 (GAUZE/BANDAGES/DRESSINGS) ×2 IMPLANT
DRAIN CHANNEL RND F F (WOUND CARE) IMPLANT
DRAPE ARM DVNC X/XI (DISPOSABLE) ×8 IMPLANT
DRAPE COLUMN DVNC XI (DISPOSABLE) ×2 IMPLANT
DRAPE DA VINCI XI ARM (DISPOSABLE) ×4
DRAPE DA VINCI XI COLUMN (DISPOSABLE) ×1
DRAPE SHEET LG 3/4 BI-LAMINATE (DRAPES) ×3 IMPLANT
DRAPE SURG IRRIG POUCH 19X23 (DRAPES) ×3 IMPLANT
ELECT REM PT RETURN 15FT ADLT (MISCELLANEOUS) ×3 IMPLANT
GAUZE 4X4 16PLY RFD (DISPOSABLE) IMPLANT
GLOVE BIO SURGEON STRL SZ 6 (GLOVE) ×12 IMPLANT
GLOVE BIO SURGEON STRL SZ 6.5 (GLOVE) ×6 IMPLANT
GOWN STRL REUS W/ TWL LRG LVL3 (GOWN DISPOSABLE) ×6 IMPLANT
GOWN STRL REUS W/TWL LRG LVL3 (GOWN DISPOSABLE) ×3
HOLDER FOLEY CATH W/STRAP (MISCELLANEOUS) ×3 IMPLANT
IRRIG SUCT STRYKERFLOW 2 WTIP (MISCELLANEOUS) ×3
IRRIGATION SUCT STRKRFLW 2 WTP (MISCELLANEOUS) ×2 IMPLANT
KIT PROCEDURE DA VINCI SI (MISCELLANEOUS)
KIT PROCEDURE DVNC SI (MISCELLANEOUS) IMPLANT
KIT TURNOVER KIT A (KITS) IMPLANT
MANIPULATOR UTERINE 4.5 ZUMI (MISCELLANEOUS) ×3 IMPLANT
NEEDLE HYPO 22GX1.5 SAFETY (NEEDLE) ×3 IMPLANT
NEEDLE SPNL 18GX3.5 QUINCKE PK (NEEDLE) IMPLANT
OBTURATOR OPTICAL STANDARD 8MM (TROCAR) ×1
OBTURATOR OPTICAL STND 8 DVNC (TROCAR) ×2
OBTURATOR OPTICALSTD 8 DVNC (TROCAR) ×2 IMPLANT
PACK ROBOT GYN CUSTOM WL (TRAY / TRAY PROCEDURE) ×3 IMPLANT
PAD POSITIONING PINK XL (MISCELLANEOUS) ×3 IMPLANT
PORT ACCESS TROCAR AIRSEAL 12 (TROCAR) ×2 IMPLANT
PORT ACCESS TROCAR AIRSEAL 5M (TROCAR) ×1
POUCH SPECIMEN RETRIEVAL 10MM (ENDOMECHANICALS) ×3 IMPLANT
SEAL CANN UNIV 5-8 DVNC XI (MISCELLANEOUS) ×8 IMPLANT
SEAL XI 5MM-8MM UNIVERSAL (MISCELLANEOUS) ×4
SET TRI-LUMEN FLTR TB AIRSEAL (TUBING) ×3 IMPLANT
SURGIFLO W/THROMBIN 8M KIT (HEMOSTASIS) IMPLANT
SUT VIC AB 0 CT1 27 (SUTURE)
SUT VIC AB 0 CT1 27XBRD ANTBC (SUTURE) IMPLANT
SUT VIC AB 4-0 PS2 18 (SUTURE) ×6 IMPLANT
SYR 10ML LL (SYRINGE) IMPLANT
TOWEL OR NON WOVEN STRL DISP B (DISPOSABLE) ×3 IMPLANT
TRAP SPECIMEN MUCOUS 40CC (MISCELLANEOUS) ×3 IMPLANT
TRAY FOLEY MTR SLVR 16FR STAT (SET/KITS/TRAYS/PACK) ×3 IMPLANT
UNDERPAD 30X30 (UNDERPADS AND DIAPERS) ×3 IMPLANT

## 2019-01-23 NOTE — Transfer of Care (Signed)
Immediate Anesthesia Transfer of Care Note  Patient: [name]  Procedure(s) Performed: XI ROBOTIC ASSISTED RIGHT SALPINGO OOPHORECTOMY (Right ) XI ROBOTIC ASSISTED LAPAROSCOPIC LEFT  OVARIAN CYSTECTOMY (Left )  Patient Location: PACU  Anesthesia Type:General  Level of Consciousness: awake, alert  and oriented  Airway & Oxygen Therapy: Patient Spontanous Breathing and Patient connected to face mask oxygen  Post-op Assessment: Report given to RN and Post -op Vital signs reviewed and stable  Post vital signs: Reviewed and stable  Last Vitals:  Vitals Value Taken Time  BP    Temp    Pulse 90 01/23/19 0923  Resp 23 01/23/19 0923  SpO2 97 % 01/23/19 0923  Vitals shown include unvalidated device data.  Last Pain:  Vitals:   01/23/19 0614  TempSrc: Oral  PainSc:       Patients Stated Pain Goal: 5 (62/83/15 1761)  Complications: No apparent anesthesia complications

## 2019-01-23 NOTE — Anesthesia Procedure Notes (Signed)
Procedure Name: Intubation Date/Time: 01/23/2019 7:40 AM Performed by: Niel Hummer, CRNA Pre-anesthesia Checklist: Patient being monitored, Suction available, Emergency Drugs available and Patient identified Patient Re-evaluated:Patient Re-evaluated prior to induction Oxygen Delivery Method: Circle system utilized Preoxygenation: Pre-oxygenation with 100% oxygen Induction Type: IV induction and Rapid sequence Laryngoscope Size: Mac and 4 Grade View: Grade I Tube type: Oral Tube size: 7.0 mm Number of attempts: 1 Airway Equipment and Method: Stylet Placement Confirmation: ETT inserted through vocal cords under direct vision,  positive ETCO2 and breath sounds checked- equal and bilateral Secured at: 22 cm Tube secured with: Tape Dental Injury: Teeth and Oropharynx as per pre-operative assessment

## 2019-01-23 NOTE — Op Note (Signed)
OPERATIVE NOTE  Date: 01/23/19  Preoperative Diagnosis: right ovarian solid mass with a history of carcinoid tumor of the ovary. Left ovarian dermoid cyst  Postoperative Diagnosis:  Bilateral ovarian dermoid cyst  Procedure(s) Performed: Robotic-assisted laparoscopic right salpingo-oophorectomy, left ovarian cystectomy  Surgeon: Everitt Amber, M.D.  Assistant Surgeon: Lahoma Crocker M.D. (an MD assistant was necessary for tissue manipulation, management of robotic instrumentation, retraction and positioning due to the complexity of the case and hospital policies).   Anesthesia: Gen. endotracheal.  Specimens: washings, left ovarian cyst, right tube and ovary.  Estimated Blood Loss: <10 mL. Blood Replacement: None  Complications: none  Indication for Procedure: enlarging right ovarian mass which appeared solid on office ultrasound. Left ovarian 2cm dermoid cyst. History of carcinoid tumor of right ovary.  Operative Findings: enlarged right ovary (6cm) with 2-3cm dermoid cyst on left.  [place]zen pathology was consistent with right ovarian dermoid cyst.   Procedure: The patient's taken to the operating room and placed under general endotracheal anesthesia testing difficulty. She is placed in a dorsolithotomy position and cervical acromial pad was placed. The arms were tucked with care taken to pad the olecranon process. And prepped and draped in usual sterile fashion. A uterine manipulator (zumi) was placed vaginally. A 6mm incision was made in the left upper quadrant palmer's point and a 5 mm Optiview trocar used to enter the abdomen under direct visualization. With entry into the abdomen and then maintenance of 15 mm of mercury the patient was placed in Trendelenburg position. An incision was made in the umbilicus and an 10mm trochar was placed through this site. Two incisions were made lateral to the umbilical incision in the left and right abdomen measuring 49mm. These incisions were made  approximately 10 cm lateral to the umbilical incision. 8 mm robotic trochars were inserted. The robot was docked.  The abdomen was inspected as was the pelvis.  Pelvic washings were obtained. An incision was made on the right pelvic side wall peritoneum parallel to the IP ligament and the retroperitoneal space entered. The right ureter was identified and the para-rectal space was developed. A window was created in the right broad ligament above the ureter. The right infundibulopelvic vessels were skeletonized cauterized and transected. The utero-ovarian ligaments similarly were cauterized and transected. Specimen was placed in an Endo Catch bag.  On the left side the ovary was inspected an an incision was made in the cortex overlying the apparent cystic structure. The cyst was carefully peeled from its surrounding cortex and placed in a bag. It was clinically consistent with  Dermoid cyst.   The robot was undocked. The contents of the left Endo Catch bag were delivered through the LUQ incision. In a similar fashion the contents of the right Endo Catch bag or morcellated to facilitate removal from the abdominal cavity contained within the bag.   The ports were all remove. The fascial closure at the umbilical incision and left upper quadrant port was made with 0 Vicryl.  All incisions were closed with a running subcuticular Monocryl suture. Dermabond was applied. Sponge, lap and needle counts were correct x 3.    The patient had sequential compression devices for VTE prophylaxis.         Disposition: PACU          Condition: stable  Donaciano Eva, MD

## 2019-01-23 NOTE — Anesthesia Postprocedure Evaluation (Signed)
Anesthesia Post Note  Patient: EITHEL RYALL  Procedure(s) Performed: XI ROBOTIC ASSISTED RIGHT SALPINGO OOPHORECTOMY (Right ) XI ROBOTIC ASSISTED LAPAROSCOPIC LEFT  OVARIAN CYSTECTOMY (Left )     Patient location during evaluation: PACU Anesthesia Type: General Level of consciousness: awake and alert Pain management: pain level controlled Vital Signs Assessment: post-procedure vital signs reviewed and stable Respiratory status: spontaneous breathing, nonlabored ventilation and respiratory function stable Cardiovascular status: blood pressure returned to baseline and stable Postop Assessment: no apparent nausea or vomiting Anesthetic complications: no    Last Vitals:  Vitals:   01/23/19 0930 01/23/19 0945  BP: 121/78 123/73  Pulse: 88 90  Resp: (!) 21 (!) 21  Temp:    SpO2: 100% 98%    Last Pain:  Vitals:   01/23/19 0945  TempSrc:   PainSc: 0-No pain                 Audry Pili

## 2019-01-23 NOTE — Interval H&P Note (Signed)
History and Physical Interval Note:  01/23/2019 7:13 AM  [name]  has presented today for surgery, with the diagnosis of RIGHT OVARIAN CARCINOID TUMOR, LEFT OVARIAN DERMOID TUMOR.  The various methods of treatment have been discussed with the patient and family. After consideration of risks, benefits and other options for treatment, the patient has consented to  Procedure(s): XI ROBOTIC ASSISTED RIGHT SALPINGO OOPHORECTOMY (Right) XI ROBOTIC ASSISTED LAPAROSCOPIC LEFT  OVARIAN CYSTECTOMY (Left) as a surgical intervention.  The patient's history has been reviewed, patient examined, no change in status, stable for surgery.  I have reviewed the patient's chart and labs.  Questions were answered to the patient's satisfaction.     Thereasa Solo

## 2019-01-23 NOTE — Discharge Instructions (Signed)
Unilateral Salpingo-Oophorectomy, Care After °This sheet gives you information about how to care for yourself after your procedure. Your health care provider may also give you more specific instructions. If you have problems or questions, contact your health care provider. °What can I expect after the procedure? °After the procedure, it is common to have: °· Abdominal pain. °· Some occasional vaginal bleeding (spotting). °· Tiredness. °Follow these instructions at home: °Incision care ° °· Keep your incision area and your bandage (dressing) clean and dry. °· Follow instructions from your health care provider about how to take care of your incision. Make sure you: °? Wash your hands with soap and water before you change your dressing. If soap and water are not available, use hand sanitizer. °? Change your dressing as told by your health care provider. °? Leave stitches (sutures), staples, skin glue, or adhesive strips in place. These skin closures may need to stay in place for 2 weeks or longer. If adhesive strip edges start to loosen and curl up, you may trim the loose edges. Do not remove adhesive strips completely unless your health care provider tells you to do that. °· Check your incision area every day for signs of infection. Check for: °? Redness, swelling, or pain. °? Fluid or blood. °? Warmth. °? Pus or a bad smell. °Activity °· Do not drive or use heavy machinery while taking prescription pain medicine. °· Do not drive for 24 hours if you received a medicine to help you relax (sedative). °· Take frequent, short walks throughout the day. Rest when you get tired. Ask your health care provider what activities are safe for you. °· Avoid activities that require great effort. Also, avoid heavy lifting. Do not lift anything that is heavier than 5 lb (2.3 kg), or the limit that your health care provider tells you, until he or she says that it is safe to do so. °· Do not douche, use tampons, or have sex until your  health care provider approves. °General instructions °· To prevent or treat constipation while you are taking prescription pain medicine, your health care provider may recommend that you: °? Drink enough fluid to keep your urine pale yellow. °? Take over-the-counter or prescription medicines. °? Eat foods that are high in fiber, such as fresh fruits and vegetables, whole grains, and beans. °? Limit foods that are high in fat and processed sugars, such as fried and sweet foods. °· Take over-the-counter and prescription medicines only as told by your health care provider. °· Do not take baths, swim, or use a hot tub until your health care provider approves. Ask your health care provider if you may take showers. You may only be allowed to take sponge baths. °· Wear compression stockings as told by your health care provider. These stockings help to prevent blood clots and reduce swelling in your legs. °· Keep all follow-up visits as told by your health care provider. This is important. °Contact a health care provider if: °· You have pain when you urinate. °· You have pus or a bad smelling discharge coming from your vagina. °· You have redness, swelling, or pain around your incision. °· You have fluid or blood coming from your incision. °· Your incision feels warm to the touch. °· You have pus or a bad smell coming from your incision. °· You have a fever. °· Your incision starts to break open. °· You have abdominal pain that gets worse or does not get better with medicine. °·   You develop a rash. °· You develop nausea and vomiting. °· You feel lightheaded. °Get help right away if: °· You develop pain in your chest or leg. °· You develop shortness of breath. °· You faint. °· You have increased bleeding from your vagina. °Summary °· After the procedure, it is common to have pain, tiredness, and occasional bleeding from the vagina. °· Follow instructions from your health care provider about how to take care of your  incision. °· Check your incision every day for signs of infection and report any symptoms to your health care provider. °· Follow instructions from your health care provider about activities and restrictions. °This information is not intended to replace advice given to you by your health care provider. Make sure you discuss any questions you have with your health care provider. °Document Released: 05/20/2009 Document Revised: 11/02/2016 Document Reviewed: 11/02/2016 °Elsevier Interactive Patient Education © 2019 [employer] ° °

## 2019-01-24 ENCOUNTER — Encounter (HOSPITAL_COMMUNITY): Payer: Self-pay | Admitting: Gynecologic Oncology

## 2019-01-24 ENCOUNTER — Telehealth: Payer: Self-pay

## 2019-01-24 NOTE — Telephone Encounter (Signed)
[name] states she is doing well from her surgery.Her pain is 2-3/10. She has taken 1 Ibuprofen 600 mg last evening. Afebrile. She is drinking and urinating well. She began the senokot-s last night.  No BM. Pt has not passed any gas yet to her knowledge. Incisions are not swollen or red. Throat sore.  Instructed [name] to garggle with salt water to help with the irritation from the intubation tube.  The irritation will clear over the next couple of days. [name] knows to call the office at [phone] if she has any questions or concerns.

## 2019-01-27 ENCOUNTER — Telehealth: Payer: Self-pay | Admitting: *Deleted

## 2019-01-27 ENCOUNTER — Encounter: Payer: Self-pay | Admitting: Gynecologic Oncology

## 2019-01-27 NOTE — Telephone Encounter (Signed)
Patient to send my chart pictures

## 2019-01-27 NOTE — Telephone Encounter (Signed)
Told Ms Foskey that Dr. Denman George reviewed the photos of the incisions and rash.  She stated that she could apply hydrocortisone 1% cream to  to the rash.   She does not feel as though the incisions are infected. She can apply a light coat of neosporin ointment to the incisions twice a day. She is to call the office if the redness of the incisions increases and or she has a temp of 100.4 or higher. Pt verbalized understanding Told Ms. Mainwaring that her surgical pathology came back no cancer per Dr. Denman George. Keep post op appointment as scheduled on 02-14-19.

## 2019-01-27 NOTE — Telephone Encounter (Signed)
Patient called and stated that one of her incisions is red. Patient has no drainage, and no fever. Incision is red and warm to the touch.

## 2019-01-28 ENCOUNTER — Telehealth: Payer: Self-pay

## 2019-01-28 NOTE — Telephone Encounter (Signed)
Lm in patient's voice mail regarding the detailed results of her final pathology as note below by Dr. Denman George. Pt can call back to the office if she has any questions or concerns.

## 2019-01-28 NOTE — Telephone Encounter (Signed)
-----   Message from Everitt Amber, MD sent at 01/27/2019  9:13 AM EDT ----- Regarding: pathology results Her pathology showed a benign "dermoid" cyst in both ovaries. There was no sign of the "carcinoid" tumor that she had the previous surgery. No further treatment is necessary. No further follow-up (eg ultrasounds) is necessary).  Thanks [name] ----- Message ----- From: Interface, Lab In [facility] Sent: 01/24/2019  12:12 PM EDT To: Everitt Amber, MD

## 2019-01-30 ENCOUNTER — Encounter: Payer: Self-pay | Admitting: Gynecologic Oncology

## 2019-01-30 ENCOUNTER — Telehealth: Payer: Self-pay | Admitting: Gynecologic Oncology

## 2019-01-30 DIAGNOSIS — T783XXA Angioneurotic edema, initial encounter: Secondary | ICD-10-CM

## 2019-01-30 MED ORDER — METHYLPREDNISOLONE 4 MG PO TBPK: ORAL_TABLET | ORAL | 0 refills | Status: DC

## 2019-01-30 NOTE — Telephone Encounter (Signed)
Patient reports itch and redness at all sites consistent with allergic reaction to glue. No drainage, no fever, no pain. This feels unlike her prior incisional infections felt. She feels it is an allergic reaction. I agree with this based on the appearance of the images she forwarded in Epic and her symptoms.  Recommended medrol dose pack (Day 1: 2 tablets by mouth before breakfast, 1 tablet after lunch, 1 tablet after dinner, 2 tablets at bedtime. Day 2: 1 tablet before breakfast, 1 tablet after lunch, 1 tablet after dinner and 2 tablets at bedtime. Day 3: 1 tablet before breakfast, 1 tablet after lunch, 1 tablet after dinner, 1 tablet at bedtime. Day 4: 1 tablet before breakfast, 1 tablet after lunch, 1 tablet at bedtime. Day 5: 1 tablet before breakfast, 1 tablet at bedtime. Day 6: 1 tablet before breakfast.). E prescribed this.  She will notify us if the symptoms get worse not better.  Thereasa Solo, MD

## 2019-01-31 ENCOUNTER — Telehealth: Payer: Self-pay | Admitting: Oncology

## 2019-01-31 NOTE — Telephone Encounter (Signed)
Schae called and said she started the Medrol Dosepak this morning at 10 am.  She took all 6 tablets per the pharmacist's direction.  She said she feels like her blood sugar is low, she is weak, shaky, hard to form thoughts and very tired.  She did say that her rash is less itchy.    Discussed that steroids can make you feel shaky and jittery.  Reviewed with Dr. Denman George who advised for patient to take the tablets more spaced out for tomorrow's dose, per prescription directions.  She verbalized understanding and agreement.

## 2019-02-05 ENCOUNTER — Encounter: Payer: Self-pay | Admitting: Gynecologic Oncology

## 2019-02-06 ENCOUNTER — Telehealth: Payer: Self-pay | Admitting: Internal Medicine

## 2019-02-06 NOTE — Telephone Encounter (Signed)
Returned call to patient re message left that she could not keep the rescheduled appointment for 8/5. Spoke with patient and per patient she need a 3 pm. Appointment time changed to 3 pm and confirmed with patient.

## 2019-02-06 NOTE — Telephone Encounter (Signed)
[facility] off day 8/3 f/u moved to 8/5. Other appointments remain the same. Left message. Schedule mailed.

## 2019-02-11 ENCOUNTER — Telehealth: Payer: Self-pay | Admitting: *Deleted

## 2019-02-11 NOTE — Telephone Encounter (Signed)
Called and spoke with the patient, moved her appt from 215pm to 415pm on Friday

## 2019-02-14 ENCOUNTER — Encounter: Payer: Self-pay | Admitting: Gynecologic Oncology

## 2019-02-14 ENCOUNTER — Inpatient Hospital Stay: Payer: Medicaid Other | Attending: Gynecologic Oncology | Admitting: Gynecologic Oncology

## 2019-02-14 ENCOUNTER — Other Ambulatory Visit: Payer: Self-pay

## 2019-02-14 VITALS — BP 113/76 | HR 98 | Temp 99.4°F | Resp 18 | Ht 64.0 in | Wt 257.0 lb

## 2019-02-14 DIAGNOSIS — N83201 Unspecified ovarian cyst, right side: Secondary | ICD-10-CM | POA: Diagnosis not present

## 2019-02-14 DIAGNOSIS — D3A098 Benign carcinoid tumors of other sites: Secondary | ICD-10-CM | POA: Insufficient documentation

## 2019-02-14 DIAGNOSIS — N83202 Unspecified ovarian cyst, left side: Secondary | ICD-10-CM | POA: Diagnosis not present

## 2019-02-14 DIAGNOSIS — Z90721 Acquired absence of ovaries, unilateral: Secondary | ICD-10-CM | POA: Diagnosis not present

## 2019-02-14 NOTE — Progress Notes (Signed)
Follow-up Note: Gyn-Onc  Consult was initially requested by Dr. Sabra Heck for the evaluation of [name] 33 y.o. female  CC:  Chief Complaint  Patient presents with  . dermoid cyst    bilateral, postop    Assessment/Plan:  [name]  is a 33 y.o.  year old with a history of a right ovarian carcinoid tumor. S/p right salpingo-oophorectomy and left ovarian cystectomy for bilateral dermoid cysts on January 23, 2019.  I discussed the benign findings with the patient.  She will follow-up with Dr. Sabra Heck in 1 year.  HPI: [name] is a 33 year old obese (BMI 40) P2, who is seen in consultation at the request of Dr Sabra Heck for a right ovarian cyst in the setting of a history of a right carcinoid tumor of the ovary.   The patient's tumor history began in 2015 when she underwent a laparoscopic right ovarian cystectomy.  Final pathology revealed a dermoid cyst with a 2.5 mm focus of carcinoid tumor.  Prior to that ovarian cystectomy she had been experiencing pregnancy-like symptoms.  She was seen by Dr. Marko Plume postoperatively who ordered a 24-hour urine5HIAA which was normal.  She was recommended to undergo close surveillance with high anticipation that the carcinoid had been completely resected.  Over the subsequent years she is undergone multiple serial ultrasounds with Dr. Sabra Heck due to her diagnosis of PCOS, the history of the prior carcinoid tumor, a new finding of a left ovarian dermoid cyst, and a history of abnormal uterine bleeding with thickened endometrium.  She is a Mirena IUD in place which is controlling her bleeding symptoms.  Her most recent ultrasound was on August 29, 2018.  The preceding ultrasound had been in August 2019.  It demonstrated a uterus measuring 8.7 x 4.8 x 3.2 cm with an IUD in situ.  The endometrium was 7 mm.  The left ovary was 2.8 x 1.9 x 1.9 cm with a 9 mm avascular dermoid cyst that was stable in size.  The right ovary measured 3.5 x 3 x 3.4 cm  in total, with a 3.3 x 2.5 x 2.7 cm new cystic lesion with internal calcification, avascular but increased in size from May 2019.  It appeared most consistent with a dermoid.  She underwent screening urine 24-hour 5HIAA which was normal at 3.9.  The patient has a history of 2 prior vaginal deliveries, she required assistance with Clomid therapy in order to conceive.  She carries a diagnosis of PCOS.  She is morbidly obese with a BMI of 45 kg/m.  Her prior surgeries include a laparoscopic cholecystectomy and a laparoscopic right ovarian cystectomy.  The patient was offered options of expectant management with serial ultrasounds or proceeding with surgery at the time of her consultation with me in February 2020.  She elected for close observation.  A repeat ultrasound scan was performed on May14, 2020 which revealed a uterus measuring 8.9 x 4.9 x 3.8 cm with a correctly placed IUD.  A right ovary measured 5.7 x 4.4 x 5.7 cm with avascular internal echoes.  There was a solid appearance which was more obvious today and had enlarged in size from 3.3 x 2.5 x 2.7 cm.  The left ovary measured 3.8 x 3.6 x 3.1 cm with a 2.8 x 3.5 x 2.7 cm cyst which had also increased in size today compared to before and internal solid lesion possibly a dermoid unchanged in size.  There is no free fluid in the  cul-de-sac.  The patient reported symptoms of intermittent flushing in the 2 months prior to this repeat ultrasound scan.  Interval Hx: On January 24, 2019 she underwent a robotic assisted right salpingo-oophorectomy and left ovarian cystectomy.  Intraoperative findings were significant for a 6 cm enlarged right ovary with a 2 to 3 cm dermoid cyst on the left.  Frozen section was benign on both.  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  The left ovarian cyst revealed mature cystic teratoma of the ovary.   Post-operatively she  identified developed a severe rash around her incisions. This was treated with a medrol dosepack.   Current Meds:  Outpatient Encounter Medications as of 02/14/2019  Medication Sig  . acetaminophen (TYLENOL) 325 MG tablet Take 650 mg by mouth every 6 (six) hours as needed for mild pain or headache.  Marland Kitchen FLUoxetine (PROZAC) 20 MG capsule Take 1 capsule (20 mg total) by mouth daily.  Marland Kitchen ibuprofen (ADVIL) 200 MG tablet Take 600 mg by mouth every 6 (six) hours as needed for headache or moderate pain.  . methylPREDNISolone (MEDROL DOSEPAK) 4 MG TBPK tablet Day 1: 2 tablets by mouth before breakfast, 1 tablet after lunch, 1 tablet after dinner, 2 tablets at bedtime. Day 2: 1 tablet before breakfast, 1 tablet after lunch, 1 tablet after dinner and 2 tablets at bedtime. Day 3: 1 tablet before breakfast, 1 tablet after lunch, 1 tablet after dinner, 1 tablet at bedtime. Day 4: 1 tablet before breakfast, 1 tablet after lunch, 1 tablet at bedtime. Day 5: 1 tablet before breakfast, 1 tablet at bedtime. Day 6: 1 tablet before breakfast.  . Nutritional Supplements (JUICE PLUS FIBRE PO) Take 4 each by mouth daily.  Marland Kitchen oxyCODONE (OXY IR/ROXICODONE) 5 MG immediate release tablet Take 1 tablet (5 mg total) by mouth every 4 (four) hours as needed for severe pain. For AFTER surgery, do not take and drive (Patient not taking: Reported on 01/14/2019)  . senna-docusate (SENOKOT-S) 8.6-50 MG tablet Take 2 tablets by mouth at bedtime. For AFTER surgery, do not take if having loose stools (Patient not taking: Reported on 01/14/2019)   No facility-administered encounter medications on file as of 02/14/2019.     Allergy:  Allergies  Allergen Reactions  . Augmentin [Amoxicillin-Pot Clavulanate] Swelling and Other (See Comments)    Facial swelling  . Penicillins Other (See Comments)    Reaction:  Unknown     Social Hx:   Social History   Socioeconomic History  . Marital status: Married    Spouse name: Not on file  . Number of  children: Not on file  . Years of education: Not on file  . Highest education level: Not on file  Occupational History  . Not on file  Social Needs  . Financial resource strain: Not on file  . Food insecurity    Worry: Not on file    Inability: Not on file  . Transportation needs    Medical: Not on file    Non-medical: Not on file  Tobacco Use  . Smoking status: Never Smoker  . Smokeless tobacco: Never Used  Substance and Sexual Activity  . Alcohol use: No  . Drug use: No  . Sexual activity: Yes    Partners: Male    Comment: Mirena inserted 04/30/17  Lifestyle  . Physical activity    Days per week: Not on file    Minutes per session: Not on  file  . Stress: Not on file  Relationships  . Social Herbalist on phone: Not on file    Gets together: Not on file    Attends religious service: Not on file    Active member of club or organization: Not on file    Attends meetings of clubs or organizations: Not on file    Relationship status: Not on file  . Intimate partner violence    Fear of current or ex partner: Not on file    Emotionally abused: Not on file    Physically abused: Not on file    Forced sexual activity: Not on file  Other Topics Concern  . Not on file  Social History Narrative  . Not on file    Past Surgical Hx:  Past Surgical History:  Procedure Laterality Date  . COLONOSCOPY  2011  . GALLBLADDER SURGERY    . IUD REMOVAL  01/14/2019  . LAPAROSCOPIC OVARIAN CYSTECTOMY Right 12/24/2013   Procedure: LAPAROSCOPIC OVARIAN CYSTECTOMY;  Surgeon: Azalia Bilis, MD;  Location: [facility];  Service: Gynecology;  Laterality: Right;  . ROBOTIC ASSISTED LAPAROSCOPIC OVARIAN CYSTECTOMY Left 01/23/2019   Procedure: XI ROBOTIC ASSISTED LAPAROSCOPIC LEFT  OVARIAN CYSTECTOMY;  Surgeon: Everitt Amber, MD;  Location: [facility];  Service: Gynecology;  Laterality: Left;  . ROBOTIC ASSISTED SALPINGO OOPHERECTOMY Right 01/23/2019   Procedure: XI ROBOTIC ASSISTED RIGHT SALPINGO  OOPHORECTOMY;  Surgeon: Everitt Amber, MD;  Location: [facility];  Service: Gynecology;  Laterality: Right;  . UPPER GI ENDOSCOPY  2011  . WISDOM TOOTH EXTRACTION      Past Medical Hx:  Past Medical History:  Diagnosis Date  . Anxiety   . Depression   . Infertility, female    clomid  . Migraines   . Ovarian carcinoma ([place])   . Ovarian cyst    dermoid   . PCOS (polycystic ovarian syndrome)   . Stomach problems    (migraines or spasms) since age 58     Past Gynecological History:  See HPI No LMP recorded.  Family Hx:  Family History  Problem Relation Age of Onset  . Thyroid disease Mother   . Hyperlipidemia Father   . Cancer Father        prostate cancer  . Breast cancer Maternal Grandmother   . Cancer Maternal Grandmother        bone cancer  . Diabetes Maternal Grandfather   . Anesthesia problems Neg Hx   . Hypotension Neg Hx   . Malignant hyperthermia Neg Hx   . Pseudochol deficiency Neg Hx     Review of Systems:  Constitutional  + 2 months of intermittent flushing ENT Normal appearing ears and nares bilaterally Skin/Breast  No rash, sores, jaundice, itching, dryness Cardiovascular  No chest pain, shortness of breath, or edema  Pulmonary  No cough or wheeze.  Gastro Intestinal  No nausea, vomitting, or diarrhoea. No bright red blood per rectum, no abdominal pain, change in bowel movement, or constipation.  Genito Urinary  No frequency, urgency, dysuria,  Musculo Skeletal  No myalgia, arthralgia, joint swelling or pain  Neurologic  No weakness, numbness, change in gait,  Psychology  No depression, anxiety, insomnia.   Vitals:  Blood pressure 113/76, pulse 98, temperature 99.4 F (37.4 C), temperature source Oral, resp. rate 18, height 5\' 4"  (1.626 m), weight 257 lb (116.6 kg), SpO2 100 %, currently breastfeeding.  Physical Exam: WD in NAD Neck  Supple NROM, without any enlargements.  Lymph  Node Survey No cervical supraclavicular or inguinal  adenopathy Cardiovascular  Pulse normal rate, regularity and rhythm. S1 and S2 normal.  Lungs  Clear to auscultation bilateraly, without wheezes/crackles/rhonchi. Good air movement.  Skin  No rash/lesions/breakdown  Psychiatry  Alert and oriented to person, place, and time  Abdomen  Normoactive bowel sounds, abdomen soft, non-tender and obese without evidence of hernia. Well healed incisions.  Back No CVA tenderness Genito Urinary  deferred Rectal  deferred Extremities  No bilateral cyanosis, clubbing or edema.   Thereasa Solo, MD  02/14/2019, 4:35 PM

## 2019-02-14 NOTE — Patient Instructions (Signed)
Dr Denman George removed the right ovary which showed a dermoid cyst. The tube was removed with it. The left ovarian cyst was removed (but you still have the ovary and tube on that side). This cyst was also a dermoid cyst.  No carcinoid tumor was found.  Please follow-up with Dr Sabra Heck in 1 year.  Call [phone] if you have any questions about your surgical recovery.

## 2019-02-19 ENCOUNTER — Encounter: Payer: Self-pay | Admitting: Obstetrics & Gynecology

## 2019-02-19 ENCOUNTER — Telehealth: Payer: Self-pay | Admitting: Obstetrics & Gynecology

## 2019-02-19 NOTE — Telephone Encounter (Signed)
Patient sent the following message through [place]. Routing to triage to assist patient with request.  Hi Dr Sabra Heck! It's been 4.5 weeks since my surgery so I don't think that this has anything to do with that but.Marland KitchenMarland KitchenI started bleeding 2 days ago. Wild. I know. Maybe my body is working. But I've never had a cycle this heavy in my life and I wouldn't think there would be much build up after taking an IUD out. Last night and today it has been super heavy, like a super tampon every 2-3 hours. Which may be normal but definitely not for me. There have also been very small clots. Nothing major, pea size and smaller. I'm just wondering what's going on. Is this normal. Am I good and my body is just changing what a cycle looks like. I'm just kinda baffled. Physically it has defiantly made me cramp, less appetite and a little more tired. I think that about sums it up. Either way, just wanting to make sure all this seems normal-ish. Thanks so much.

## 2019-02-19 NOTE — Telephone Encounter (Signed)
Spoke with patient. S/p salpingectomy and oophorectomy on 01/23/19 with Dr. Denman George. IUD removed 01/14/19. Menses for 1.5 wks after IUD removed. Spotting after surgery. Bleeding started again 02/17/19, changing super tampon q2-3hrs with small clots. Reports increased cramping and fatigue. Denies SHOB, weakness, dizziness, lightheadedness. Patient states bleeding is unusual  for her and would like guidance on next steps or reassurance that cycles are normal.  Advised patient to continue to monitor her bleeding, if bleeding increases to changing saturated tampon q1-2 hours, new symptoms develop or symptoms worsen, [facility] for further evaluation. I will review with Dr. Sabra Heck when she returns to the office on 7/16 and return call with recommendations. Patient is agreeable to OV on 7/16, if needed.   Dr. Sabra Heck -please review and advise. If OV recommended, please advise on scheduling.

## 2019-02-20 NOTE — Telephone Encounter (Signed)
Left message to call Lyndsey Demos, RN at [facility] [phone].   

## 2019-02-20 NOTE — Telephone Encounter (Signed)
Patient returned call

## 2019-02-20 NOTE — Telephone Encounter (Signed)
She does not have regular cycles, ever.  As long as bleeding starts to lessen, it is ok to just monitor.  If still bleeding heavily like yesterday, may need progesterone to try and lessen bleeding.  Would use aygestin 5mg  bid until bleeding stops and then decrease to daily.  Please call and get update.  Thanks.

## 2019-02-20 NOTE — Telephone Encounter (Signed)
Spoke with patient. Patient reports bleeding has decreased some today, changing super plus tampon q4 hrs. No new symptoms. Patient will continue to monitor for now, is aware to return call to office if new symptoms develop or bleeding becomes heavy, changing saturated pad/tampon q1-2 hrs.   Routing to provider for final review. Patient is agreeable to disposition. Will close encounter.

## 2019-03-03 ENCOUNTER — Inpatient Hospital Stay: Payer: Medicaid Other

## 2019-03-03 ENCOUNTER — Other Ambulatory Visit: Payer: Self-pay

## 2019-03-03 DIAGNOSIS — N83202 Unspecified ovarian cyst, left side: Secondary | ICD-10-CM | POA: Diagnosis not present

## 2019-03-03 DIAGNOSIS — D72828 Other elevated white blood cell count: Secondary | ICD-10-CM

## 2019-03-03 LAB — CBC WITH DIFFERENTIAL (CANCER CENTER ONLY)
Abs Immature Granulocytes: 0.04 10*3/uL (ref 0.00–0.07)
Basophils Absolute: 0 10*3/uL (ref 0.0–0.1)
Basophils Relative: 0 %
Eosinophils Absolute: 0.2 10*3/uL (ref 0.0–0.5)
Eosinophils Relative: 2 %
HCT: 41.6 % (ref 36.0–46.0)
Hemoglobin: 13.1 g/dL (ref 12.0–15.0)
Immature Granulocytes: 0 %
Lymphocytes Relative: 40 %
Lymphs Abs: 4.1 10*3/uL — ABNORMAL HIGH (ref 0.7–4.0)
MCH: 27.6 pg (ref 26.0–34.0)
MCHC: 31.5 g/dL (ref 30.0–36.0)
MCV: 87.6 fL (ref 80.0–100.0)
Monocytes Absolute: 0.6 10*3/uL (ref 0.1–1.0)
Monocytes Relative: 6 %
Neutro Abs: 5.3 10*3/uL (ref 1.7–7.7)
Neutrophils Relative %: 52 %
Platelet Count: 300 10*3/uL (ref 150–400)
RBC: 4.75 MIL/uL (ref 3.87–5.11)
RDW: 13.2 % (ref 11.5–15.5)
WBC Count: 10.2 10*3/uL (ref 4.0–10.5)
nRBC: 0 % (ref 0.0–0.2)

## 2019-03-03 LAB — CMP (CANCER CENTER ONLY)
ALT: 17 U/L (ref 0–44)
AST: 11 U/L — ABNORMAL LOW (ref 15–41)
Albumin: 3.7 g/dL (ref 3.5–5.0)
Alkaline Phosphatase: 57 U/L (ref 38–126)
Anion gap: 8 (ref 5–15)
BUN: 11 mg/dL (ref 6–20)
CO2: 25 mmol/L (ref 22–32)
Calcium: 9.4 mg/dL (ref 8.9–10.3)
Chloride: 106 mmol/L (ref 98–111)
Creatinine: 0.74 mg/dL (ref 0.44–1.00)
GFR, Est AFR Am: >60 mL/min (ref >60)
GFR, Estimated: >60 mL/min (ref >60)
Glucose, Bld: 114 mg/dL — ABNORMAL HIGH (ref 70–99)
Potassium: 4.1 mmol/L (ref 3.5–5.1)
Sodium: 139 mmol/L (ref 135–145)
Total Bilirubin: 0.3 mg/dL (ref 0.3–1.2)
Total Protein: 7.3 g/dL (ref 6.5–8.1)

## 2019-03-03 LAB — LACTATE DEHYDROGENASE: LDH: 141 U/L (ref 98–192)

## 2019-03-07 ENCOUNTER — Telehealth: Payer: Self-pay | Admitting: Hematology

## 2019-03-07 NOTE — Telephone Encounter (Signed)
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 Rescheduled from 8/5 to 8/10. Confirmed with patient.

## 2019-03-10 ENCOUNTER — Ambulatory Visit: Payer: Medicaid Other | Admitting: Internal Medicine

## 2019-03-12 ENCOUNTER — Inpatient Hospital Stay: Payer: Medicaid Other | Admitting: Internal Medicine

## 2019-03-13 NOTE — Progress Notes (Signed)
[place]   Telephone:([phone]) [phone] Fax:([phone]) [place] Note   Patient Care Team: Cari Caraway, MD as PCP - General (Family Medicine)  Date of Service:  03/17/2019   CHIEF COMPLAINTS/PURPOSE OF CONSULTATION:  Leukocytosis   REFERRING PHYSICIAN:  Dr. Walden Field   HISTORY OF PRESENTING ILLNESS:  [name] 33 y.o. female is a here because of leukocytosis. She was previously under the care of Dr. Walden Field but has transferred her care to me. The patient presents to the clinic today alone.  She notes her WBC levels have been stable and mildly lower. She notes recent drop in WBC lower than her baseline.  Today the patient notes have chronic GI issues since she was 33yo. She has recurrent abdominal pain/spasms. In the last 2-3 months she has had nausea without vomiting. Eating exacerbates it. This persisted after her ovarian surgery. She did have right salpingo-oophorectomy for her PCOS and prior carcinoid tumor of ovary. She did have carcinoid tumor of right ovary in 2015 and the tumor was removed. She notes her MGM and 2-3 of her maternal aunts had breast cancer.     REVIEW OF SYSTEMS:   Constitutional: Denies fevers, chills or abnormal night sweats Eyes: Denies blurriness of vision, double vision or watery eyes Ears, nose, mouth, throat, and face: Denies mucositis or sore throat Respiratory: Denies cough, dyspnea or wheezes Cardiovascular: Denies palpitation, chest discomfort or lower extremity swelling Gastrointestinal:  Denies nausea, heartburn or change in bowel habits (+) nausea  Skin: Denies abnormal skin rashes Lymphatics: Denies new lymphadenopathy or easy bruising Neurological:Denies numbness, tingling or new weaknesses Behavioral/Psych: Mood is stable, no new changes  All other systems were reviewed with the patient and are negative.   MEDICAL HISTORY:  Past Medical History:  Diagnosis Date  . Anxiety   . Depression   . Infertility,  female    clomid  . Migraines   . Ovarian cyst    dermoid   . PCOS (polycystic ovarian syndrome)   . Stomach problems    (migraines or spasms) since age 54     SURGICAL HISTORY: Past Surgical History:  Procedure Laterality Date  . COLONOSCOPY  2011  . GALLBLADDER SURGERY    . IUD REMOVAL  01/14/2019  . LAPAROSCOPIC OVARIAN CYSTECTOMY Right 12/24/2013   Procedure: LAPAROSCOPIC OVARIAN CYSTECTOMY;  Surgeon: Azalia Bilis, MD;  Location: [facility];  Service: Gynecology;  Laterality: Right;  . ROBOTIC ASSISTED LAPAROSCOPIC OVARIAN CYSTECTOMY Left 01/23/2019   Procedure: XI ROBOTIC ASSISTED LAPAROSCOPIC LEFT  OVARIAN CYSTECTOMY;  Surgeon: Everitt Amber, MD;  Location: [facility];  Service: Gynecology;  Laterality: Left;  . ROBOTIC ASSISTED SALPINGO OOPHERECTOMY Right 01/23/2019   Procedure: XI ROBOTIC ASSISTED RIGHT SALPINGO OOPHORECTOMY;  Surgeon: Everitt Amber, MD;  Location: [facility];  Service: Gynecology;  Laterality: Right;  . UPPER GI ENDOSCOPY  2011  . WISDOM TOOTH EXTRACTION      SOCIAL HISTORY: Social History   Socioeconomic History  . Marital status: Married    Spouse name: Not on file  . Number of children: Not on file  . Years of education: Not on file  . Highest education level: Not on file  Occupational History  . Not on file  Social Needs  . Financial resource strain: Not on file  . Food insecurity    Worry: Not on file    Inability: Not on file  . Transportation needs    Medical: Not on file    Non-medical:  Not on file  Tobacco Use  . Smoking status: Never Smoker  . Smokeless tobacco: Never Used  Substance and Sexual Activity  . Alcohol use: No  . Drug use: No  . Sexual activity: Yes    Partners: Male    Comment: Mirena inserted 04/30/17  Lifestyle  . Physical activity    Days per week: Not on file    Minutes per session: Not on file  . Stress: Not on file  Relationships  . Social Herbalist on phone: Not on file    Gets together: Not on file     Attends religious service: Not on file    Active member of club or organization: Not on file    Attends meetings of clubs or organizations: Not on file    Relationship status: Not on file  . Intimate partner violence    Fear of current or ex partner: Not on file    Emotionally abused: Not on file    Physically abused: Not on file    Forced sexual activity: Not on file  Other Topics Concern  . Not on file  Social History Narrative  . Not on file    FAMILY HISTORY: Family History  Problem Relation Age of Onset  . Thyroid disease Mother   . Hyperlipidemia Father   . Cancer Father        prostate cancer  . Breast cancer Maternal Grandmother   . Cancer Maternal Grandmother        bone cancer  . Diabetes Maternal Grandfather   . Anesthesia problems Neg Hx   . Hypotension Neg Hx   . Malignant hyperthermia Neg Hx   . Pseudochol deficiency Neg Hx     ALLERGIES:  is allergic to augmentin [amoxicillin-pot clavulanate] and penicillins.  MEDICATIONS:  Current Outpatient Medications  Medication Sig Dispense Refill  . FLUoxetine (PROZAC) 20 MG capsule Take 1 capsule (20 mg total) by mouth daily. 90 capsule 4  . Nutritional Supplements (JUICE PLUS FIBRE PO) Take 4 each by mouth daily.     No current facility-administered medications for this visit.     PHYSICAL EXAMINATION: ECOG PERFORMANCE STATUS: 0 - Asymptomatic  Vitals:   03/17/19 1522  BP: 118/82  Pulse: 100  Resp: 18  Temp: 98.9 F (37.2 C)  SpO2: 100%   Filed Weights   03/17/19 1522  Weight: 258 lb 6.4 oz (117.2 kg)    GENERAL:alert, no distress and comfortable SKIN: skin color, texture, turgor are normal, no rashes or significant lesions EYES: normal, Conjunctiva are pink and non-injected, sclera clear  NECK: supple, thyroid normal size, non-tender, without nodularity LYMPH:  no palpable lymphadenopathy in the cervical, axillary  LUNGS: clear to auscultation and percussion with normal breathing effort HEART:  regular rate & rhythm and no murmurs and no lower extremity edema ABDOMEN:abdomen soft, non-tender and normal bowel sounds Musculoskeletal:no cyanosis of digits and no clubbing  NEURO: alert & oriented x 3 with fluent speech, no focal motor/sensory deficits  LABORATORY DATA:  I have reviewed the data as listed CBC Latest Ref Rng & Units 03/03/2019 01/16/2019 10/17/2018  WBC 4.0 - 10.5 K/uL 10.2 16.0(H) 12.0(H)  Hemoglobin 12.0 - 15.0 g/dL 13.1 13.6 13.6  Hematocrit 36.0 - 46.0 % 41.6 43.9 43.2  Platelets 150 - 400 K/uL 300 301 286    CMP Latest Ref Rng & Units 03/03/2019 01/16/2019 10/17/2018  Glucose 70 - 99 mg/dL 114(H) 123(H) 87  BUN 6 - 20 mg/dL 11  12 13  Creatinine 0.44 - 1.00 mg/dL 0.74 0.62 0.76  Sodium 135 - 145 mmol/L 139 140 140  Potassium 3.5 - 5.1 mmol/L 4.1 4.1 4.2  Chloride 98 - 111 mmol/L 106 103 102  CO2 22 - 32 mmol/L 25 27 28   Calcium 8.9 - 10.3 mg/dL 9.4 9.8 10.5(H)  Total Protein 6.5 - 8.1 g/dL 7.3 8.5(H) 7.9  Total Bilirubin 0.3 - 1.2 mg/dL 0.3 0.6 0.5  Alkaline Phos 38 - 126 U/L 57 60 58  AST 15 - 41 U/L 11(L) 16 13(L)  ALT 0 - 44 U/L 17 20 19      RADIOGRAPHIC STUDIES: I have personally reviewed the radiological images as listed and agreed with the findings in the report. No results found.  ASSESSMENT & PLAN:  RAYETTA VEITH is a 33 y.o. female with  1. Leukocytosis, likely reactive  -I have reviewed her previous lab work in chart.  She has had chronic mild leukocytosis with normal diff since her 20s, no significant clinical signs of allergy, never smoked, no evidence of iron deficiency or other inflammatory disease.   -BCR/ABL and MPN NGS were negative except DNMT3A [id number] which has been described in MDS and AML -Her recent CBC 2 weeks ago showed normal WBC 10.2 and a normal differentiation -I think her leukocytosis is likely reactive and a benign, I have no concern for primary bone marrow disease --Latest labs from 7/27 show CBC and CMP WNL except  BG 114. LDH normal. Given her very mild levels this recent normal WBC, this is likely benign and can be monitored by her PCP or Gyn labs.  -F/u open, as needed   2. GI symptoms -She has been having chronic GI symptoms since she was 33yo.  -Main symptoms of abdominal pain or spasms. In the last 2-3 months she has had nausea without vomiting.  -I recommend she f/u with GI, she will find one near her new place, likely with [facility].   3. PCOS, carcinoid tumor of right ovary in 12/2013 (s/p cystectomy) -continue to Follow-up with GYN as directed.   -She underwent right sphingo-oophorectomy by Dr. Denman George on 01/23/19.  -I discussed her PCOS can effect her weight. She notes having issues controlling her weight. I encouraged her to try [facility]. She is interested.   4. Genetic Testing  -Her MGM and 2-3 of her maternal aunts had breast cancer. I discussed she qualifies for genetic testing for breast cancer. She is interested.  -She was instructed to start screening mammogram around 33yo. I discussed she may benefit from MRI breast screening if she is interested.   PLAN:  -Refer to [facility].  -Genetics referral  -F/u open     Orders Placed This Encounter  Procedures  . Ambulatory referral to Genetics    Referral Priority:   Routine    Referral Type:   Consultation    Referral Reason:   Specialty Services Required    Number of Visits Requested:   1  . Amb Ref to Medical Weight Management    Referral Priority:   Routine    Referral Type:   Consultation    Number of Visits Requested:   1    All questions were answered. The patient knows to call the clinic with any problems, questions or concerns. I spent 20 minutes counseling the patient face to face. The total time spent in the appointment was 25 minutes and more than 50% was on counseling.  Truitt Merle, MD 03/17/2019 11:23 PM  I, Joslyn Devon, am acting as scribe for Truitt Merle, MD.   I  have reviewed the above documentation for accuracy and completeness, and I agree with the above.

## 2019-03-17 ENCOUNTER — Inpatient Hospital Stay: Payer: Medicaid Other | Attending: Gynecologic Oncology | Admitting: Hematology

## 2019-03-17 ENCOUNTER — Other Ambulatory Visit: Payer: Self-pay

## 2019-03-17 ENCOUNTER — Encounter: Payer: Self-pay | Admitting: Hematology

## 2019-03-17 VITALS — BP 118/82 | HR 100 | Temp 98.9°F | Resp 18 | Ht 64.0 in | Wt 258.4 lb

## 2019-03-17 DIAGNOSIS — D72829 Elevated white blood cell count, unspecified: Secondary | ICD-10-CM | POA: Diagnosis not present

## 2019-03-17 DIAGNOSIS — F418 Other specified anxiety disorders: Secondary | ICD-10-CM | POA: Insufficient documentation

## 2019-03-17 DIAGNOSIS — Z79899 Other long term (current) drug therapy: Secondary | ICD-10-CM | POA: Insufficient documentation

## 2019-03-17 DIAGNOSIS — Z8042 Family history of malignant neoplasm of prostate: Secondary | ICD-10-CM | POA: Diagnosis not present

## 2019-03-17 DIAGNOSIS — Z803 Family history of malignant neoplasm of breast: Secondary | ICD-10-CM | POA: Insufficient documentation

## 2019-03-17 DIAGNOSIS — R110 Nausea: Secondary | ICD-10-CM | POA: Insufficient documentation

## 2019-03-17 DIAGNOSIS — R109 Unspecified abdominal pain: Secondary | ICD-10-CM | POA: Insufficient documentation

## 2019-03-17 DIAGNOSIS — E282 Polycystic ovarian syndrome: Secondary | ICD-10-CM | POA: Insufficient documentation

## 2019-03-18 ENCOUNTER — Telehealth: Payer: Self-pay | Admitting: Hematology

## 2019-03-18 NOTE — Telephone Encounter (Signed)
Per 8/10 los Genetic referral  Weight management referral  F/u open

## 2019-03-18 NOTE — Telephone Encounter (Signed)
Scheduled appt per 8/10 sch message - pt aware of appt date and time   

## 2019-03-26 ENCOUNTER — Inpatient Hospital Stay: Payer: Medicaid Other | Admitting: Genetic Counselor

## 2019-03-26 ENCOUNTER — Inpatient Hospital Stay: Payer: Medicaid Other

## 2019-03-28 ENCOUNTER — Telehealth: Payer: Self-pay | Admitting: Genetic Counselor

## 2019-03-28 NOTE — Telephone Encounter (Signed)
Called patient regarding upcoming [facility] appointment, patient would prefer this visit to be a walk-in visit.

## 2019-03-31 ENCOUNTER — Inpatient Hospital Stay (HOSPITAL_BASED_OUTPATIENT_CLINIC_OR_DEPARTMENT_OTHER): Payer: Medicaid Other | Admitting: Genetic Counselor

## 2019-03-31 ENCOUNTER — Other Ambulatory Visit: Payer: Self-pay

## 2019-03-31 ENCOUNTER — Inpatient Hospital Stay: Payer: Medicaid Other

## 2019-03-31 ENCOUNTER — Encounter: Payer: Self-pay | Admitting: Genetic Counselor

## 2019-03-31 DIAGNOSIS — Z803 Family history of malignant neoplasm of breast: Secondary | ICD-10-CM | POA: Diagnosis not present

## 2019-03-31 DIAGNOSIS — Z7183 Encounter for nonprocreative genetic counseling: Secondary | ICD-10-CM | POA: Diagnosis not present

## 2019-03-31 DIAGNOSIS — Z8042 Family history of malignant neoplasm of prostate: Secondary | ICD-10-CM

## 2019-03-31 NOTE — Progress Notes (Signed)
REFERRING PROVIDER: Truitt Merle, MD [address] [place],  [place] [address]  PRIMARY PROVIDER:  Cari Caraway, MD  PRIMARY REASON FOR VISIT:  1. Family history of breast cancer   2. Family history of prostate cancer      HISTORY OF PRESENT ILLNESS:   [name], a 33 y.o. female, was seen for a [facility] cancer genetics consultation at the request of Dr. Burr Medico due to a family history of cancer.  [name] presents to clinic today to discuss the possibility of a hereditary predisposition to cancer, genetic testing, and to further clarify her future cancer risks, as well as potential cancer risks for family members.   [name] is a 33 y.o. female with no personal history of cancer.  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  CANCER HISTORY:  Oncology History   No history exists.     RISK FACTORS:  Menarche was at age 65.  First live birth at age 46.  OCP use for approximately 2-3 years.  Ovaries intact: one intact.  Hysterectomy: no.  Menopausal status: perimenopausal.  HRT use: 0 years. Colonoscopy: yes; normal. Mammogram within the last year: no. Number of breast biopsies: 0. Up to date with pelvic exams: yes. Any excessive radiation exposure in the past: no  Past Medical History:  Diagnosis Date  . Anxiety   . Depression   . Family history of breast cancer   . Family history of prostate cancer   . Infertility, female    clomid  . Migraines   . Ovarian cyst    dermoid   . PCOS (polycystic ovarian syndrome)   . Stomach problems    (migraines or spasms) since age 53     Past Surgical History:  Procedure Laterality Date  . COLONOSCOPY  2011  . GALLBLADDER SURGERY    . IUD REMOVAL  01/14/2019  . LAPAROSCOPIC OVARIAN CYSTECTOMY Right 12/24/2013   Procedure: LAPAROSCOPIC OVARIAN CYSTECTOMY;  Surgeon: Azalia Bilis, MD;  Location: [facility];  Service: Gynecology;  Laterality: Right;  . ROBOTIC ASSISTED LAPAROSCOPIC  OVARIAN CYSTECTOMY Left 01/23/2019   Procedure: XI ROBOTIC ASSISTED LAPAROSCOPIC LEFT  OVARIAN CYSTECTOMY;  Surgeon: Everitt Amber, MD;  Location: [facility];  Service: Gynecology;  Laterality: Left;  . ROBOTIC ASSISTED SALPINGO OOPHERECTOMY Right 01/23/2019   Procedure: XI ROBOTIC ASSISTED RIGHT SALPINGO OOPHORECTOMY;  Surgeon: Everitt Amber, MD;  Location: [facility];  Service: Gynecology;  Laterality: Right;  . UPPER GI ENDOSCOPY  2011  . WISDOM TOOTH EXTRACTION      Social History   Socioeconomic History  . Marital status: Married    Spouse name: Not on file  . Number of children: Not on file  . Years of education: Not on file  . Highest education level: Not on file  Occupational History  . Not on file  Social Needs  . Financial resource strain: Not on file  . Food insecurity    Worry: Not on file    Inability: Not on file  . Transportation needs    Medical: Not on file    Non-medical: Not on file  Tobacco Use  . Smoking status: Never Smoker  . Smokeless tobacco: Never Used  Substance and Sexual Activity  . Alcohol use: No  . Drug use: No  . Sexual activity: Yes    Partners: Male    Comment: Mirena inserted 04/30/17  Lifestyle  . Physical activity    Days per week: Not  on file    Minutes per session: Not on file  . Stress: Not on file  Relationships  . Social Herbalist on phone: Not on file    Gets together: Not on file    Attends religious service: Not on file    Active member of club or organization: Not on file    Attends meetings of clubs or organizations: Not on file    Relationship status: Not on file  Other Topics Concern  . Not on file  Social History Narrative  . Not on file     FAMILY HISTORY:  We obtained a detailed, 4-generation family history.  Significant diagnoses are listed below: Family History  Problem Relation Age of Onset  . Thyroid disease Mother   . Hyperlipidemia Father   . Cancer Father        prostate cancer  . Breast cancer  Maternal Grandmother 72       d. 23  . Cancer Maternal Grandmother        bone cancer  . Diabetes Maternal Grandfather   . Breast cancer Other        MGMs sister  . Breast cancer Other        MGMs sister  . Cancer Other        MGMs brother with bone cancer  . Anesthesia problems Neg Hx   . Hypotension Neg Hx   . Malignant hyperthermia Neg Hx   . Pseudochol deficiency Neg Hx     The patient has a son and daughter who are cancer free.  She has paternal half brother who is cancer free.  Both parents are deceased.  The patient's father had prostate cancer that is thought to be from agent orange exposure.  He has two brothers and a sister who are cancer free.  Her paternal grandparents are deceased, the grandfather was in a car accident, and the grandmother died of old age (43 years).  The patient's mother reportedly had genetic testing at some point which is thought to be negative.  She has a brother and sister who are cancer free.  Her mother was diagnosed at 35 and died at 8.  She had three brothers and two sisters.  Both sisters had post menopausal breast cancer.  One brother had bone cancer.  Several children of her siblings had breast cancer or skin cancer.  [name] is aware of previous family history of genetic testing for hereditary cancer risks. Patient's maternal ancestors are of [place] descent, and paternal ancestors are of English descent. There is no reported Ashkenazi Jewish ancestry. There is no known consanguinity.  GENETIC COUNSELING ASSESSMENT: [name] is a 33 y.o. female with a family history of breast cancer which is somewhat suggestive of a hereditary cancer syndrome and predisposition to cancer given her grandmother's young age of breast cancer and the number of people in the family diagnosed with breast cancer. We, therefore, discussed and recommended the following at today's visit.   DISCUSSION: We discussed that 5 - 10% of breast cancer is hereditary, with most  cases associated with BRCA mutations.  There are other genes that can be associated with hereditary breast cancer syndromes.  The patient reports that her mother may have had genetic testing in the past, and she thinks that it was negative.  We discussed that her mother is a more informative person to undergo updated testing than the patient because she biologically closer related to the grandmother, who is the  youngest diagnosed family member with cancer. However, [name] meets medical criteria for testing if she would want to proceed with testing. We discussed that testing is beneficial for several reasons including knowing how to follow individuals after completing their treatment, identifying whether potential treatment options such as PARP inhibitors would be beneficial, and understand if other family members could be at risk for cancer and allow them to undergo genetic testing.   We reviewed the characteristics, features and inheritance patterns of hereditary cancer syndromes. We also discussed genetic testing, including the appropriate family members to test, the process of testing, insurance coverage and turn-around-time for results. We discussed the implications of a negative, positive, carrier and/or variant of uncertain significant result. We recommended [name] pursue genetic testing for the Multi-cancer gene panel. The Multi-Gene Panel offered by [facility] includes sequencing and/or deletion duplication testing of the following 85 genes: AIP, ALK, APC, ATM, AXIN2,BAP1,  BARD1, BLM, BMPR1A, BRCA1, BRCA2, BRIP1, CASR, CDC73, CDH1, CDK4, CDKN1B, CDKN1C, CDKN2A (p14ARF), CDKN2A (p16INK4a), CEBPA, CHEK2, CTNNA1, DICER1, DIS3L2, EGFR (c.2369C>T, p.Thr790Met variant only), EPCAM (Deletion/duplication testing only), FH, FLCN, GATA2, GPC3, GREM1 (Promoter region deletion/duplication testing only), HOXB13 (c.251G>A, p.Gly84Glu), HRAS, KIT, MAX, MEN1, MET, MITF (c.952G>A, p.Glu318Lys variant only), MLH1, MSH2,  MSH3, MSH6, MUTYH, NBN, NF1, NF2, NTHL1, PALB2, PDGFRA, PHOX2B, PMS2, POLD1, POLE, POT1, PRKAR1A, PTCH1, PTEN, RAD50, RAD51C, RAD51D, RB1, RECQL4, RET, RNF43, RUNX1, SDHAF2, SDHA (sequence changes only), SDHB, SDHC, SDHD, SMAD4, SMARCA4, SMARCB1, SMARCE1, STK11, SUFU, TERC, TERT, TMEM127, TP53, TSC1, TSC2, VHL, WRN and WT1.    We discussed that some people do not want to undergo genetic testing due to fear of genetic discrimination.  A federal law called the Genetic Information Non-Discrimination Act (GINA) of 2008 helps protect individuals against genetic discrimination based on their genetic test results.  It impacts both health insurance and employment.  With health insurance, it protects against increased premiums, being kicked off insurance or being forced to take a test in order to be insured.  For employment it protects against hiring, firing and promoting decisions based on genetic test results.  Health status due to a cancer diagnosis is not protected under GINA.   Based on [name]'s family history of cancer, she meets medical criteria for genetic testing. Despite that she meets criteria, she may still have an out of pocket cost. We discussed that if her out of pocket cost for testing is over $100, the laboratory will call and confirm whether she wants to proceed with testing.  If the out of pocket cost of testing is less than $100 she will be billed by the genetic testing laboratory.   PLAN: Despite our recommendation, [name] did not wish to pursue genetic testing at today's visit. She will talk with her mother about updating her genetic testing.  We understand this decision and remain available to coordinate genetic testing at any time in the future. We, therefore, recommend [name] continue to follow the cancer screening guidelines given by her primary healthcare provider.  Based on [name]'s family history, we recommended her mother, or her mother's cousin, who was diagnosed with breast  cancer, have genetic counseling and testing. [name] will let us know if we can be of any assistance in coordinating genetic counseling and/or testing for this family member.   Lastly, we encouraged [name] to remain in contact with cancer genetics annually so that we can continuously update the family history and inform her of any changes in cancer genetics and  testing that may be of benefit for this family.   [name] questions were answered to her satisfaction today. Our contact information was provided should additional questions or concerns arise. Thank you for the referral and allowing [place] to share in the care of your patient.   Karen P. Florene Glen, [place], [facility] Licensed, Insurance risk surveyor [email or web address] phone: [phone]  The patient was seen for a total of 60 minutes in face-to-face genetic counseling.  This patient was discussed with Drs. Magrinat, Lindi Adie and/or Burr Medico who agrees with the above.    _______________________________________________________________________ For Office Staff:  Number of people involved in session: 1 Was an Intern/ student involved with case: yes [employer]

## 2019-06-30 ENCOUNTER — Encounter: Payer: Self-pay | Admitting: Certified Nurse Midwife

## 2019-06-30 ENCOUNTER — Other Ambulatory Visit: Payer: Self-pay

## 2019-06-30 ENCOUNTER — Ambulatory Visit: Payer: Medicaid Other | Admitting: Certified Nurse Midwife

## 2019-06-30 ENCOUNTER — Telehealth: Payer: Self-pay | Admitting: *Deleted

## 2019-06-30 VITALS — BP 120/70 | HR 70 | Temp 97.1°F | Resp 16 | Wt 257.0 lb

## 2019-06-30 DIAGNOSIS — N912 Amenorrhea, unspecified: Secondary | ICD-10-CM

## 2019-06-30 DIAGNOSIS — Z3201 Encounter for pregnancy test, result positive: Secondary | ICD-10-CM

## 2019-06-30 LAB — POCT URINE PREGNANCY: Preg Test, Ur: POSITIVE — AB

## 2019-06-30 NOTE — Telephone Encounter (Signed)
Spoke with patient. PUS scheduled for 07/10/19 at 1pm, consult at 1:30pm with Dr. Sabra Heck. Patient declined 07/17/19 appt. Order placed for precert. Patient verbalizes understanding and is agreeable.   Routing to provider for final review. Patient is agreeable to disposition. Will close encounter.   Cc: Dr.Miller, Magdalene Patricia, [address]

## 2019-06-30 NOTE — Progress Notes (Signed)
33 y.o.marital white female [id number] presents with amenorrhea with + UPT on 06/27/2019. LMP 05/24/2019, cycles have 31-33 day cycle..Not planned pregnancy. Complaining of breast tenderness on right side noted more, fatigue, nausea. Denies spotting, bleeding or ? cramping. Has noted some nausea no vomiting.  Medications she is taking are Prozac. Patient did not use anti-depressant during last pregnancy. Patient has not consumed alcohol since + UPT. Spouse supportive, but overwhelmed. History of infertility and previous clomid use. Patient trying to eat small frequent meals which is helping. Not a good time for the pregnancy but happy.   Review of Systems  Constitutional: Negative.   HENT: Negative.   Eyes: Negative.   Respiratory: Negative.   Cardiovascular: Negative.   Skin: Negative.     O: HPI pertinent to above. Healthy WDWN female Affect: normal, orientation x 3  Last Aex:12/23/2017 Pap smear: 12/19/2018 ASCUS negative HPV            Rubella screen: not done  A: Amenorrhea with positive UPT  06/27/19  5 wks  1days per [facility] with [facility] 03/30/2020 Unplanned pregnancy Depression on Prozac   P: Reviewed with patient importance of prenatal care during pregnancy. Given OB provider list. Reviewed nutrition importance of pregnancy and selecting from all food groups and making sure to have adequate protein intake daily. Discussed avoiding raw or exotic fish, soft cheeses due to risk of bacteria . Discussed concerns with FAS with alcohol use in pregnancy. Discussed increase of IUGR and SIDS with smoking use or second smoke. Reviewed warning signs of early pregnancy and need to advise if occurs. Discussed comfort measures for early pregnancy changes. Discussed need to stay on Prozac at this point and reviewd Up to Date review on use in pregnancy. Offered viability PUS here prior to initiating prenatal care. Patient will  plans to have PUS here.Marland Kitchen She will be called with insurance information and  scheduled. Questions addressed at length.  Labs:none  Rv prn   34 minutes in time spent with patient in face to face counseling regarding pregnancy and prenatal care

## 2019-06-30 NOTE — Telephone Encounter (Signed)
-----   Message from Regina Eck, CNM sent at 06/30/2019 12:48 PM EST ----- Please schedule PUS for this patient. She is 5 weeks now unplanned but happy. Prefers Dr. Sabra Heck with [place] if possible, if not OK.

## 2019-06-30 NOTE — Patient Instructions (Signed)

## 2019-07-08 ENCOUNTER — Other Ambulatory Visit: Payer: Self-pay

## 2019-07-09 ENCOUNTER — Telehealth: Payer: Self-pay | Admitting: Obstetrics & Gynecology

## 2019-07-09 NOTE — Telephone Encounter (Signed)
Spoke with patient at 11:02 this morning to advise We have not obtained prior approval for the scheduled viability ultrasound on 07/10/2019. Patient does not want to cancel ultrasound. Advised patient we can check back on the approval this afternoon and discuss rescheduling options at that time  Call placed to [employer] (the third party that handles ultrasound precerts for Medicaid)  [phone] to check status of pending case # [id number], advised case is still pending.  Call placed to patient to advise. Patient strongly does not want to cancel ultrasound. Explained to patient we cannot complete a viability ultrasoiund without the [employer] approval.  Patient states she had her right ovary removed in June and had a mass on the left ovary, patient is asking if ultrasound can be completed for these reasons.  Forwarding to Lamont Snowball, RN

## 2019-07-09 NOTE — Telephone Encounter (Signed)
Call to patient. Reviewed need to cancel ultrasound due to pending authorization.  Advised ultrasound is not medically urgent and need to await authorization. Appointment is at 1pm and agree to wait till 1030 to see if authorization is received.

## 2019-07-10 ENCOUNTER — Other Ambulatory Visit: Payer: Self-pay

## 2019-07-10 ENCOUNTER — Telehealth: Payer: Self-pay

## 2019-07-10 ENCOUNTER — Ambulatory Visit: Payer: Medicaid Other | Admitting: Obstetrics & Gynecology

## 2019-07-10 ENCOUNTER — Other Ambulatory Visit: Payer: Medicaid Other | Admitting: Obstetrics & Gynecology

## 2019-07-10 ENCOUNTER — Other Ambulatory Visit: Payer: Medicaid Other

## 2019-07-10 ENCOUNTER — Encounter: Payer: Self-pay | Admitting: Obstetrics & Gynecology

## 2019-07-10 VITALS — BP 114/70 | HR 80 | Temp 98.4°F | Ht 64.0 in | Wt 257.0 lb

## 2019-07-10 DIAGNOSIS — E282 Polycystic ovarian syndrome: Secondary | ICD-10-CM

## 2019-07-10 DIAGNOSIS — Z90721 Acquired absence of ovaries, unilateral: Secondary | ICD-10-CM

## 2019-07-10 DIAGNOSIS — N926 Irregular menstruation, unspecified: Secondary | ICD-10-CM | POA: Diagnosis not present

## 2019-07-10 DIAGNOSIS — D270 Benign neoplasm of right ovary: Secondary | ICD-10-CM

## 2019-07-10 DIAGNOSIS — R110 Nausea: Secondary | ICD-10-CM

## 2019-07-10 DIAGNOSIS — N912 Amenorrhea, unspecified: Secondary | ICD-10-CM

## 2019-07-10 DIAGNOSIS — N83201 Unspecified ovarian cyst, right side: Secondary | ICD-10-CM

## 2019-07-10 DIAGNOSIS — Z9079 Acquired absence of other genital organ(s): Secondary | ICD-10-CM

## 2019-07-10 DIAGNOSIS — D271 Benign neoplasm of left ovary: Secondary | ICD-10-CM

## 2019-07-10 DIAGNOSIS — D3A098 Benign carcinoid tumors of other sites: Secondary | ICD-10-CM

## 2019-07-10 LAB — BETA HCG QUANT (REF LAB): hCG Quant: 14973 m[IU]/mL

## 2019-07-10 MED ORDER — DOXYLAMINE-PYRIDOXINE 10-10 MG PO TBEC: DELAYED_RELEASE_TABLET | ORAL | 1 refills | Status: DC

## 2019-07-10 NOTE — Telephone Encounter (Signed)
Spoke to pt. Pt states having "debilitating nausea" and vomiting for1.5 weeks. Pt states having this with last 2 pregnancies and was on diclegis. Pt scheduled to see Dr Sabra Heck for [facility] 07/10/19 at 1:30pm   Pt is also wanting OB ultrasound today, informed pt of denial from insurance. Pt states called [employer] and one of the reasons listed that it was denied because pt wasn't having irregular cycles and pt states not true, has had irregular cycles for "years" [employer] states if Dr Sabra Heck calls back with case# (case# [id number])  ,then U/S would be approved. Will speak with Dr Sabra Heck and will call back pt with response.   Will route to Dr Sabra Heck and Lamont Snowball, RN  Cc: Deloris Ping

## 2019-07-10 NOTE — Telephone Encounter (Signed)
See office visit noted. Encounter closed.

## 2019-07-10 NOTE — Telephone Encounter (Signed)
See Previous phone notes.  Call placed to [facility], advised by Robin R at 9:08 AM 07/10/2019, obstetrical ultrasound request has been denied. (case # [id number])  Call placed to patient to convey. Patient acknowledges understanding. Ultrasound appointment has been denied.  Patient states she would still like to see Dr Sabra Heck today, to discuss her symptoms. Patient explains she is having "debilitating nausea"  Forwarding to Triage Nurse Cc: Lamont Snowball.

## 2019-07-10 NOTE — Progress Notes (Addendum)
GYNECOLOGY  VISIT  CC:   Patient complains of having "horrible nausea that is debilitating".  HPI: 33 y.o. G80P2002 Married White or Caucasian female here for positive pregnancy test.  She's taken several at home.  She just cannot believe she is pregnancy considering all of the things that have gon on with her in the last several months.  Pt has long hx of irregular cycles due to PCOS.  She's had to use Clomid for both prior pregnancies so this is a complete shock to her.  She was not actively trying.  She did have an IUD placed 04/24/2018 but had this removed 01/2019 due to persistent, irregular bleeding/spotting.   Pt has a hx of partial ovarian resection 12/24/13 with mature teratoma and features of carcinoid in the resection.  She went on to have a complete RSO and left ovarian cystectomy for additional mature teratomas.  L MP is 05/24/2019 which would make her 6 5/7 weeks with EDA of 02/28/2020.  I'm not confident this is good dating criteria due to her hx of irregular cycles.  She has started to have nausea.  She has use Diclegis in prior pregnancies and would like this.  Has started a PNV.  Medications reviewed.  On 20mg  fluoxetine.  Pt is having lots of stressors this year with home schooling, husband losing job, pandemia and does not feel stopping this is a good idea.  Risks reviewed.  She did take in a prior pregnancy and did breast feel while on fluoxetine with a prior pregnancy as well.  Denies pain or vaginal bleeding.  GYNECOLOGIC HISTORY: Patient's last menstrual period was 05/24/2019 (exact date). Contraception: none Menopausal hormone therapy: n/a  Patient Active Problem List   Diagnosis Date Noted  . Family history of breast cancer   . Family history of prostate cancer   . Ovarian mass, right 01/23/2019  . Right ovarian cyst 09/26/2018  . Anxiety 03/06/2015  . 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 Stomach spasm 03/06/2015  . Obesity, Class III, BMI 40-49.9 (morbid obesity) ([facility])  02/11/2014  . 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 Amenorrhea 11/19/2013  . Migraines   . Depression     Past Medical History:  Diagnosis Date  . Anxiety   . Depression   . Family history of breast cancer   . Family history of prostate cancer   . Infertility, female    clomid  . Migraines   . Ovarian cyst    dermoid   . PCOS (polycystic ovarian syndrome)   . Stomach problems    (migraines or spasms) since age 69     Past Surgical History:  Procedure Laterality Date  . COLONOSCOPY  2011  . GALLBLADDER SURGERY    . IUD REMOVAL  01/14/2019  . LAPAROSCOPIC OVARIAN CYSTECTOMY Right 12/24/2013   Procedure: LAPAROSCOPIC OVARIAN CYSTECTOMY;  Surgeon: Azalia Bilis, MD;  Location: [facility];  Service: Gynecology;  Laterality: Right;  . ROBOTIC ASSISTED LAPAROSCOPIC OVARIAN CYSTECTOMY Left 01/23/2019   Procedure: XI ROBOTIC ASSISTED LAPAROSCOPIC LEFT  OVARIAN CYSTECTOMY;  Surgeon: Everitt Amber, MD;  Location: [facility];  Service: Gynecology;  Laterality: Left;  . ROBOTIC ASSISTED SALPINGO OOPHERECTOMY Right 01/23/2019   Procedure: XI ROBOTIC ASSISTED RIGHT SALPINGO OOPHORECTOMY;  Surgeon: Everitt Amber, MD;  Location: [facility];  Service: Gynecology;  Laterality: Right;  . UPPER GI ENDOSCOPY  2011  . WISDOM TOOTH EXTRACTION      MEDS:   Current Outpatient Medications on File Prior  to Visit  Medication Sig Dispense Refill  . FLUoxetine (PROZAC) 20 MG capsule Take 1 capsule (20 mg total) by mouth daily. 90 capsule 4  . Nutritional Supplements (JUICE PLUS FIBRE PO) Take 4 each by mouth daily.    . Prenatal Vit-Fe Fumarate-FA (PRENATAL MULTIVITAMIN) TABS tablet Take 1 tablet by mouth daily at 12 noon.     No current facility-administered medications on file prior to visit.     ALLERGIES: Augmentin [amoxicillin-pot clavulanate] and Penicillins  Family History  Problem Relation Age of Onset  . Thyroid disease Mother   . Hyperlipidemia Father   . Cancer Father         prostate cancer  . Breast cancer Maternal Grandmother 60       d. 34  . Cancer Maternal Grandmother        bone cancer  . Diabetes Maternal Grandfather   . Breast cancer Other        MGMs sister  . Breast cancer Other        MGMs sister  . Cancer Other        MGMs brother with bone cancer  . Anesthesia problems Neg Hx   . Hypotension Neg Hx   . Malignant hyperthermia Neg Hx   . Pseudochol deficiency Neg Hx     SH:  Married, non smoker  Review of Systems  Gastrointestinal: Positive for nausea.  All other systems reviewed and are negative.   PHYSICAL EXAMINATION:    BP 114/70 (BP Location: Right Arm, Patient Position: Sitting, Cuff Size: Large)   Pulse 80   Temp 98.4 F (36.9 C) (Temporal)   Ht 5\' 4"  (1.626 m)   Wt 257 lb (116.6 kg)   LMP 05/24/2019 (Exact Date)   BMI 44.11 kg/m     General appearance: alert, cooperative and appears stated age  Chaperone was present for exam.  Assessment: Amenorrhea with positive UPT (pt is in disbelief about this) H/o clomid use with prior pregnancies H/o PCOS H/o partial ovarian resection  12/24/13 with mature teratoma and carcinoid, then RSO and left ovarian cystectomy 01/2019 with Dr. Everitt Amber due to changes in appearance of both ovaries.  Pathology showed bilateral mature teratomas. Nausea  Plan: Stat HCG will be obtained today She will return for PUS (peer to peer precert must be done and is scheduled for 4:45pm tomorrow) Continue PNV Rx for diclegis to pharmacy starting 2 tabs tonight and tomorrow, then two tabs at night and 1 in am for one day, increasing to 2 tabs at night and one in am and one midday.  #120/23F.   ~25 minutes spent with patient >50% of time was in face to face discussion of above.

## 2019-07-10 NOTE — Telephone Encounter (Signed)
Received STAT beta HCG from [facility].  Dr Sabra Heck informed of lab results.   Spoke with pt per Dr Sabra Heck. Pt notified of lab results and is awaiting confirmation for OB U/S after Dr Sabra Heck has meeting to approve [place]. Pt agreeable and awaiting call back.   Will route to Dr Sabra Heck for review.

## 2019-07-11 ENCOUNTER — Telehealth: Payer: Self-pay

## 2019-07-11 DIAGNOSIS — Z90721 Acquired absence of ovaries, unilateral: Secondary | ICD-10-CM | POA: Insufficient documentation

## 2019-07-11 NOTE — Telephone Encounter (Signed)
-----   Message from Megan Salon, MD sent at 07/11/2019  4:46 PM EST ----- Regarding: PUS Can you call pt and schedule her for PUS on Tuesday afternoon if possible.  Peer to peer approved her ultrasound.  Authorization number is [id number].  Thanks.  Edwinna Areola

## 2019-07-11 NOTE — Telephone Encounter (Signed)
Call placed to pt. Pt made appt for PUS viability.  Scheduled PUS 07/15/19 at 4pm. Pt agreeable.   Will route to Dr Sabra Heck for review. Will close encounter. Cc: Deloris Ping

## 2019-07-15 ENCOUNTER — Ambulatory Visit (INDEPENDENT_AMBULATORY_CARE_PROVIDER_SITE_OTHER): Payer: Medicaid Other

## 2019-07-15 ENCOUNTER — Ambulatory Visit: Payer: Medicaid Other | Admitting: Obstetrics & Gynecology

## 2019-07-15 ENCOUNTER — Other Ambulatory Visit: Payer: Self-pay

## 2019-07-15 ENCOUNTER — Encounter: Payer: Self-pay | Admitting: Obstetrics & Gynecology

## 2019-07-15 VITALS — BP 120/80 | HR 78 | Temp 97.7°F | Resp 14 | Ht 64.0 in | Wt 257.0 lb

## 2019-07-15 DIAGNOSIS — D270 Benign neoplasm of right ovary: Secondary | ICD-10-CM

## 2019-07-15 DIAGNOSIS — N912 Amenorrhea, unspecified: Secondary | ICD-10-CM | POA: Diagnosis not present

## 2019-07-15 DIAGNOSIS — Z3201 Encounter for pregnancy test, result positive: Secondary | ICD-10-CM

## 2019-07-15 DIAGNOSIS — Z3687 Encounter for antenatal screening for uncertain dates: Secondary | ICD-10-CM | POA: Diagnosis not present

## 2019-07-15 DIAGNOSIS — R110 Nausea: Secondary | ICD-10-CM | POA: Diagnosis not present

## 2019-07-15 DIAGNOSIS — N926 Irregular menstruation, unspecified: Secondary | ICD-10-CM

## 2019-07-15 DIAGNOSIS — Z3491 Encounter for supervision of normal pregnancy, unspecified, first trimester: Secondary | ICD-10-CM

## 2019-07-15 DIAGNOSIS — E282 Polycystic ovarian syndrome: Secondary | ICD-10-CM

## 2019-07-15 NOTE — Progress Notes (Signed)
33 y.o. G75P2002 Married White or Caucasian female here for pelvic ultrasound due to unsure dating with current pregnancy.  LMP was 05/24/2019 but she has irregular cycles so this is not very accurate dating criteria.  Denies pelvic pain or bleeding.  Still having a lot of nausea.  Taking two diclegis at night and 1 during the day.  She did throw up twice today.  This is new.  Has been keeping down all food/liquids until today.  Will monitor closely.  Patient's last menstrual period was 05/24/2019 (exact date).  Current Outpatient Medications on File Prior to Visit  Medication Sig Dispense Refill  . Doxylamine-Pyridoxine 10-10 MG TBEC 2 tabs q pm x 2 days.  If needed increase to 1 tab in am and 2 tabs at night.  If needed, can increase to 1 tab in am, 1 tab midday and 2 tabs q pm 120 tablet 1  . FLUoxetine (PROZAC) 20 MG capsule Take 1 capsule (20 mg total) by mouth daily. 90 capsule 4  . Nutritional Supplements (JUICE PLUS FIBRE PO) Take 4 each by mouth daily.    . Prenatal Vit-Fe Fumarate-FA (PRENATAL MULTIVITAMIN) TABS tablet Take 1 tablet by mouth daily at 12 noon.     No current facility-administered medications on file prior to visit.     Findings:  UTERUS: Gestational sac:  present, yolk sac:  present,  Fetal pole present.  CRL 0.69cm.  EGA based on ultrasound is 6 4/7 weeks (was 7 5/7 by LMP so dates changed) Fetal cardiac activity present: present at 142 BPM ADNEXA: Left ovary: 2.5 x 1.6 x 1.7cm with small cyst       Right ovary: surgically absent CUL DE SAC: no free fluid  Discussion:  Findings reviewed.  Dating is not consistent with dating by LMP.  EDC 03/05/2020 by ultrasound today..    Knows not to change kitty litter.  Tdap 07/18/11.  Had chicken pox.  Aware flu vaccine safe and recommended.   Ob options reviewed.  She would prefer a midwife and is going to consider faculty practice that is near her home in [place].  Information discussed.    Assessment:  First trimester  singleton pregnancy with S<D based on LMP H/o PCOS Obesity  Plan:  Ok to transfer to ob care at this point.  She will call tomorrow to let me know where she decided to do.  She will increase the Diclegis dosing tomorrow.  Has rx.   ~20 minutes spent with patient >50% of time was in face to face discussion of above.

## 2019-07-16 ENCOUNTER — Encounter: Payer: Self-pay | Admitting: Obstetrics & Gynecology

## 2019-07-16 ENCOUNTER — Telehealth: Payer: Self-pay

## 2019-07-16 NOTE — Telephone Encounter (Signed)
Patient sent the following correspondence through [place].  Ps.Marland KitchenMarland KitchenI should have specified in my last message I have zero issues driving 20 plus min to see a great midwife. So if she is still top of your list with the others I listed then it's a done deal for me. Just wanted to make sure you didn't know the others in any capacity before I reached out to [place]. Wherever she may be. [name]

## 2019-07-16 NOTE — Telephone Encounter (Signed)
Patient sent the following correspondence through [place].  Hey Dr Sabra Heck. Per our convo yesterday I reached out to the [place] location and they said [place] doesn't come to their location anymore. She apparently spends most of her time at [facility] now. They have 3 midwives at the [place] location... Fat[facility], [facility], and [employer] [facility]. So, I have 2 choices. Go to [place] since it's close or drive to [place] to specifally see [place]. I didn't know if you knew any of these names I listed at that location? Any advice is appreciated. Thanks again.

## 2019-07-16 NOTE — Telephone Encounter (Signed)
I responded to Mrs. [name] via [place].  Ok to close encounter.

## 2019-07-16 NOTE — Telephone Encounter (Signed)
Routing to Dr Sabra Heck for recommendations. Pt sent mychart message.

## 2019-08-03 ENCOUNTER — Inpatient Hospital Stay (HOSPITAL_COMMUNITY)
Admission: AD | Admit: 2019-08-03 | Discharge: 2019-08-03 | Disposition: A | Discharge status: Home / Self Care | Payer: Medicaid Other | Attending: Family Medicine | Admitting: Family Medicine

## 2019-08-03 ENCOUNTER — Telehealth: Payer: Self-pay | Admitting: Obstetrics and Gynecology

## 2019-08-03 ENCOUNTER — Encounter (HOSPITAL_COMMUNITY): Payer: Self-pay | Admitting: Family Medicine

## 2019-08-03 ENCOUNTER — Other Ambulatory Visit: Payer: Self-pay

## 2019-08-03 DIAGNOSIS — R519 Headache, unspecified: Secondary | ICD-10-CM

## 2019-08-03 DIAGNOSIS — O99211 Obesity complicating pregnancy, first trimester: Secondary | ICD-10-CM | POA: Diagnosis not present

## 2019-08-03 DIAGNOSIS — Z880 Allergy status to penicillin: Secondary | ICD-10-CM | POA: Diagnosis not present

## 2019-08-03 DIAGNOSIS — O99281 Endocrine, nutritional and metabolic diseases complicating pregnancy, first trimester: Secondary | ICD-10-CM | POA: Diagnosis not present

## 2019-08-03 DIAGNOSIS — E860 Dehydration: Secondary | ICD-10-CM | POA: Insufficient documentation

## 2019-08-03 DIAGNOSIS — F419 Anxiety disorder, unspecified: Secondary | ICD-10-CM | POA: Diagnosis not present

## 2019-08-03 DIAGNOSIS — F329 Major depressive disorder, single episode, unspecified: Secondary | ICD-10-CM | POA: Insufficient documentation

## 2019-08-03 DIAGNOSIS — O0901 Supervision of pregnancy with history of infertility, first trimester: Secondary | ICD-10-CM | POA: Diagnosis not present

## 2019-08-03 DIAGNOSIS — U071 COVID-19: Secondary | ICD-10-CM | POA: Insufficient documentation

## 2019-08-03 DIAGNOSIS — O219 Vomiting of pregnancy, unspecified: Secondary | ICD-10-CM

## 2019-08-03 DIAGNOSIS — E669 Obesity, unspecified: Secondary | ICD-10-CM | POA: Diagnosis not present

## 2019-08-03 DIAGNOSIS — O99341 Other mental disorders complicating pregnancy, first trimester: Secondary | ICD-10-CM | POA: Diagnosis not present

## 2019-08-03 DIAGNOSIS — Z803 Family history of malignant neoplasm of breast: Secondary | ICD-10-CM | POA: Diagnosis not present

## 2019-08-03 DIAGNOSIS — Z90721 Acquired absence of ovaries, unilateral: Secondary | ICD-10-CM | POA: Diagnosis not present

## 2019-08-03 DIAGNOSIS — Z8042 Family history of malignant neoplasm of prostate: Secondary | ICD-10-CM | POA: Diagnosis not present

## 2019-08-03 DIAGNOSIS — O99210 Obesity complicating pregnancy, unspecified trimester: Secondary | ICD-10-CM

## 2019-08-03 DIAGNOSIS — Z3A09 9 weeks gestation of pregnancy: Secondary | ICD-10-CM | POA: Diagnosis not present

## 2019-08-03 DIAGNOSIS — O98511 Other viral diseases complicating pregnancy, first trimester: Secondary | ICD-10-CM | POA: Diagnosis not present

## 2019-08-03 LAB — COMPREHENSIVE METABOLIC PANEL
ALT: 24 U/L (ref 0–44)
AST: 21 U/L (ref 15–41)
Albumin: 3.4 g/dL — ABNORMAL LOW (ref 3.5–5.0)
Alkaline Phosphatase: 44 U/L (ref 38–126)
Anion gap: 11 (ref 5–15)
BUN: 11 mg/dL (ref 6–20)
CO2: 22 mmol/L (ref 22–32)
Calcium: 9.2 mg/dL (ref 8.9–10.3)
Chloride: 101 mmol/L (ref 98–111)
Creatinine, Ser: 0.61 mg/dL (ref 0.44–1.00)
GFR calc Af Amer: >60 mL/min (ref >60)
GFR calc non Af Amer: >60 mL/min (ref >60)
Glucose, Bld: 90 mg/dL (ref 70–99)
Potassium: 3.6 mmol/L (ref 3.5–5.1)
Sodium: 134 mmol/L — ABNORMAL LOW (ref 135–145)
Total Bilirubin: 0.3 mg/dL (ref 0.3–1.2)
Total Protein: 6.7 g/dL (ref 6.5–8.1)

## 2019-08-03 LAB — CBC
HCT: 41 % (ref 36.0–46.0)
Hemoglobin: 13.1 g/dL (ref 12.0–15.0)
MCH: 27.2 pg (ref 26.0–34.0)
MCHC: 32 g/dL (ref 30.0–36.0)
MCV: 85.1 fL (ref 80.0–100.0)
Platelets: 257 10*3/uL (ref 150–400)
RBC: 4.82 MIL/uL (ref 3.87–5.11)
RDW: 13.1 % (ref 11.5–15.5)
WBC: 7.7 10*3/uL (ref 4.0–10.5)
nRBC: 0 % (ref 0.0–0.2)

## 2019-08-03 LAB — URINALYSIS, ROUTINE W REFLEX MICROSCOPIC
Bilirubin Urine: NEGATIVE
Glucose, UA: NEGATIVE mg/dL
Hgb urine dipstick: NEGATIVE
Ketones, ur: NEGATIVE mg/dL
Leukocytes,Ua: NEGATIVE
Nitrite: NEGATIVE
Protein, ur: NEGATIVE mg/dL
Specific Gravity, Urine: 1.025 (ref 1.005–1.030)
pH: 5 (ref 5.0–8.0)

## 2019-08-03 LAB — MAGNESIUM: Magnesium: 2 mg/dL (ref 1.7–2.4)

## 2019-08-03 LAB — SARS CORONAVIRUS 2 (TAT 6-24 HRS): SARS Coronavirus 2: POSITIVE — AB

## 2019-08-03 MED ORDER — LACTATED RINGERS IV BOLUS
1000.0000 mL | Freq: Once | INTRAVENOUS | Status: AC
  Administered 2019-08-03: 1000 mL via INTRAVENOUS

## 2019-08-03 MED ORDER — PROMETHAZINE HCL 25 MG/ML IJ SOLN
25.0000 mg | Freq: Once | INTRAMUSCULAR | Status: AC
  Administered 2019-08-03: 25 mg via INTRAVENOUS
  Filled 2019-08-03: qty 1

## 2019-08-03 NOTE — MAU Note (Signed)
Pt reports to mau with c/o nausea, headache, congestion, loss of taste and smell, and dizziness. since Thursday morning.  Pt also reports some mild abd. Cramping that she rates 2/10.  Pt states she had a fever on Thursday, but it has since gone away.  Pt denies vag bleeding.

## 2019-08-03 NOTE — Telephone Encounter (Signed)
After hours call.   She is 9+ weeks pregnant.  She has not been seen yet at [facility] office for the Center for [facility]. Appt scheduled for 08/11/18. Has cold symptoms and feeling dry mouth and fogginess.  She states she felt a fever between 100 and 101 F.  Feeling really sleepy and shaky.  She has some nausea. Some light cramping and no bleeding.   I recommend she has some take her to Maternity Unit at [facility] for further evaluation and treatment.  I shared with her she is having both signs of dehydration and potential Covid 19. She states her husband is able to take her to the hospital.

## 2019-08-03 NOTE — MAU Provider Note (Signed)
Chief Complaint: Abdominal Pain, Headache, Dizziness, and Nausea  First Provider Initiated Contact with Patient 08/03/19 1309     SUBJECTIVE HPI: [name] is a 33 y.o. G3P2002 at [redacted]w[redacted]d by early ultrasound who presents to maternity admissions reporting dehydration, fatigue, congestion, mild cough, nausea, hx of fever and vague abdominal discomfort. Patient reports temperature of 100-101 F on 12/24 and 12/25. No fever since. Did note some congestion those days but now reports only mild congestion. Also mild sore throat previously which is mostly resolved. Cough is productive and mild. Patient reports one episode of vomiting on 12/24 but none since; continues to report some nausea and vague abdominal discomfort although reports abdominal pain at baseline and cannot differentiate. No specific area of abdominal pain. No diarrhea/constipation. Mild headache; patient with hx of migraines. Hx of hyperemesis with one prior pregnancy. She has been taking Diclegis this pregnancy. Denies known COVID exposure. Some fatigue despite sleeping well at night. Husband with mild cough as well. Although reporting dehydration, patient has been tolerating PO okay. Last ate/drank this morning but reports feeling weak. Urinated earlier this morning but unable to leave a sample now. Does report minimal clear discharge but otherwise no OB complaints. She denies vaginal bleeding, vaginal itching/burning, urinary symptoms, dizziness.  Past Medical History:  Diagnosis Date  . Anxiety   . Depression   . Family history of breast cancer   . Family history of prostate cancer   . Infertility, female    clomid  . Migraines   . Ovarian cyst    dermoid   . PCOS (polycystic ovarian syndrome)   . Stomach problems    (migraines or spasms) since age 6    Past Surgical History:  Procedure Laterality Date  . COLONOSCOPY  2011  . GALLBLADDER SURGERY    . IUD REMOVAL  01/14/2019  . LAPAROSCOPIC OVARIAN CYSTECTOMY Right  12/24/2013   Procedure: LAPAROSCOPIC OVARIAN CYSTECTOMY;  Surgeon: Azalia Bilis, MD;  Location: [facility];  Service: Gynecology;  Laterality: Right;  . ROBOTIC ASSISTED LAPAROSCOPIC OVARIAN CYSTECTOMY Left 01/23/2019   Procedure: XI ROBOTIC ASSISTED LAPAROSCOPIC LEFT  OVARIAN CYSTECTOMY;  Surgeon: Everitt Amber, MD;  Location: [facility];  Service: Gynecology;  Laterality: Left;  . ROBOTIC ASSISTED SALPINGO OOPHERECTOMY Right 01/23/2019   Procedure: XI ROBOTIC ASSISTED RIGHT SALPINGO OOPHORECTOMY;  Surgeon: Everitt Amber, MD;  Location: [facility];  Service: Gynecology;  Laterality: Right;  . UPPER GI ENDOSCOPY  2011  . WISDOM TOOTH EXTRACTION     Social History   Socioeconomic History  . Marital status: Married    Spouse name: Not on file  . Number of children: Not on file  . Years of education: Not on file  . Highest education level: Not on file  Occupational History  . Not on file  Tobacco Use  . Smoking status: Never Smoker  . Smokeless tobacco: Never Used  Substance and Sexual Activity  . Alcohol use: No  . Drug use: No  . Sexual activity: Yes    Partners: Male    Birth control/protection: None  Other Topics Concern  . Not on file  Social History Narrative  . Not on file   Social Determinants of Health   Financial Resource Strain:   . Difficulty of Paying Living Expenses: Not on file  Food Insecurity:   . Worried About Charity fundraiser in the Last Year: Not on file  . Ran Out of Food in the Last Year: Not on file  Transportation Needs:   .  Lack of Transportation (Medical): Not on file  . Lack of Transportation (Non-Medical): Not on file  Physical Activity:   . Days of Exercise per Week: Not on file  . Minutes of Exercise per Session: Not on file  Stress:   . Feeling of Stress : Not on file  Social Connections:   . Frequency of Communication with Friends and Family: Not on file  . Frequency of Social Gatherings with Friends and Family: Not on file  . Attends Religious  Services: Not on file  . Active Member of Clubs or Organizations: Not on file  . Attends Archivist Meetings: Not on file  . Marital Status: Not on file  Intimate Partner Violence:   . Fear of Current or Ex-Partner: Not on file  . Emotionally Abused: Not on file  . Physically Abused: Not on file  . Sexually Abused: Not on file   No current facility-administered medications on file prior to encounter.   Current Outpatient Medications on File Prior to Encounter  Medication Sig Dispense Refill  . Doxylamine-Pyridoxine 10-10 MG TBEC 2 tabs q pm x 2 days.  If needed increase to 1 tab in am and 2 tabs at night.  If needed, can increase to 1 tab in am, 1 tab midday and 2 tabs q pm 120 tablet 1  . FLUoxetine (PROZAC) 20 MG capsule Take 1 capsule (20 mg total) by mouth daily. 90 capsule 4  . Nutritional Supplements (JUICE PLUS FIBRE PO) Take 4 each by mouth daily.    . Prenatal Vit-Fe Fumarate-FA (PRENATAL MULTIVITAMIN) TABS tablet Take 1 tablet by mouth daily at 12 noon.     Allergies  Allergen Reactions  . Augmentin [Amoxicillin-Pot Clavulanate] Swelling and Other (See Comments)    Facial swelling  . Penicillins Other (See Comments)    Reaction:  Unknown     ROS:  Review of Systems All other systems negative unless noted above in HPI.   I have reviewed patient's Past Medical Hx, Surgical Hx, Family Hx, Social Hx, medications and allergies.   Physical Exam   Patient Vitals for the past 24 hrs:  Temp Temp src Pulse Resp SpO2  08/03/19 1300 98.6 F (37 C) Oral 98 16 98 %   Constitutional: Well-developed, well-nourished female in no acute distress.  HEENT: EOMI, nares clear, no pharyngeal erythema or exudate Cardiovascular: normal rate Respiratory: normal effort GI: Abd soft, non-tender.  MS: Extremities nontender, no edema, normal ROM Neurologic: Alert and oriented x 4.  GU: Neg CVAT. PELVIC EXAM: Deferred  LAB RESULTS Results for orders placed or performed during  the hospital encounter of 08/03/19 (from the past 24 hour(s))  CBC     Status: None   Collection Time: 08/03/19  1:55 PM  Result Value Ref Range   WBC 7.7 4.0 - 10.5 K/uL   RBC 4.82 3.87 - 5.11 MIL/uL   Hemoglobin 13.1 12.0 - 15.0 g/dL   HCT 41.0 36.0 - 46.0 %   MCV 85.1 80.0 - 100.0 fL   MCH 27.2 26.0 - 34.0 pg   MCHC 32.0 30.0 - 36.0 g/dL   RDW 13.1 11.5 - 15.5 %   Platelets 257 150 - 400 K/uL   nRBC 0.0 0.0 - 0.2 %  Comprehensive metabolic panel     Status: Abnormal   Collection Time: 08/03/19  1:55 PM  Result Value Ref Range   Sodium 134 (L) 135 - 145 mmol/L   Potassium 3.6 3.5 - 5.1 mmol/L   Chloride  101 98 - 111 mmol/L   CO2 22 22 - 32 mmol/L   Glucose, Bld 90 70 - 99 mg/dL   BUN 11 6 - 20 mg/dL   Creatinine, Ser 0.61 0.44 - 1.00 mg/dL   Calcium 9.2 8.9 - 10.3 mg/dL   Total Protein 6.7 6.5 - 8.1 g/dL   Albumin 3.4 (L) 3.5 - 5.0 g/dL   AST 21 15 - 41 U/L   ALT 24 0 - 44 U/L   Alkaline Phosphatase 44 38 - 126 U/L   Total Bilirubin 0.3 0.3 - 1.2 mg/dL   GFR calc non Af Amer >60 >60 mL/min   GFR calc Af Amer >60 >60 mL/min   Anion gap 11 5 - 15  Magnesium     Status: None   Collection Time: 08/03/19  1:55 PM  Result Value Ref Range   Magnesium 2.0 1.7 - 2.4 mg/dL  Urinalysis, Routine w reflex microscopic     Status: Abnormal   Collection Time: 08/03/19  2:50 PM  Result Value Ref Range   Color, Urine YELLOW YELLOW   APPearance HAZY (A) CLEAR   Specific Gravity, Urine 1.025 1.005 - 1.030   pH 5.0 5.0 - 8.0   Glucose, UA NEGATIVE NEGATIVE mg/dL   Hgb urine dipstick NEGATIVE NEGATIVE   Bilirubin Urine NEGATIVE NEGATIVE   Ketones, ur NEGATIVE NEGATIVE mg/dL   Protein, ur NEGATIVE NEGATIVE mg/dL   Nitrite NEGATIVE NEGATIVE   Leukocytes,Ua NEGATIVE NEGATIVE    --/--/A POS (06/11 1430)  IMAGING US OB Transvaginal  Result Date: 07/15/2019 SEE PROGRESS NOTES FOR RESULTS    MAU Management/MDM: Orders Placed This Encounter  Procedures  . SARS CORONAVIRUS 2 (TAT  6-24 HRS) Nasopharyngeal Nasopharyngeal Swab  . CBC  . Comprehensive metabolic panel  . Magnesium  . Urinalysis, Routine w reflex microscopic  . Discharge patient Discharge disposition: 01-Home or Self Care; Discharge patient date: 08/03/2019    Meds ordered this encounter  Medications  . lactated ringers bolus 1,000 mL  . promethazine (PHENERGAN) injection 25 mg    Patient presented with complaint of dehydration. Overall well-appearing with stable vitals. Hx of hyperemesis. Patient given Phenergan, 1 L LR and CBC, CMP, Mg drawn. UA and labs without abnormality. Patient able to tolerate crackers and felt comfortable discharging home after IVF. Advised to quarantine until COVID results return. She has f/u on 1/5 with [facility].  Pt discharged with strict return precautions.  ASSESSMENT 1. Nausea and vomiting in pregnancy prior to [redacted] weeks gestation   2. Nonintractable headache, unspecified chronicity pattern, unspecified headache type   3. Obesity in pregnancy   4. Dehydration     PLAN Discharge home Allergies as of 08/03/2019      Reactions   Augmentin [amoxicillin-pot Clavulanate] Swelling, Other (See Comments)   Facial swelling   Penicillins Other (See Comments)   Reaction:  Unknown       Medication List    TAKE these medications   Doxylamine-Pyridoxine 10-10 MG Tbec 2 tabs q pm x 2 days.  If needed increase to 1 tab in am and 2 tabs at night.  If needed, can increase to 1 tab in am, 1 tab midday and 2 tabs q pm   FLUoxetine 20 MG capsule Commonly known as: PROZAC Take 1 capsule (20 mg total) by mouth daily.   JUICE PLUS FIBRE PO Take 4 each by mouth daily.   prenatal multivitamin Tabs tablet Take 1 tablet by mouth daily at 12 noon.  Barrington Ellison, MD [facility] Family Medicine Fellow, [facility], [facility] 08/03/2019  3:13 PM

## 2019-08-04 ENCOUNTER — Encounter: Payer: Self-pay | Admitting: Medical

## 2019-08-04 DIAGNOSIS — O98511 Other viral diseases complicating pregnancy, first trimester: Secondary | ICD-10-CM | POA: Insufficient documentation

## 2019-08-04 DIAGNOSIS — U071 COVID-19: Secondary | ICD-10-CM | POA: Insufficient documentation

## 2019-08-07 ENCOUNTER — Other Ambulatory Visit: Payer: Self-pay | Admitting: Gynecologic Oncology

## 2019-08-07 DIAGNOSIS — N83201 Unspecified ovarian cyst, right side: Secondary | ICD-10-CM

## 2019-08-08 NOTE — L&D Delivery Note (Addendum)
OB/GYN Faculty Practice Delivery Note  [name] is a 34 y.o. [id number] s/p VD at [redacted]w[redacted]d. She was admitted for IOL for GDMA2.   ROM: 0h 63m with clear fluid GBS Status: Negative/-- (07/06 0000) Maximum Maternal Temperature: 98.8F  Labor Progress: . Initial SVE: 1.5/60/-3. Patient received Cytotec x2. She then labored on her own and quickly progressed to complete after SROM.   Delivery Date/Time: 7/25 @ 1155 Delivery: Called to room and patient was complete and pushing. Head delivered ROA. No nuchal cord present. Shoulder and body delivered in usual fashion. Infant with spontaneous cry, placed on mother's abdomen, dried and stimulated. Cord clamped x 2 after 1-minute delay, and cut by FOB. Cord blood drawn. Placenta delivered spontaneously with gentle cord traction. Fundus firm with massage and Pitocin. Labia, perineum, vagina, and cervix inspected inspected with small first degree hemostatic laceration which was not repaired after shared-decision making.  Baby Weight: 3634g  Placenta: Sent to L&D Complications: None Lacerations: First degree hemostatic perineal laceration EBL: 220 mL Analgesia: Epidural   Infant:  APGAR (1 MIN): 7  APGAR (5 MINS): 9  APGAR (10 MINS):     Barrington Ellison, MD [facility] Family Medicine Fellow, [facility] for [facility], [facility] 02/29/2020, 12:33 PM

## 2019-08-12 ENCOUNTER — Telehealth: Payer: Self-pay | Admitting: *Deleted

## 2019-08-12 ENCOUNTER — Encounter: Payer: Medicaid Other | Admitting: Certified Nurse Midwife

## 2019-08-12 NOTE — Telephone Encounter (Signed)
Left URGENT message that appointment will have to be rescheduled due to her positive COVID-19 result on 08/03/19. Can come in the office after 08/24/19.

## 2019-08-18 ENCOUNTER — Ambulatory Visit (INDEPENDENT_AMBULATORY_CARE_PROVIDER_SITE_OTHER): Payer: Medicaid Other | Admitting: Family Medicine

## 2019-08-20 ENCOUNTER — Ambulatory Visit (INDEPENDENT_AMBULATORY_CARE_PROVIDER_SITE_OTHER): Payer: Medicaid Other | Admitting: Bariatrics

## 2019-08-21 ENCOUNTER — Encounter: Payer: Self-pay | Admitting: Certified Nurse Midwife

## 2019-08-21 ENCOUNTER — Other Ambulatory Visit (HOSPITAL_COMMUNITY)
Admission: RE | Admit: 2019-08-21 | Discharge: 2019-08-21 | Disposition: A | Discharge status: Home / Self Care | Payer: Medicaid Other | Source: Ambulatory Visit | Attending: Certified Nurse Midwife | Admitting: Certified Nurse Midwife

## 2019-08-21 DIAGNOSIS — Z3480 Encounter for supervision of other normal pregnancy, unspecified trimester: Secondary | ICD-10-CM | POA: Diagnosis not present

## 2019-08-21 DIAGNOSIS — O0990 Supervision of high risk pregnancy, unspecified, unspecified trimester: Secondary | ICD-10-CM | POA: Insufficient documentation

## 2019-08-22 ENCOUNTER — Ambulatory Visit (INDEPENDENT_AMBULATORY_CARE_PROVIDER_SITE_OTHER): Payer: Medicaid Other | Admitting: Certified Nurse Midwife

## 2019-08-22 ENCOUNTER — Other Ambulatory Visit: Payer: Self-pay

## 2019-08-22 VITALS — BP 118/73 | HR 102 | Temp 98.2°F | Wt 254.0 lb

## 2019-08-22 DIAGNOSIS — Z362 Encounter for other antenatal screening follow-up: Secondary | ICD-10-CM

## 2019-08-22 DIAGNOSIS — O99211 Obesity complicating pregnancy, first trimester: Secondary | ICD-10-CM | POA: Diagnosis not present

## 2019-08-22 DIAGNOSIS — Z8759 Personal history of other complications of pregnancy, childbirth and the puerperium: Secondary | ICD-10-CM

## 2019-08-22 DIAGNOSIS — E282 Polycystic ovarian syndrome: Secondary | ICD-10-CM

## 2019-08-22 DIAGNOSIS — Z8659 Personal history of other mental and behavioral disorders: Secondary | ICD-10-CM

## 2019-08-22 DIAGNOSIS — Z3A12 12 weeks gestation of pregnancy: Secondary | ICD-10-CM | POA: Diagnosis not present

## 2019-08-22 DIAGNOSIS — Z3480 Encounter for supervision of other normal pregnancy, unspecified trimester: Secondary | ICD-10-CM

## 2019-08-22 DIAGNOSIS — O99210 Obesity complicating pregnancy, unspecified trimester: Secondary | ICD-10-CM | POA: Insufficient documentation

## 2019-08-22 MED ORDER — BLOOD PRESSURE CUFF MISC: 1.0000 | 0 refills | Status: DC | PRN

## 2019-08-22 NOTE — Progress Notes (Signed)
Bedside U/S shows single IUP with FHT  Of 156 BPM and CRL measures 61.21mm  GA [redacted]w[redacted]d Nipts drawn.

## 2019-08-22 NOTE — Progress Notes (Signed)
Subjective:   [name] is a 34 y.o. G3P2002 at [redacted]w[redacted]d by early ultrasound at 6 weeks being seen today for her first obstetrical visit. This is a surprise pregnancy, but patient and husband are excited. Her obstetrical history is significant for obesity. Patient does intend to breast feed. Pregnancy history fully reviewed.  Patient reports nausea. Patient is currently taking Diclegis, 2 tablets at night and 2 during the day. 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 She reports that she has not had any vomiting for several days. Denies cramping, bleeding, or discharge.  HISTORY: OB History  Gravida Para Term Preterm AB Living  3 2 2  0 0 2  SAB TAB Ectopic Multiple Live Births  0 0 0 0 2    # Outcome Date GA Lbr Len/2nd Weight Sex Delivery Anes PTL Lv  3 Current           2 Term 03/06/15 [redacted]w[redacted]d 06:46 / 00:01 4031 g M Vag-Spont Local  LIV     Birth Comments: Hgb, Normal, FA Newborn Screen Barcode: [id number] Date Collected: 03/08/2015     Apgar1: 4  Apgar5: 9  1 Term 07/17/11 [redacted]w[redacted]d 28:00 / 02:57 3799 g F Vag-Spont EPI  LIV     Birth Comments: None     Name: Mcduffie,GIRL Alyzah     Apgar1: 8  Apgar5: 9   Past Medical History:  Diagnosis Date  . Anxiety   . Depression   . Family history of breast cancer   . Family history of prostate cancer   . Infertility, female    clomid  . Migraines   . Ovarian cyst    dermoid   . PCOS (polycystic ovarian syndrome)   . Stomach problems    (migraines or spasms) since age 57    Past Surgical History:  Procedure Laterality Date  . COLONOSCOPY  2011  . GALLBLADDER SURGERY    . IUD REMOVAL  01/14/2019  . LAPAROSCOPIC OVARIAN CYSTECTOMY Right 12/24/2013   Procedure: LAPAROSCOPIC OVARIAN CYSTECTOMY;  Surgeon: Azalia Bilis, MD;  Location: [facility];  Service: Gynecology;  Laterality: Right;  . ROBOTIC ASSISTED LAPAROSCOPIC OVARIAN CYSTECTOMY Left 01/23/2019   Procedure: XI  ROBOTIC ASSISTED LAPAROSCOPIC LEFT  OVARIAN CYSTECTOMY;  Surgeon: Everitt Amber, MD;  Location: [facility];  Service: Gynecology;  Laterality: Left;  . ROBOTIC ASSISTED SALPINGO OOPHERECTOMY Right 01/23/2019   Procedure: XI ROBOTIC ASSISTED RIGHT SALPINGO OOPHORECTOMY;  Surgeon: Everitt Amber, MD;  Location: [facility];  Service: Gynecology;  Laterality: Right;  . UPPER GI ENDOSCOPY  2011  . WISDOM TOOTH EXTRACTION     Family History  Problem Relation Age of Onset  . Thyroid disease Mother   . Hyperlipidemia Father   . Cancer Father        prostate cancer  . Breast cancer Maternal Grandmother 69       d. 72  . Cancer Maternal Grandmother        bone cancer  . Diabetes Maternal Grandfather   . Breast cancer Other        MGMs sister  . Breast cancer Other        MGMs sister  . Cancer Other        MGMs brother with bone cancer  . Anesthesia problems Neg Hx   . Hypotension Neg Hx   . Malignant hyperthermia Neg Hx   . Pseudochol deficiency Neg Hx    Social History  Tobacco Use  . Smoking status: Never Smoker  . Smokeless tobacco: Never Used  Substance Use Topics  . Alcohol use: No  . Drug use: No   Allergies  Allergen Reactions  . Augmentin [Amoxicillin-Pot Clavulanate] Swelling and Other (See Comments)    Facial swelling  . Penicillins Other (See Comments)    Reaction:  Unknown    Current Outpatient Medications on File Prior to Visit  Medication Sig Dispense Refill  . Doxylamine-Pyridoxine 10-10 MG TBEC 2 tabs q pm x 2 days.  If needed increase to 1 tab in am and 2 tabs at night.  If needed, can increase to 1 tab in am, 1 tab midday and 2 tabs q pm 120 tablet 1  . FLUoxetine (PROZAC) 20 MG capsule Take 1 capsule (20 mg total) by mouth daily. 90 capsule 4  . Nutritional Supplements (JUICE PLUS FIBRE PO) Take 4 each by mouth daily.    . Prenatal Vit-Fe Fumarate-FA (PRENATAL MULTIVITAMIN) TABS tablet Take 1 tablet by mouth daily at 12 noon.    . SENNA PLUS 8.6-50 MG tablet TAKE 2  TABLETS BY MOUTH AT BEDTIME. FOR AFTER SURGERY, DO NOT TAKE IF HAVING LOOSE STOOLS 30 tablet 0   No current facility-administered medications on file prior to visit.    Indications for ASA therapy (per uptodate) One of the following: Previous pregnancy with preeclampsia, especially early onset and with an adverse outcome No Multifetal gestation No Chronic hypertension No Type 1 or 2 diabetes mellitus No Chronic kidney disease No Autoimmune disease (antiphospholipid syndrome, systemic lupus erythematosus) No  Two or more of the following: Nulliparity No Obesity (body mass index >30 kg/m2) Yes Family history of preeclampsia in mother or sister No Age ?35 years No Sociodemographic characteristics (African American race, low socioeconomic level) No Personal risk factors (eg, previous pregnancy with low birth weight or small for gestational age infant, previous adverse pregnancy outcome [eg, stillbirth], interval >10 years between pregnancies) No  Indications for early 1 hour GTT (per uptodate)  BMI >25 (>23 in Asian women) AND one of the following  Gestational diabetes mellitus in a previous pregnancy No Glycated hemoglobin ?5.7 percent (39 mmol/mol), impaired glucose tolerance, or impaired fasting glucose on previous testing No First-degree relative with diabetes No High-risk race/ethnicity (eg, African American, Latino, Native American, [place] American, Pacific Islander) No History of cardiovascular disease No Hypertension or on therapy for hypertension No High-density lipoprotein cholesterol level <35 mg/dL (0.90 mmol/L) and/or a triglyceride level >250 mg/dL (2.82 mmol/L) No Polycystic ovary syndrome Yes Physical inactivity Yes Other clinical condition associated with insulin resistance (eg, severe obesity, acanthosis nigricans) Yes Previous birth of an infant weighing ?4000 g Yes Previous stillbirth of unknown cause No   Exam   Vitals:   08/22/19 0903  BP: 118/73  Pulse:  (!) 102  Temp: 98.2 F (36.8 C)  Weight: 115.2 kg      Uterus:     Pelvic Exam: Perineum: Deferred   Vulva: Deferred   Vagina:  Deferred   Cervix: Deferred; pap smear not obtained    Adnexa: Deferred   Bony Pelvis: Deferred  System: General: well-developed, well-nourished female in no acute distress   Breast:  normal appearance, no masses or tenderness   Skin: normal coloration and turgor, no rashes   Neurologic: oriented, normal, negative, normal mood   Extremities: normal strength, tone, and muscle mass, ROM of all joints is normal   HEENT PERRLA, extraocular movement intact and sclera clear, anicteric   Mouth/Teeth  mucous membranes moist, pharynx normal without lesions and dental hygiene good   Neck supple and no masses   Cardiovascular: regular rate and rhythm   Respiratory:  no respiratory distress, normal breath sounds   Abdomen: soft, non-tender; bowel sounds normal; no masses,  no organomegaly     Assessment:   Pregnancy: [id number] Patient Active Problem List   Diagnosis Date Noted  . Obesity affecting pregnancy 08/22/2019  . Supervision of other normal pregnancy, antepartum 08/21/2019  . COVID-19 affecting pregnancy in first trimester 08/04/2019  . History of right salpingo-oophorectomy 07/11/2019  . Family history of breast cancer   . Family history of prostate cancer   . Right ovarian cyst 09/26/2018  . Anxiety 03/06/2015  . 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 Stomach spasm 03/06/2015  . Obesity, Class III, BMI 40-49.9 (morbid obesity) ([place]) 02/11/2014  . 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 Migraines   . Depression      Plan:  1. Supervision of other normal pregnancy, antepartum - Patient here for new OB. Pt doing well. Pt still in shock about pregnancy, but excited! - Reports some nausea. Recommend adding Pepcid daily. Call if symptoms worsen.  - Prefers midwives. Interested in water birth. Discussed that water birth not  available currently due to COVID.  - Needs AFP at next visit.   - Obstetric panel - HIV antibody (with reflex) - Culture, OB Urine - HgB A1c - Urine cytology ancillary only([facility]) - Babyscripts Schedule Optimization - Panorama or [facility] - [place] MFM OB COMP + 14 WK; Future - Blood Pressure Monitoring (BLOOD PRESSURE CUFF) MISC; 1 Device by Does not apply route as needed.  Dispense: 1 each; Refill: 0  2. Obesity affecting pregnancy in first trimester - Pregravid BMI 44 - Early A1C ordered - Plan for serial growth ultrasounds in 3rd trimester  3. PCOS (polycystic ovarian syndrome) - History of PCOS. Not on meds - A1c ordered  4. History of postpartum depression - Pt on Prozac.  - Pt mood stable. No SI/HI.    Initial labs drawn. Early 1 hour GTT: drawn today Continue prenatal vitamins. Genetic Screening discussed, NIPS: ordered. Ultrasound discussed; fetal anatomic survey: ordered. Problem list reviewed and updated The nature of [place] for [employer] with multiple MDs and other [facility] Providers was explained to patient; also emphasized that fellows, residents, and students are part of our team. Routine obstetric precautions reviewed  Follow up in 4 weeks for routine OB  Renee Harder, SNM 10:09 AM 08/22/19

## 2019-08-24 LAB — URINE CULTURE, OB REFLEX

## 2019-08-24 LAB — CULTURE, OB URINE

## 2019-08-25 ENCOUNTER — Other Ambulatory Visit: Payer: Self-pay | Admitting: *Deleted

## 2019-08-25 ENCOUNTER — Telehealth: Payer: Self-pay | Admitting: *Deleted

## 2019-08-25 LAB — OBSTETRIC PANEL
Absolute Monocytes: 576 cells/uL (ref 200–950)
Antibody Screen: NOT DETECTED
Basophils Absolute: 26 cells/uL (ref 0–200)
Basophils Relative: 0.2 %
Eosinophils Absolute: 92 cells/uL (ref 15–500)
Eosinophils Relative: 0.7 %
HCT: 36.2 % (ref 35.0–45.0)
Hemoglobin: 12 g/dL (ref 11.7–15.5)
Hepatitis B Surface Ag: NONREACTIVE
Lymphs Abs: 3275 cells/uL (ref 850–3900)
MCH: 27.3 pg (ref 27.0–33.0)
MCHC: 33.1 g/dL (ref 32.0–36.0)
MCV: 82.5 fL (ref 80.0–100.0)
MPV: 11.9 fL (ref 7.5–12.5)
Monocytes Relative: 4.4 %
Neutro Abs: 9131 cells/uL — ABNORMAL HIGH (ref 1500–7800)
Neutrophils Relative %: 69.7 %
Platelets: 254 10*3/uL (ref 140–400)
RBC: 4.39 10*6/uL (ref 3.80–5.10)
RDW: 12.8 % (ref 11.0–15.0)
RPR Ser Ql: NONREACTIVE
Rubella: 1.98 Index
Total Lymphocyte: 25 %
WBC: 13.1 10*3/uL — ABNORMAL HIGH (ref 3.8–10.8)

## 2019-08-25 LAB — URINE CYTOLOGY ANCILLARY ONLY
Chlamydia: NEGATIVE
Comment: NEGATIVE
Comment: NORMAL
Neisseria Gonorrhea: NEGATIVE

## 2019-08-25 LAB — HEMOGLOBIN A1C
Hgb A1c MFr Bld: 5.7 % of total Hgb — ABNORMAL HIGH (ref <5.7)
Mean Plasma Glucose: 117 (calc)
eAG (mmol/L): 6.5 (calc)

## 2019-08-25 LAB — HIV ANTIBODY (ROUTINE TESTING W REFLEX): HIV 1&2 Ab, 4th Generation: NONREACTIVE

## 2019-08-25 MED ORDER — DOXYLAMINE-PYRIDOXINE 10-10 MG PO TBEC: 2.0000 | DELAYED_RELEASE_TABLET | Freq: Two times a day (BID) | ORAL | 6 refills | Status: DC

## 2019-08-25 NOTE — Telephone Encounter (Signed)
Pt called in and stated she had an appt on Frday and forgot to ask for a prescription for Diclegis and pt states she needs the max of 120 sent because she takes the maximum amount, to [facility] [employer] in [place], please advise.Marland KitchenMarland KitchenMarland Kitchen

## 2019-08-26 ENCOUNTER — Encounter: Payer: Self-pay | Admitting: *Deleted

## 2019-09-01 ENCOUNTER — Ambulatory Visit (INDEPENDENT_AMBULATORY_CARE_PROVIDER_SITE_OTHER): Payer: Medicaid Other

## 2019-09-01 ENCOUNTER — Ambulatory Visit (INDEPENDENT_AMBULATORY_CARE_PROVIDER_SITE_OTHER): Payer: Medicaid Other | Admitting: Family Medicine

## 2019-09-01 ENCOUNTER — Other Ambulatory Visit: Payer: Self-pay

## 2019-09-01 DIAGNOSIS — Z3480 Encounter for supervision of other normal pregnancy, unspecified trimester: Secondary | ICD-10-CM

## 2019-09-01 DIAGNOSIS — Z3482 Encounter for supervision of other normal pregnancy, second trimester: Secondary | ICD-10-CM | POA: Diagnosis not present

## 2019-09-01 NOTE — Progress Notes (Signed)
Pt here for Panorama.

## 2019-09-03 ENCOUNTER — Ambulatory Visit (INDEPENDENT_AMBULATORY_CARE_PROVIDER_SITE_OTHER): Payer: Medicaid Other | Admitting: Bariatrics

## 2019-09-09 ENCOUNTER — Other Ambulatory Visit: Payer: Self-pay | Admitting: *Deleted

## 2019-09-15 ENCOUNTER — Other Ambulatory Visit (INDEPENDENT_AMBULATORY_CARE_PROVIDER_SITE_OTHER): Payer: Medicaid Other

## 2019-09-15 ENCOUNTER — Other Ambulatory Visit: Payer: Self-pay

## 2019-09-15 ENCOUNTER — Encounter: Payer: Self-pay | Admitting: *Deleted

## 2019-09-15 DIAGNOSIS — Z3480 Encounter for supervision of other normal pregnancy, unspecified trimester: Secondary | ICD-10-CM

## 2019-09-15 NOTE — Progress Notes (Signed)
Pt sent to lab for 2 hour GTT

## 2019-09-16 ENCOUNTER — Encounter: Payer: Self-pay | Admitting: *Deleted

## 2019-09-16 ENCOUNTER — Telehealth: Payer: Self-pay | Admitting: *Deleted

## 2019-09-16 DIAGNOSIS — O24419 Gestational diabetes mellitus in pregnancy, unspecified control: Secondary | ICD-10-CM

## 2019-09-16 HISTORY — DX: Gestational diabetes mellitus in pregnancy, unspecified control: O24.419

## 2019-09-16 LAB — 2HR GTT W 1 HR, CARPENTER, 75 G
Glucose, 1 Hr, Gest: 212 mg/dL — ABNORMAL HIGH (ref 65–179)
Glucose, 2 Hr, Gest: 193 mg/dL — ABNORMAL HIGH (ref 65–152)
Glucose, Fasting, Gest: 120 mg/dL — ABNORMAL HIGH (ref 65–91)

## 2019-09-16 NOTE — Telephone Encounter (Signed)
Pt notified of failed 2 hr GTT and Nutrition and Diabetes referral placed.  Her 2nd Panorama came back Low fetal fraction.  Pt does have an in office visit here on Friday.

## 2019-09-17 ENCOUNTER — Ambulatory Visit (INDEPENDENT_AMBULATORY_CARE_PROVIDER_SITE_OTHER): Payer: Medicaid Other | Admitting: Bariatrics

## 2019-09-17 ENCOUNTER — Ambulatory Visit (INDEPENDENT_AMBULATORY_CARE_PROVIDER_SITE_OTHER): Payer: Medicaid Other | Admitting: Family Medicine

## 2019-09-19 ENCOUNTER — Ambulatory Visit (INDEPENDENT_AMBULATORY_CARE_PROVIDER_SITE_OTHER): Payer: Medicaid Other | Admitting: Obstetrics and Gynecology

## 2019-09-19 ENCOUNTER — Other Ambulatory Visit: Payer: Self-pay

## 2019-09-19 VITALS — BP 126/84 | HR 104 | Temp 98.3°F | Wt 255.0 lb

## 2019-09-19 DIAGNOSIS — Z3480 Encounter for supervision of other normal pregnancy, unspecified trimester: Secondary | ICD-10-CM

## 2019-09-19 DIAGNOSIS — O24410 Gestational diabetes mellitus in pregnancy, diet controlled: Secondary | ICD-10-CM

## 2019-09-19 DIAGNOSIS — Z3A16 16 weeks gestation of pregnancy: Secondary | ICD-10-CM

## 2019-09-19 MED ORDER — METOCLOPRAMIDE HCL 10 MG PO TABS: 10.0000 mg | ORAL_TABLET | Freq: Three times a day (TID) | ORAL | 0 refills | Status: DC

## 2019-09-19 NOTE — Progress Notes (Signed)
   PRENATAL VISIT NOTE  Subjective:  [name] is a 34 y.o. G3P2002 at [redacted]w[redacted]d being seen today for ongoing prenatal care.  She is currently monitored for the following issues for this high-risk pregnancy and has Migraines; Depression; Carcinoid tumor of ovary; Mature teratoma; Obesity, Class III, BMI 40-49.9 (morbid obesity) ([place]); Anxiety; Female infertility; Stomach spasm; Right ovarian cyst; Family history of breast cancer; Family history of prostate cancer; History of right salpingo-oophorectomy; COVID-19 affecting pregnancy in first trimester; Supervision of other normal pregnancy, antepartum; Obesity affecting pregnancy; History of postpartum depression; and Gestational diabetes on their problem list.  Patient reports Concerned about diabetes diagnosis. .   Krista Blue. Bleeding: None.  Movement: Present. Denies leaking of fluid.   The following portions of the patient's history were reviewed and updated as appropriate: allergies, current medications, past family history, past medical history, past social history, past surgical history and problem list.   Objective:   Vitals:   09/19/19 0958  BP: 126/84  Pulse: (!) 104  Temp: 98.3 F (36.8 C)  Weight: 255 lb (115.7 kg)    Fetal Status: Fetal Heart Rate (bpm): 153   Movement: Present     General:  Alert, oriented and cooperative. Patient is in no acute distress.  Skin: Skin is warm and dry. No rash noted.   Cardiovascular: Normal heart rate noted  Respiratory: Normal respiratory effort, no problems with respiration noted  Abdomen: Soft, gravid, appropriate for gestational age.  Pain/Pressure: Absent     Pelvic: Cervical exam deferred        Extremities: Normal range of motion.  Edema: None  Mental Status: Normal mood and affect. Normal behavior. Normal judgment and thought content.   Assessment and Plan:  Pregnancy: G3P2002 at [redacted]w[redacted]d  1. Supervision of other normal pregnancy, antepartum  - AFP, Quad Screen  2. Diet  controlled gestational diabetes mellitus (GDM) in second trimester  - supplies ordered - Keep nutrition visit.  - Discussed baby ASA daily in detail   Preterm labor symptoms and general obstetric precautions including but not limited to vaginal bleeding, contractions, leaking of fluid and fetal movement were reviewed in detail with the patient. Please refer to After Visit Summary for other counseling recommendations.   Return in about 4 weeks (around 10/17/2019) for virtual visit .  Future Appointments  Date Time Provider [place]  09/30/2019  9:15 AM [facility] [place]  10/10/2019 12:30 PM [place] 1 [facility]  10/17/2019  9:10 AM Julianne Handler, CNM [facility]  12/22/2019  2:00 PM Megan Salon, MD [place] None    Noni Saupe, NP

## 2019-09-22 LAB — AFP, QUAD SCREEN
AFP: 15.1 ng/mL
Age Alone: 1
Curr Gest Age: 16 weeks
Down Syndrome Scr Risk Est: <1
HCG, Total: 14.84 IU/mL
INH: 103 pg/mL
Maternal Wt: 260 [lb_av]
MoM for AFP: 0.64
MoM for INH: 0.85
MoM for hCG: 0.57
Osb Risk: <1
Trisomy 18 (Edward) Syndrome Interp.: <1
Twins-AFP: 1
uE3 Mom: 1.39
uE3 Value: 1.01 ng/mL

## 2019-09-22 MED ORDER — ACCU-CHEK GUIDE VI STRP: ORAL_STRIP | 12 refills | Status: DC

## 2019-09-22 MED ORDER — ACCU-CHEK GUIDE W/DEVICE KIT: 1.0000 | PACK | Freq: Once | 0 refills | Status: AC

## 2019-09-22 MED ORDER — ACCU-CHEK FASTCLIX LANCETS MISC: 1.0000 | Freq: Four times a day (QID) | 12 refills | Status: DC

## 2019-09-23 ENCOUNTER — Other Ambulatory Visit: Payer: Medicaid Other

## 2019-09-30 ENCOUNTER — Other Ambulatory Visit: Payer: Self-pay

## 2019-09-30 ENCOUNTER — Encounter: Payer: Medicaid Other | Attending: Obstetrics and Gynecology | Admitting: Registered"

## 2019-09-30 ENCOUNTER — Ambulatory Visit: Payer: Medicaid Other | Admitting: Registered"

## 2019-09-30 DIAGNOSIS — Z3A00 Weeks of gestation of pregnancy not specified: Secondary | ICD-10-CM | POA: Insufficient documentation

## 2019-09-30 DIAGNOSIS — O99210 Obesity complicating pregnancy, unspecified trimester: Secondary | ICD-10-CM | POA: Insufficient documentation

## 2019-09-30 DIAGNOSIS — O24419 Gestational diabetes mellitus in pregnancy, unspecified control: Secondary | ICD-10-CM | POA: Insufficient documentation

## 2019-09-30 NOTE — Progress Notes (Signed)
Patient was seen on 09/30/19 for Gestational Diabetes self-management. EDD 03/05/20. Patient states no prior history of GDM. Pt reports last 2 pregnancies using Clomid to address fertility issues due to PCOS. Diet history obtained. Patient eats variety of all food groups. Beverages include water, unsweet tea, sparkling water.  Patient is not likely consuming excess carbohydrates.   The following learning objectives were met by the patient :   States the definition of Gestational Diabetes  States why dietary management is important in controlling blood glucose  Describes the effects of carbohydrates on blood glucose levels  Demonstrates ability to create a balanced meal plan  Demonstrates carbohydrate counting   States when to check blood glucose levels  Demonstrates proper blood glucose monitoring techniques  States the effect of stress and exercise on blood glucose levels  States the importance of limiting caffeine and abstaining from alcohol and smoking  Plan:  Aim for 3 Carbohydrate Choices per meal (45 grams) +/- 1 either way  Aim for 1-2 Carbohydrate Choices per snack Begin reading food labels for Total Carbohydrate of foods If OK with your MD, consider  increasing your activity level by walking, Arm Chair Exercises or other activity daily as tolerated Begin checking Blood Glucose before breakfast and 2 hours after first bite of breakfast, lunch and dinner as directed by MD  Bring Log Book/Sheet and meter to every medical appointment OR use Baby Scripts (see below) Baby Scripts: Patient was introduced to [employer] and plans to use as record of blood glucose electronically Take medication if directed by MD  Patient already has a meter and is testing pre breakfast and 1-2 hours after each meal. Review of Log Book shows: FBS 94-109; 1-2 hrs post prandial 3x out of range in last week.  Patient instructed to monitor glucose levels: FBS: 60 - 95 mg/dl 2 hour: <120  mg/dl  Patient received the following handouts:  Nutrition Diabetes and Pregnancy  Carbohydrate Counting List  Blood glucose Log Sheet  Patient will be seen for follow-up as needed.

## 2019-10-01 ENCOUNTER — Ambulatory Visit (INDEPENDENT_AMBULATORY_CARE_PROVIDER_SITE_OTHER): Payer: Medicaid Other | Admitting: Family Medicine

## 2019-10-03 ENCOUNTER — Other Ambulatory Visit: Payer: Self-pay | Admitting: *Deleted

## 2019-10-03 DIAGNOSIS — O24419 Gestational diabetes mellitus in pregnancy, unspecified control: Secondary | ICD-10-CM

## 2019-10-07 ENCOUNTER — Other Ambulatory Visit: Payer: Medicaid Other

## 2019-10-08 ENCOUNTER — Other Ambulatory Visit: Payer: Self-pay

## 2019-10-10 ENCOUNTER — Other Ambulatory Visit (HOSPITAL_COMMUNITY): Payer: Self-pay | Admitting: *Deleted

## 2019-10-10 ENCOUNTER — Other Ambulatory Visit: Payer: Self-pay | Admitting: Certified Nurse Midwife

## 2019-10-10 ENCOUNTER — Other Ambulatory Visit: Payer: Self-pay

## 2019-10-10 ENCOUNTER — Ambulatory Visit (HOSPITAL_COMMUNITY)
Admission: RE | Admit: 2019-10-10 | Discharge: 2019-10-10 | Disposition: A | Discharge status: Home / Self Care | Payer: Medicaid Other | Source: Ambulatory Visit | Attending: Obstetrics and Gynecology | Admitting: Obstetrics and Gynecology

## 2019-10-10 DIAGNOSIS — O99210 Obesity complicating pregnancy, unspecified trimester: Secondary | ICD-10-CM

## 2019-10-10 DIAGNOSIS — O99212 Obesity complicating pregnancy, second trimester: Secondary | ICD-10-CM | POA: Diagnosis not present

## 2019-10-10 DIAGNOSIS — Z3A19 19 weeks gestation of pregnancy: Secondary | ICD-10-CM

## 2019-10-10 DIAGNOSIS — Z3480 Encounter for supervision of other normal pregnancy, unspecified trimester: Secondary | ICD-10-CM

## 2019-10-10 DIAGNOSIS — O24410 Gestational diabetes mellitus in pregnancy, diet controlled: Secondary | ICD-10-CM | POA: Diagnosis not present

## 2019-10-17 ENCOUNTER — Telehealth (INDEPENDENT_AMBULATORY_CARE_PROVIDER_SITE_OTHER): Payer: Medicaid Other | Admitting: Certified Nurse Midwife

## 2019-10-17 DIAGNOSIS — O24410 Gestational diabetes mellitus in pregnancy, diet controlled: Secondary | ICD-10-CM

## 2019-10-17 DIAGNOSIS — O99210 Obesity complicating pregnancy, unspecified trimester: Secondary | ICD-10-CM

## 2019-10-17 DIAGNOSIS — O99212 Obesity complicating pregnancy, second trimester: Secondary | ICD-10-CM

## 2019-10-17 DIAGNOSIS — Z3480 Encounter for supervision of other normal pregnancy, unspecified trimester: Secondary | ICD-10-CM

## 2019-10-17 DIAGNOSIS — Z8659 Personal history of other mental and behavioral disorders: Secondary | ICD-10-CM

## 2019-10-17 DIAGNOSIS — Z8759 Personal history of other complications of pregnancy, childbirth and the puerperium: Secondary | ICD-10-CM

## 2019-10-17 DIAGNOSIS — Z3A20 20 weeks gestation of pregnancy: Secondary | ICD-10-CM

## 2019-10-17 NOTE — Progress Notes (Signed)
[place] VIRTUAL VIDEO VISIT ENCOUNTER NOTE  Provider location: [facility] at [place]   I connected with [name] on 10/17/19 at  9:10 AM EST by MyChart Video Encounter at home and verified that I am speaking with the correct person using two identifiers.   I discussed the limitations, risks, security and privacy concerns of performing an evaluation and management service virtually and the availability of in person appointments. I also discussed with the patient that there may be a patient responsible charge related to this service. The patient expressed understanding and agreed to proceed. Subjective:  [name] is a 34 y.o. G3P2002 at [redacted]w[redacted]d being seen today for ongoing prenatal care.  She is currently monitored for the following issues for this high-risk pregnancy and has Migraines; Depression; Carcinoid tumor of ovary; Mature teratoma; Obesity, Class III, BMI 40-49.9 (morbid obesity) ([place]); Anxiety; Female infertility; Stomach spasm; Right ovarian cyst; Family history of breast cancer; Family history of prostate cancer; History of right salpingo-oophorectomy; COVID-19 affecting pregnancy in first trimester; Supervision of other normal pregnancy, antepartum; Obesity affecting pregnancy; History of postpartum depression; and Gestational diabetes on their problem list.  Patient reports nausea and vomiting. Has symptoms most days. She is using Diclegis and Pepcid with some relief. She tried Reglan but didn't like the way it made her feel.   .  .   . Denies any leaking of fluid.   The following portions of the patient's history were reviewed and updated as appropriate: allergies, current medications, past family history, past medical history, past social history, past surgical history and problem list.   Objective:   Vitals:   10/17/19 0910  BP: 126/73  Weight: 259 lb (117.5 kg)    Fetal Status:           General:  Alert, oriented  and cooperative. Patient is in no acute distress.  Respiratory: Normal respiratory effort, no problems with respiration noted  Mental Status: Normal mood and affect. Normal behavior. Normal judgment and thought content.  Rest of physical exam deferred due to type of encounter  Imaging: [place] MFM OB DETAIL +14 WK  Result Date: 10/10/2019 ----------------------------------------------------------------------  OBSTETRICS REPORT                       (Signed Final 10/10/2019 02:13 pm) ---------------------------------------------------------------------- Patient Info  ID #:       [id number]                          D.O.B.:  04-Jan-1986 (34 yrs)  Name:       [name]                Visit Date: 10/10/2019 12:35 pm ---------------------------------------------------------------------- Performed By  Performed By:     Jacob Moores BS,       Ref. Address:     [place], [address]                                                             [place], [place]  Attending:        Christy Sartorius  Annamaria Boots MD         [facility]  Referred By:      [facility] ---------------------------------------------------------------------- Orders   #  Description                          Code         Ordered By   1  [place] MFM OB DETAIL +14 WK              76811.01     Zeola Brys  ----------------------------------------------------------------------   #  Order #                    Accession #                 Episode #   1  [id number]                  [id number]                  [id number]  ---------------------------------------------------------------------- Indications   Gestational diabetes in pregnancy, diet        O24.410   controlled   [redacted] weeks gestation of pregnancy                Z3A.19   Maternal morbid obesity                        O99.210 E66.01   Encounter for antenatal screening for          Z36.3    malformations  ---------------------------------------------------------------------- Fetal Evaluation  Num Of Fetuses:         1  Fetal Heart Rate(bpm):  161  Cardiac Activity:       Observed  Presentation:           Cephalic  Placenta:               Anterior  P. Cord Insertion:      Visualized, central  Amniotic Fluid  AFI FV:      Within normal limits                              Largest Pocket(cm)                              6.04 ---------------------------------------------------------------------- Biometry  BPD:      46.2  mm     G. Age:  20w 0d         86  %    CI:        77.94   %    70 - 86  FL/HC:      17.5   %    16.1 - 18.3  HC:      165.6  mm     G. Age:  19w 2d         56  %    HC/AC:      1.10        1.09 - 1.39  AC:      151.1  mm     G. Age:  20w 2d         86  %    FL/BPD:     62.8   %  FL:         29  mm     G. Age:  18w 6d         40  %    FL/AC:      19.2   %    20 - 24  HUM:      30.7  mm     G. Age:  20w 1d         80  %  CER:      19.9  mm     G. Age:  19w 0d         49  %  NFT:       4.9  mm  LV:        8.1  mm  CM:          4  mm  Est. FW:     305  gm    0 lb 11 oz      83  % ---------------------------------------------------------------------- OB History  Blood Type:    A+  Gravidity:    3         Term:   2        Prem:   0        SAB:   0  TOP:          0       Ectopic:  0        Living: 2 ---------------------------------------------------------------------- Gestational Age  LMP:           19w 6d        Date:  05/24/19                 EDD:   02/28/20  U/S Today:     19w 4d                                        EDD:   03/01/20  Best:          19w 0d     Det. ByLoman Chroman         EDD:   03/05/20                                      (07/15/19) ---------------------------------------------------------------------- Anatomy  Cranium:               Appears normal         LVOT:                   Not well visualized  Cavum:  Appears normal         Aortic Arch:            Appears normal  Ventricles:            Appears normal         Ductal Arch:            Not well visualized  Choroid Plexus:        Appears normal         Diaphragm:              Appears normal  Cerebellum:            Appears normal         Stomach:                Appears normal, left                                                                        sided  Posterior Fossa:       Appears normal         Abdomen:                Appears normal  Nuchal Fold:           Appears normal         Abdominal Wall:         Appears nml (cord                                                                        insert, abd wall)  Face:                  Appears normal         Cord Vessels:           Appears normal (3                         (orbits and profile)                           vessel cord)  Lips:                  Not well visualized    Kidneys:                Appear normal  Palate:                Not well visualized    Bladder:                Appears normal  Thoracic:              Appears normal         Spine:                  Limited views  appear normal  Heart:                 Appears normal         Upper Extremities:      Visualized                         (4CH, axis, and                         situs)  RVOT:                  Not well visualized    Lower Extremities:      Appears normal  Other:  Nasal bone visualized. Heels visualized. Technically difficult due to          maternal habitus and fetal position. ---------------------------------------------------------------------- Cervix Uterus Adnexa  Cervix  Length:           5.04  cm.  Normal appearance by transabdominal scan.  Uterus  No abnormality visualized.  Left Ovary  Within normal limits. No adnexal mass visualized.  Right Ovary  Surgically removed. No adnexal mass visualized.  Cul De Sac  No free fluid seen.  Adnexa  No abnormality  visualized. ---------------------------------------------------------------------- Comments  This patient was seen for a detailed fetal anatomy scan due  to maternal obesity.  She reports that she was recently  diagnosed with early onset gestational diabetes that is  currently controlled with diet modification.  The patient had a cell free DNA test drawn earlier in her  pregnancy which showed insufficient fetal DNA.  She was informed that the fetal growth and amniotic fluid  level were appropriate for her gestational age.  Due to extreme maternal body habitus, the views of the fetal  anatomy were suboptimal today.  The patient was informed that anomalies may be missed due  to technical limitations. If the fetus is in a suboptimal position  or maternal habitus is increased, visualization of the fetus in  the maternal uterus may be impaired.  Due to early onset gestational diabetes, the patient was  referred for a fetal echocardiogram with pediatric cardiology.  She was advised that we will continue to follow her with  monthly growth ultrasounds.  A follow-up exam was scheduled in 4 weeks to assess the  fetal growth and to complete the views of the fetal anatomy. ----------------------------------------------------------------------                   Johnell Comings, MD Electronically Signed Final Report   10/10/2019 02:13 pm ----------------------------------------------------------------------   Assessment and Plan:  Pregnancy: [id number] at [redacted]w[redacted]d 1. Diet controlled gestational diabetes mellitus (GDM) in second trimester - early onset GDM, fetal echo recommended by MFM - ECHO FETAL; Future - FBS 93-98, postprandials 99-119 - feels frustrated because she has tried different things to improve her FBS but can't find a solution - her N/V and new recurrence of diminished taste (had loss of taste with Covid 2 mos ago) is making it harder to eat properly and frequently - continue home BG monitoring, avoid long periods  of fasting, and eat small portions often - meds not indicated at this time  2. Supervision of high risk pregnancy  Preterm labor symptoms and general obstetric precautions including but not limited to vaginal bleeding, contractions, leaking of fluid and fetal movement were reviewed in detail with the patient. I discussed the assessment and treatment plan with the patient.  The patient was provided an opportunity to ask questions and all were answered. The patient agreed with the plan and demonstrated an understanding of the instructions. The patient was advised to call back or seek an in-person office evaluation/go to [facility] at [facility] for any urgent or concerning symptoms. Please refer to After Visit Summary for other counseling recommendations.   I provided 17 minutes of face-to-face time during this encounter.  Return in about 4 weeks (around 11/14/2019).  Future Appointments  Date Time Provider [place]  11/10/2019  1:30 PM [place] [facility]  11/10/2019  1:30 PM [place] 1 WH-MFCUS MFC-US  11/14/2019  9:50 AM Gavin Pound, CNM [facility]  12/22/2019  2:00 PM Megan Salon, MD [place] None    Julianne Handler, [place] for [facility], [place]

## 2019-10-24 ENCOUNTER — Encounter: Payer: Self-pay | Admitting: Certified Nurse Midwife

## 2019-11-10 ENCOUNTER — Ambulatory Visit (HOSPITAL_COMMUNITY)
Admission: RE | Admit: 2019-11-10 | Discharge: 2019-11-10 | Disposition: A | Discharge status: Home / Self Care | Payer: Medicaid Other | Source: Ambulatory Visit | Attending: Obstetrics and Gynecology | Admitting: Obstetrics and Gynecology

## 2019-11-10 ENCOUNTER — Other Ambulatory Visit: Payer: Self-pay

## 2019-11-10 ENCOUNTER — Ambulatory Visit (HOSPITAL_COMMUNITY): Payer: Medicaid Other | Admitting: *Deleted

## 2019-11-10 ENCOUNTER — Other Ambulatory Visit (HOSPITAL_COMMUNITY): Payer: Self-pay | Admitting: *Deleted

## 2019-11-10 ENCOUNTER — Encounter (HOSPITAL_COMMUNITY): Payer: Self-pay

## 2019-11-10 DIAGNOSIS — Z362 Encounter for other antenatal screening follow-up: Secondary | ICD-10-CM

## 2019-11-10 DIAGNOSIS — O99210 Obesity complicating pregnancy, unspecified trimester: Secondary | ICD-10-CM | POA: Insufficient documentation

## 2019-11-10 DIAGNOSIS — Z3480 Encounter for supervision of other normal pregnancy, unspecified trimester: Secondary | ICD-10-CM | POA: Diagnosis present

## 2019-11-10 DIAGNOSIS — O24410 Gestational diabetes mellitus in pregnancy, diet controlled: Secondary | ICD-10-CM

## 2019-11-10 DIAGNOSIS — Z8659 Personal history of other mental and behavioral disorders: Secondary | ICD-10-CM

## 2019-11-10 DIAGNOSIS — O99212 Obesity complicating pregnancy, second trimester: Secondary | ICD-10-CM | POA: Diagnosis present

## 2019-11-10 DIAGNOSIS — Z8759 Personal history of other complications of pregnancy, childbirth and the puerperium: Secondary | ICD-10-CM | POA: Diagnosis present

## 2019-11-10 DIAGNOSIS — Z3A23 23 weeks gestation of pregnancy: Secondary | ICD-10-CM

## 2019-11-14 ENCOUNTER — Telehealth (INDEPENDENT_AMBULATORY_CARE_PROVIDER_SITE_OTHER): Payer: Medicaid Other

## 2019-11-14 DIAGNOSIS — O24410 Gestational diabetes mellitus in pregnancy, diet controlled: Secondary | ICD-10-CM | POA: Diagnosis not present

## 2019-11-14 DIAGNOSIS — Z3480 Encounter for supervision of other normal pregnancy, unspecified trimester: Secondary | ICD-10-CM | POA: Diagnosis not present

## 2019-11-14 NOTE — Progress Notes (Signed)
I connected with@ on 11/14/19 at  9:50 AM EDT by: mychart and verified that I am speaking with the correct person using two identifiers.  Patient is located at home and provider is located at [employer].     The purpose of this virtual visit is to provide medical care while limiting exposure to the novel coronavirus. I discussed the limitations, risks, security and privacy concerns of performing an evaluation and management service by [employer] virtual visit and the availability of in person appointments. I also discussed with the patient that there may be a patient responsible charge related to this service. By engaging in this virtual visit, you consent to the provision of healthcare.  Additionally, you authorize for your insurance to be billed for the services provided during this visit.  The patient expressed understanding and agreed to proceed.  The following staff members participated in the virtual visit:  J.Annalena Piatt, CNM    PRENATAL VISIT NOTE  Subjective:  [name] is a 34 y.o. G3P2002 at [redacted]w[redacted]d  for phone visit for ongoing prenatal care.  She is currently monitored for the following issues for this high-risk pregnancy and has Migraines; Depression; Carcinoid tumor of ovary; Mature teratoma; Obesity, Class III, BMI 40-49.9 (morbid obesity) ([place]); Anxiety; Female infertility; Stomach spasm; Right ovarian cyst; Family history of breast cancer; Family history of prostate cancer; History of right salpingo-oophorectomy; COVID-19 affecting pregnancy in first trimester; Supervision of other normal pregnancy, antepartum; Obesity affecting pregnancy; History of postpartum depression; and Gestational diabetes on their problem list.  Patient reports abdominal cramping. She reports it occurs after "a day when I am on my feet more" or long walks.  Patient reports the pain lasts 30 minutes and resolves with rest and drinking water.  Patient reports it occurs 1-2x per week.  Patient reports good fetal  movement and denies vaginal concerns.  She also denies constipation or diarrhea as well as pain or difficulty with urination.   Contractions: Not present. Vag. Bleeding: None.  Movement: Present. Denies leaking of fluid.   The following portions of the patient's history were reviewed and updated as appropriate: allergies, current medications, past family history, past medical history, past social history, past surgical history and problem list.   Objective:   Vitals:   11/14/19 0924 11/14/19 0933  BP:  121/76  Pulse:  92  Weight: 249 lb (112.9 kg) 249 lb (112.9 kg)   Self-Obtained  Fetal Status:     Movement: Present     Assessment and Plan:  Pregnancy: G3P2002 at [redacted]w[redacted]d 1. Diet controlled gestational diabetes mellitus (GDM) in second trimester -BS reviewed. Informed that two slightly abnormal fastings at 99, 96 and one PP dinner at 126. -Encouraged to monitor intake and make notes of foods that cause elevations. -Reassured that findings overall good.   2. Supervision of other normal pregnancy, antepartum -Reviewed recent US from 4/5-Normal; Fetus at 77%ile. -MFM recommends fetal echo and scheduled for April 14 per note. -Will plan for return virtual visit in 2 weeks to discuss Echo results and address any questions.  Preterm labor symptoms and general obstetric precautions including but not limited to vaginal bleeding, contractions, leaking of fluid and fetal movement were reviewed in detail with the patient.  No follow-ups on file.  Future Appointments  Date Time Provider [place]  11/14/2019  9:50 AM Gavin Pound, CNM [facility]  12/08/2019 10:45 AM [place] NURSE [place] [facility]-US  12/08/2019 10:45 AM [place] 5 WH-[facility] M[facility]-US  12/22/2019  2:00 PM [place],  Lemmie Evens, MD [place] None     Time spent on virtual visit: 8 minutes  Maryann Conners, CNM

## 2019-12-01 ENCOUNTER — Encounter: Payer: Self-pay | Admitting: Obstetrics & Gynecology

## 2019-12-01 ENCOUNTER — Telehealth (INDEPENDENT_AMBULATORY_CARE_PROVIDER_SITE_OTHER): Payer: Medicaid Other | Admitting: Obstetrics & Gynecology

## 2019-12-01 DIAGNOSIS — Z8659 Personal history of other mental and behavioral disorders: Secondary | ICD-10-CM

## 2019-12-01 DIAGNOSIS — O24410 Gestational diabetes mellitus in pregnancy, diet controlled: Secondary | ICD-10-CM | POA: Diagnosis not present

## 2019-12-01 DIAGNOSIS — Z8759 Personal history of other complications of pregnancy, childbirth and the puerperium: Secondary | ICD-10-CM

## 2019-12-01 DIAGNOSIS — Z3480 Encounter for supervision of other normal pregnancy, unspecified trimester: Secondary | ICD-10-CM

## 2019-12-01 DIAGNOSIS — O99212 Obesity complicating pregnancy, second trimester: Secondary | ICD-10-CM | POA: Diagnosis not present

## 2019-12-01 MED ORDER — INSULIN NPH (HUMAN) (ISOPHANE) 100 UNIT/ML ~~LOC~~ SUSP: SUBCUTANEOUS | 3 refills | Status: DC

## 2019-12-01 MED ORDER — METFORMIN HCL 500 MG PO TABS: 500.0000 mg | ORAL_TABLET | Freq: Every day | ORAL | 6 refills | Status: DC

## 2019-12-01 NOTE — Addendum Note (Signed)
Addended by: Asencion Islam on: 12/01/2019 04:55 PM   Modules accepted: Orders

## 2019-12-01 NOTE — Progress Notes (Addendum)
I connected with@ on 12/01/19 at  2:15 PM EDT by: mychart and verified that I am speaking with the correct person using two identifiers.  Patient is located at home and provider is located at [place].     The purpose of this virtual visit is to provide medical care while limiting exposure to the novel coronavirus. I discussed the limitations, risks, security and privacy concerns of performing an evaluation and management service by mychart and the availability of in person appointments. I also discussed with the patient that there may be a patient responsible charge related to this service. By engaging in this virtual visit, you consent to the provision of healthcare.  Additionally, you authorize for your insurance to be billed for the services provided during this visit.  The patient expressed understanding and agreed to proceed.     PRENATAL VISIT NOTE  Subjective:  [name] is a 34 y.o. G3P2002 at [redacted]w[redacted]d  for phone visit for ongoing prenatal care.  She is currently monitored for the following issues for this high-risk pregnancy and has Migraines; Depression; Carcinoid tumor of ovary; Mature teratoma; Obesity, Class III, BMI 40-49.9 (morbid obesity) ([place]); Anxiety; Female infertility; Right ovarian cyst; Family history of breast cancer; Family history of prostate cancer; History of right salpingo-oophorectomy; COVID-19 affecting pregnancy in first trimester; Supervision of other normal pregnancy, antepartum; Obesity affecting pregnancy; History of postpartum depression; and Gestational diabetes on their problem list.  Patient reports stomach issues; no vomiting x2 weeks.  Contractions: Not present. Vag. Bleeding: None.  Movement: Present. Denies leaking of fluid.   The following portions of the patient's history were reviewed and updated as appropriate: allergies, current medications, past family history, past medical history, past social history, past surgical history and problem list.     Objective:   Vitals:   12/01/19 1151  BP: 123/70  Pulse: 95  Weight: 246 lb (111.6 kg)   Self-Obtained  Fetal Status:     Movement: Present     Assessment and Plan:  Pregnancy: G3P2002 at [redacted]w[redacted]d 1. Gestational diabetes mellitus (GDM) in second trimester Most fastings >95; will add NPH in the evenings.  6 units per algorithm using .9 as the multiplier.  PP are normal - Referral to [facility] - Awaiting on fetal echo results from [facility].  Pt states it was normal - Serial growth [place] and antenatal testing per protocol.  2.  Skin tags or lesion Pt has new skin tag appearing lesions near mons.  Will have in person visit next time to review  Preterm labor symptoms and general obstetric precautions including but not limited to vaginal bleeding, contractions, leaking of fluid and fetal movement were reviewed in detail with the patient.  No follow-ups on file.  Future Appointments  Date Time Provider [place]  12/08/2019 10:45 AM [place] [facility]-US  12/08/2019 10:45 AM [facility] [place] 5 WH-[facility]FC[facility] MFC-US  12/22/2019  2:00 PM Megan Salon, MD [place] None  12/26/2019  9:50 AM Julianne Handler, CNM [facility]     Time spent on virtual visit: 18 minutes  ADDENDUM: Pt sent message and would like to start metformin over insulin.  She is aware of the GI side effects of metformin.  She will not start the insulin and will start 500 mg metfo min QHS.  Silas [place], MD

## 2019-12-08 ENCOUNTER — Other Ambulatory Visit: Payer: Self-pay | Admitting: *Deleted

## 2019-12-08 ENCOUNTER — Ambulatory Visit: Payer: Medicaid Other | Admitting: *Deleted

## 2019-12-08 ENCOUNTER — Other Ambulatory Visit: Payer: Self-pay

## 2019-12-08 ENCOUNTER — Ambulatory Visit (HOSPITAL_COMMUNITY): Payer: Medicaid Other | Attending: Obstetrics and Gynecology

## 2019-12-08 DIAGNOSIS — Z3A27 27 weeks gestation of pregnancy: Secondary | ICD-10-CM | POA: Diagnosis not present

## 2019-12-08 DIAGNOSIS — O24415 Gestational diabetes mellitus in pregnancy, controlled by oral hypoglycemic drugs: Secondary | ICD-10-CM | POA: Diagnosis not present

## 2019-12-08 DIAGNOSIS — Z362 Encounter for other antenatal screening follow-up: Secondary | ICD-10-CM | POA: Diagnosis not present

## 2019-12-08 DIAGNOSIS — O24410 Gestational diabetes mellitus in pregnancy, diet controlled: Secondary | ICD-10-CM

## 2019-12-08 DIAGNOSIS — Z3480 Encounter for supervision of other normal pregnancy, unspecified trimester: Secondary | ICD-10-CM

## 2019-12-08 DIAGNOSIS — O99212 Obesity complicating pregnancy, second trimester: Secondary | ICD-10-CM

## 2019-12-08 DIAGNOSIS — Z8659 Personal history of other mental and behavioral disorders: Secondary | ICD-10-CM

## 2019-12-22 ENCOUNTER — Ambulatory Visit: Payer: Medicaid Other | Admitting: Obstetrics & Gynecology

## 2019-12-26 ENCOUNTER — Other Ambulatory Visit: Payer: Self-pay

## 2019-12-26 ENCOUNTER — Ambulatory Visit (INDEPENDENT_AMBULATORY_CARE_PROVIDER_SITE_OTHER): Payer: Medicaid Other | Admitting: Certified Nurse Midwife

## 2019-12-26 VITALS — BP 107/67 | HR 103 | Wt 250.0 lb

## 2019-12-26 DIAGNOSIS — Z3A30 30 weeks gestation of pregnancy: Secondary | ICD-10-CM

## 2019-12-26 DIAGNOSIS — O98513 Other viral diseases complicating pregnancy, third trimester: Secondary | ICD-10-CM

## 2019-12-26 DIAGNOSIS — Z3480 Encounter for supervision of other normal pregnancy, unspecified trimester: Secondary | ICD-10-CM

## 2019-12-26 DIAGNOSIS — O24419 Gestational diabetes mellitus in pregnancy, unspecified control: Secondary | ICD-10-CM

## 2019-12-26 DIAGNOSIS — U071 COVID-19: Secondary | ICD-10-CM

## 2019-12-26 DIAGNOSIS — O9A113 Malignant neoplasm complicating pregnancy, third trimester: Secondary | ICD-10-CM

## 2019-12-26 DIAGNOSIS — O99343 Other mental disorders complicating pregnancy, third trimester: Secondary | ICD-10-CM

## 2019-12-26 NOTE — Progress Notes (Signed)
Subjective:  [name] is a 34 y.o. G3P2002 at [redacted]w[redacted]d being seen today for ongoing prenatal care.  She is currently monitored for the following issues for this high-risk pregnancy and has Migraines; Depression; Carcinoid tumor of ovary; Mature teratoma; Obesity, Class III, BMI 40-49.9 (morbid obesity) ([place]); Anxiety; Female infertility; Right ovarian cyst; Family history of breast cancer; Family history of prostate cancer; History of right salpingo-oophorectomy; COVID-19 affecting pregnancy in first trimester; Supervision of other normal pregnancy, antepartum; Obesity affecting pregnancy; History of postpartum depression; and GDM, class A2 on their problem list.  Patient reports no complaints.  Contractions: Not present. Vag. Bleeding: None.  Movement: Present. Denies leaking of fluid.   The following portions of the patient's history were reviewed and updated as appropriate: allergies, current medications, past family history, past medical history, past social history, past surgical history and problem list. Problem list updated.  Objective:   Vitals:   12/26/19 0951  BP: 107/67  Pulse: (!) 103  Weight: 250 lb (113.4 kg)    Fetal Status: Fetal Heart Rate (bpm): 157 Fundal Height: 34 cm Movement: Present  Presentation: Vertex  General:  Alert, oriented and cooperative. Patient is in no acute distress.  Skin: Skin is warm and dry. No rash noted.   Cardiovascular: Normal heart rate noted  Respiratory: Normal respiratory effort, no problems with respiration noted  Abdomen: Soft, gravid, appropriate for gestational age. Pain/Pressure: Absent     Pelvic: Vag. Bleeding: None Vag D/C Character: Thin   Cervical exam deferred        Extremities: Normal range of motion.  Edema: None  Mental Status: Normal mood and affect. Normal behavior. Normal judgment and thought content.   Urinalysis:      Assessment and Plan:  Pregnancy: G3P2002 at [redacted]w[redacted]d  1. Supervision of other normal pregnancy,  antepartum  2. GDM, class A2/B - FBS 88-104, pp 100-120 - started Metformin 500 mg at hs about 3 weeks ago, tolerating w/o problem - recommend increase Metformin to 1000 mg at hs since 1/3 of FBS out of range, pt agrees to this plan  - start weekly BPP at 32w, scheduled - has fears of being induced and knows this will be recommended, will discuss more closer to time of delivery  Preterm labor symptoms and general obstetric precautions including but not limited to vaginal bleeding, contractions, leaking of fluid and fetal movement were reviewed in detail with the patient. Please refer to After Visit Summary for other counseling recommendations.  Return in about 2 weeks (around 01/09/2020).- virtual   Julianne Handler, CNM

## 2020-01-06 ENCOUNTER — Encounter: Payer: Self-pay | Admitting: *Deleted

## 2020-01-06 ENCOUNTER — Other Ambulatory Visit: Payer: Self-pay | Admitting: *Deleted

## 2020-01-06 ENCOUNTER — Ambulatory Visit: Payer: Medicaid Other | Attending: Obstetrics and Gynecology

## 2020-01-06 ENCOUNTER — Ambulatory Visit: Payer: Medicaid Other | Admitting: *Deleted

## 2020-01-06 ENCOUNTER — Other Ambulatory Visit: Payer: Self-pay

## 2020-01-06 DIAGNOSIS — O99213 Obesity complicating pregnancy, third trimester: Secondary | ICD-10-CM

## 2020-01-06 DIAGNOSIS — Z362 Encounter for other antenatal screening follow-up: Secondary | ICD-10-CM | POA: Diagnosis not present

## 2020-01-06 DIAGNOSIS — Z3A31 31 weeks gestation of pregnancy: Secondary | ICD-10-CM

## 2020-01-06 DIAGNOSIS — Z3480 Encounter for supervision of other normal pregnancy, unspecified trimester: Secondary | ICD-10-CM | POA: Diagnosis present

## 2020-01-06 DIAGNOSIS — O24415 Gestational diabetes mellitus in pregnancy, controlled by oral hypoglycemic drugs: Secondary | ICD-10-CM | POA: Diagnosis not present

## 2020-01-06 DIAGNOSIS — Z8759 Personal history of other complications of pregnancy, childbirth and the puerperium: Secondary | ICD-10-CM | POA: Diagnosis present

## 2020-01-06 DIAGNOSIS — Z8659 Personal history of other mental and behavioral disorders: Secondary | ICD-10-CM

## 2020-01-06 DIAGNOSIS — O24410 Gestational diabetes mellitus in pregnancy, diet controlled: Secondary | ICD-10-CM | POA: Diagnosis not present

## 2020-01-06 DIAGNOSIS — O24419 Gestational diabetes mellitus in pregnancy, unspecified control: Secondary | ICD-10-CM

## 2020-01-09 ENCOUNTER — Other Ambulatory Visit: Payer: Self-pay | Admitting: *Deleted

## 2020-01-12 ENCOUNTER — Other Ambulatory Visit: Payer: Self-pay

## 2020-01-12 ENCOUNTER — Ambulatory Visit: Payer: Medicaid Other | Attending: Obstetrics and Gynecology

## 2020-01-12 ENCOUNTER — Ambulatory Visit: Payer: Medicaid Other | Admitting: *Deleted

## 2020-01-12 DIAGNOSIS — Z3480 Encounter for supervision of other normal pregnancy, unspecified trimester: Secondary | ICD-10-CM | POA: Insufficient documentation

## 2020-01-12 DIAGNOSIS — Z3A32 32 weeks gestation of pregnancy: Secondary | ICD-10-CM

## 2020-01-12 DIAGNOSIS — Z8659 Personal history of other mental and behavioral disorders: Secondary | ICD-10-CM | POA: Insufficient documentation

## 2020-01-12 DIAGNOSIS — O24419 Gestational diabetes mellitus in pregnancy, unspecified control: Secondary | ICD-10-CM

## 2020-01-12 DIAGNOSIS — O99213 Obesity complicating pregnancy, third trimester: Secondary | ICD-10-CM

## 2020-01-12 DIAGNOSIS — Z8759 Personal history of other complications of pregnancy, childbirth and the puerperium: Secondary | ICD-10-CM | POA: Diagnosis present

## 2020-01-12 DIAGNOSIS — O24410 Gestational diabetes mellitus in pregnancy, diet controlled: Secondary | ICD-10-CM | POA: Insufficient documentation

## 2020-01-12 DIAGNOSIS — O24415 Gestational diabetes mellitus in pregnancy, controlled by oral hypoglycemic drugs: Secondary | ICD-10-CM

## 2020-01-16 ENCOUNTER — Telehealth (INDEPENDENT_AMBULATORY_CARE_PROVIDER_SITE_OTHER): Payer: Medicaid Other | Admitting: Certified Nurse Midwife

## 2020-01-16 DIAGNOSIS — O99210 Obesity complicating pregnancy, unspecified trimester: Secondary | ICD-10-CM

## 2020-01-16 DIAGNOSIS — Z3A33 33 weeks gestation of pregnancy: Secondary | ICD-10-CM

## 2020-01-16 DIAGNOSIS — Z3480 Encounter for supervision of other normal pregnancy, unspecified trimester: Secondary | ICD-10-CM

## 2020-01-16 DIAGNOSIS — Z8659 Personal history of other mental and behavioral disorders: Secondary | ICD-10-CM

## 2020-01-16 DIAGNOSIS — O99213 Obesity complicating pregnancy, third trimester: Secondary | ICD-10-CM

## 2020-01-16 DIAGNOSIS — O24419 Gestational diabetes mellitus in pregnancy, unspecified control: Secondary | ICD-10-CM

## 2020-01-16 DIAGNOSIS — O24415 Gestational diabetes mellitus in pregnancy, controlled by oral hypoglycemic drugs: Secondary | ICD-10-CM

## 2020-01-16 NOTE — Progress Notes (Signed)
OBSTETRICS PRENATAL VIRTUAL VISIT ENCOUNTER NOTE  Provider location: [facility] [place]   I connected with Clayborn Heron on 01/16/20 at 10:00 AM EDT by MyChart Video Encounter at home and verified that I am speaking with the correct person using two identifiers.   I discussed the limitations, risks, security and privacy concerns of performing an evaluation and management service virtually and the availability of in person appointments. I also discussed with the patient that there may be a patient responsible charge related to this service. The patient expressed understanding and agreed to proceed. Subjective:  [name] is a 34 y.o. G3P2002 at [redacted]w[redacted]d being seen today for ongoing prenatal care.  She is currently monitored for the following issues for this high-risk pregnancy and has Migraines; Depression; Carcinoid tumor of ovary; Mature teratoma; Obesity, Class III, BMI 40-49.9 (morbid obesity) ([place]); Anxiety; Female infertility; Right ovarian cyst; Family history of breast cancer; Family history of prostate cancer; History of right salpingo-oophorectomy; COVID-19 affecting pregnancy in first trimester; Supervision of other normal pregnancy, antepartum; Obesity affecting pregnancy; History of postpartum depression; and GDM, class A2 on their problem list.  Patient reports feels like BS is dropping after breakfast before time for 2hr check, has tried to eat a protein snack which helps.  Contractions: Not present. Vag. Bleeding: None.  Movement: Present. Denies any leaking of fluid.   The following portions of the patient's history were reviewed and updated as appropriate: allergies, current medications, past family history, past medical history, past social history, past surgical history and problem list.   Objective:   Vitals:   01/16/20 1000  BP: 124/73  Pulse: 99  Weight: 248 lb (112.5 kg)    Fetal Status:     Movement: Present     General:  Alert,  oriented and cooperative. Patient is in no acute distress.  Respiratory: Normal respiratory effort, no problems with respiration noted  Mental Status: Normal mood and affect. Normal behavior. Normal judgment and thought content.  Rest of physical exam deferred due to type of encounter  Imaging: [place] MFM FETAL BPP WO NON STRESS  Result Date: 01/12/2020 ----------------------------------------------------------------------  OBSTETRICS REPORT                       (Signed Final 01/12/2020 02:19 pm) ---------------------------------------------------------------------- Patient Info  ID #:       [id number]                          D.O.B.:  Jun 22, 1986 (34 yrs)  Name:       Clayborn Heron                Visit Date: 01/12/2020 02:12 pm ---------------------------------------------------------------------- Performed By  Attending:        Johnell Comings MD         Ref. Address:     [place], [place]  Performed By:     [facility]                                     Fetal Care  Referred By:      Willene Hatchet ---------------------------------------------------------------------- Orders  #  Description                           Code        Ordered By  1  [place] MFM FETAL BPP WO NON               61443.15    YU FANG     STRESS ----------------------------------------------------------------------  #  Order #                     Accession #                Episode #  1  [id number]                   [id number]                 [id number] ---------------------------------------------------------------------- Indications  Gestational diabetes in pregnancy,             O24.415  controlled by oral hypoglycemic drugs  [redacted] weeks gestation of pregnancy                Z3A.10  Maternal morbid obesity                        O99.210 E66.01  ---------------------------------------------------------------------- Fetal Evaluation  Num Of Fetuses:         1  Fetal Heart Rate(bpm):  157  Cardiac Activity:       Observed  Presentation:           Cephalic  Placenta:               Anterior  Amniotic Fluid  AFI FV:      Within normal limits  AFI Sum(cm)     %Tile       Largest Pocket(cm)  10.6            21          3.7  RUQ(cm)       RLQ(cm)       LUQ(cm)        LLQ(cm)  2.3           2.2           3.7            2.4 ---------------------------------------------------------------------- Biophysical Evaluation  Amniotic F.V:   Within normal limits       F. Tone:        Observed  F. Movement:    Observed                   Score:          8/8  F. Breathing:   Observed ---------------------------------------------------------------------- OB History  Blood Type:   A+  Gravidity:    3         Term:   2        Prem:   0        SAB:   0  TOP:          0  Ectopic:  0        Living: 2 ---------------------------------------------------------------------- Gestational Age  LMP:           33w 2d        Date:  05/24/19                 EDD:   02/28/20  Best:          Milderd Meager 3d     Det. By:  Loman Chroman         EDD:   03/05/20                                      (07/15/19) ---------------------------------------------------------------------- Anatomy  Stomach:               Appears normal, left   Bladder:                Appears normal                         sided ---------------------------------------------------------------------- Comments  This patient was seen for a biophysical profile due to  gestational diabetes currently treated with Metformin.  A biophysical profile performed today was 8 out of 8.  There was normal amniotic fluid noted on today's ultrasound  exam.  She will return in 1 week for another biophysical profile. ----------------------------------------------------------------------                   Johnell Comings, MD Electronically Signed Final  Report   01/12/2020 02:19 pm ----------------------------------------------------------------------  [place] MFM OB FOLLOW UP  Result Date: 01/06/2020 ----------------------------------------------------------------------  OBSTETRICS REPORT                       (Signed Final 01/06/2020 02:36 pm) ---------------------------------------------------------------------- Patient Info  ID #:       [id number]                          D.O.B.:  14-Feb-1986 (34 yrs)  Name:       Clayborn Heron                Visit Date: 01/06/2020 01:20 pm ---------------------------------------------------------------------- Performed By  Attending:        Johnell Comings MD         Ref. Address:     [address] [place], [place]  Performed By:     Germain Osgood            [facility]  Referred By:      [facility] ---------------------------------------------------------------------- Orders  #  Description                           Code        Ordered By  1  [place] MFM OB FOLLOW UP                   [phone]    Peterson Ao ----------------------------------------------------------------------  #  Order #                     Accession #                Episode #  1  [id number]                   [id number]                 [id number] ---------------------------------------------------------------------- Indications  Gestational diabetes in pregnancy,             O24.415  controlled by oral hypoglycemic drugs  Maternal morbid obesity                        O99.210 E66.01  [redacted] weeks gestation of pregnancy                Z3A.31  Encounter for other antenatal screening        Z36.2  follow-up ---------------------------------------------------------------------- Fetal Evaluation  Num Of Fetuses:         1  Fetal Heart Rate(bpm):  141  Cardiac Activity:       Observed  Presentation:           Cephalic   Placenta:               Anterior  P. Cord Insertion:      Previously Visualized  Amniotic Fluid  AFI FV:      Within normal limits  AFI Sum(cm)     %Tile       Largest Pocket(cm)  12.3            33          4.5  RUQ(cm)       RLQ(cm)       LUQ(cm)        LLQ(cm)  4.5           2.8           2.9            2.1 ---------------------------------------------------------------------- Biometry  BPD:      82.6  mm     G. Age:  33w 2d         85  %    CI:        77.09   %    70 - 86                                                          FL/HC:      20.2   %    19.1 - 21.3  HC:      297.9  mm     G. Age:  33w 0d         51  %    HC/AC:  0.98        0.96 - 1.17  AC:      302.5  mm     G. Age:  34w 1d         98  %    FL/BPD:     72.8   %    71 - 87  FL:       60.1  mm     G. Age:  31w 2d         28  %    FL/AC:      19.9   %    20 - 24  CER:      42.8  mm     G. Age:  36w 6d       > 95  %  LV:        4.8  mm  Est. FW:    2134  gm    4 lb 11 oz      87  % ---------------------------------------------------------------------- OB History  Blood Type:   A+  Gravidity:    3         Term:   2        Prem:   0        SAB:   0  TOP:          0       Ectopic:  0        Living: 2 ---------------------------------------------------------------------- Gestational Age  LMP:           32w 3d        Date:  05/24/19                 EDD:   02/28/20  U/S Today:     33w 0d                                        EDD:   02/24/20  Best:          31w 4d     Det. By:  Loman Chroman         EDD:   03/05/20                                      (07/15/19) ---------------------------------------------------------------------- Anatomy  Cranium:               Appears normal         LVOT:                   Previously seen  Cavum:                 Appears normal         Aortic Arch:            Previously seen  Ventricles:            Appears normal         Ductal Arch:            Previously seen  Choroid Plexus:        Previously seen         Diaphragm:              Appears normal  Cerebellum:  Appears normal         Stomach:                Appears normal, left                                                                        sided  Posterior Fossa:       Previously seen        Abdomen:                Previously seen  Nuchal Fold:           Previously seen        Abdominal Wall:         Previously seen  Face:                  Orbits and profile     Cord Vessels:           Previously seen                         previously seen  Lips:                  Appears normal         Kidneys:                Appear normal  Palate:                Not well visualized    Bladder:                Appears normal  Thoracic:              Previously seen        Spine:                  Ltd views no                                                                        intracranial signs of                                                                        NTD  Heart:                 Previously seen        Upper Extremities:      Prev Visualized  RVOT:                  Previously seen        Lower Extremities:      Previously seen  Other:  Nasal bone prev visualized. Heels prev visualized. Technically  difficult          due to maternal habitus and fetal position. ---------------------------------------------------------------------- Cervix Uterus Adnexa  Cervix  Not visualized (advanced GA >24wks)  Uterus  No abnormality visualized.  Right Ovary  No adnexal mass visualized.  Left Ovary  No adnexal mass visualized.  Cul De Sac  No free fluid seen.  Adnexa  No abnormality visualized. ---------------------------------------------------------------------- Comments  This patient was seen for a follow up growth scan due to  gestational diabetes that is currently treated with Metformin.  She denies any problems since her last exam.  She was informed that the fetal growth and amniotic fluid  level appears appropriate for her gestational age.  Due to gestational  diabetes treated with Metformin, we will  start weekly fetal testing at 32 weeks.  A biophysical profile was scheduled in 1 week. ----------------------------------------------------------------------                   Johnell Comings, MD Electronically Signed Final Report   01/06/2020 02:36 pm ----------------------------------------------------------------------   Assessment and Plan:  Pregnancy: G8T1572 at [redacted]w[redacted]d 1. GDM, class A2 - log reviewed, FBS 87-95, postprandials 93-115 - Continue Metformin 500 mg at hs - Continue weekly AT at MFM - recommend increasing protein with breakfast meal  2. Obesity affecting pregnancy, antepartum - weight stable  3. Supervision of other normal pregnancy, antepartum   Preterm labor symptoms and general obstetric precautions including but not limited to vaginal bleeding, contractions, leaking of fluid and fetal movement were reviewed in detail with the patient. I discussed the assessment and treatment plan with the patient. The patient was provided an opportunity to ask questions and all were answered. The patient agreed with the plan and demonstrated an understanding of the instructions. The patient was advised to call back or seek an in-person office evaluation/go to MAU at [facility] for any urgent or concerning symptoms. Please refer to After Visit Summary for other counseling recommendations.   I provided 15 minutes of face-to-face time during this encounter.  Return in about 2 weeks (around 01/30/2020).  Future Appointments  Date Time Provider [place]  01/20/2020  3:45 PM [facility] NURSE [facility]  01/20/2020  3:45 PM [facility] US4 [facility]  01/26/2020  8:45 AM [facility] NURSE [facility]  01/26/2020  8:45 AM [facility] US5 [facility]  02/02/2020  9:45 AM [facility] NURSE [facility]  02/02/2020  9:45 AM [facility] US4 [facility]  02/03/2020  3:00 PM Left[place]-Kirby, Kathie Dike, CNM [facility]  02/10/2020  9:35 AM  Leftwich-Kirby, Kathie Dike, CNM [facility]    Julianne Handler, [place] for [employer], [employer]

## 2020-01-19 ENCOUNTER — Telehealth: Payer: Self-pay | Admitting: *Deleted

## 2020-01-19 ENCOUNTER — Ambulatory Visit: Payer: Medicaid Other

## 2020-01-19 MED ORDER — METFORMIN HCL 500 MG PO TABS: 500.0000 mg | ORAL_TABLET | Freq: Two times a day (BID) | ORAL | 6 refills | Status: DC

## 2020-01-19 NOTE — Telephone Encounter (Signed)
New RX sent for Metformin 500 mg.  The original RX was for daily and the RX was changed to BID.  Pharmacy said too early to RF so a new RX was sent.

## 2020-01-20 ENCOUNTER — Other Ambulatory Visit: Payer: Self-pay

## 2020-01-20 ENCOUNTER — Ambulatory Visit: Payer: Medicaid Other | Attending: Obstetrics and Gynecology

## 2020-01-20 ENCOUNTER — Ambulatory Visit: Payer: Medicaid Other | Admitting: *Deleted

## 2020-01-20 ENCOUNTER — Encounter: Payer: Self-pay | Admitting: *Deleted

## 2020-01-20 DIAGNOSIS — Z8659 Personal history of other mental and behavioral disorders: Secondary | ICD-10-CM | POA: Diagnosis present

## 2020-01-20 DIAGNOSIS — Z3480 Encounter for supervision of other normal pregnancy, unspecified trimester: Secondary | ICD-10-CM | POA: Diagnosis present

## 2020-01-20 DIAGNOSIS — Z8759 Personal history of other complications of pregnancy, childbirth and the puerperium: Secondary | ICD-10-CM | POA: Insufficient documentation

## 2020-01-20 DIAGNOSIS — Z3A32 32 weeks gestation of pregnancy: Secondary | ICD-10-CM | POA: Diagnosis not present

## 2020-01-20 DIAGNOSIS — O24415 Gestational diabetes mellitus in pregnancy, controlled by oral hypoglycemic drugs: Secondary | ICD-10-CM | POA: Diagnosis not present

## 2020-01-20 DIAGNOSIS — O24419 Gestational diabetes mellitus in pregnancy, unspecified control: Secondary | ICD-10-CM | POA: Diagnosis present

## 2020-01-20 DIAGNOSIS — O99213 Obesity complicating pregnancy, third trimester: Secondary | ICD-10-CM | POA: Diagnosis not present

## 2020-01-26 ENCOUNTER — Ambulatory Visit: Payer: Medicaid Other | Attending: Obstetrics and Gynecology

## 2020-01-26 ENCOUNTER — Ambulatory Visit: Payer: Medicaid Other

## 2020-01-26 ENCOUNTER — Other Ambulatory Visit: Payer: Self-pay

## 2020-01-26 ENCOUNTER — Ambulatory Visit: Payer: Medicaid Other | Admitting: *Deleted

## 2020-01-26 DIAGNOSIS — O24415 Gestational diabetes mellitus in pregnancy, controlled by oral hypoglycemic drugs: Secondary | ICD-10-CM | POA: Diagnosis not present

## 2020-01-26 DIAGNOSIS — Z8659 Personal history of other mental and behavioral disorders: Secondary | ICD-10-CM | POA: Insufficient documentation

## 2020-01-26 DIAGNOSIS — Z8759 Personal history of other complications of pregnancy, childbirth and the puerperium: Secondary | ICD-10-CM

## 2020-01-26 DIAGNOSIS — O24419 Gestational diabetes mellitus in pregnancy, unspecified control: Secondary | ICD-10-CM

## 2020-01-26 DIAGNOSIS — O24410 Gestational diabetes mellitus in pregnancy, diet controlled: Secondary | ICD-10-CM | POA: Insufficient documentation

## 2020-01-26 DIAGNOSIS — O99213 Obesity complicating pregnancy, third trimester: Secondary | ICD-10-CM

## 2020-01-26 DIAGNOSIS — Z3A34 34 weeks gestation of pregnancy: Secondary | ICD-10-CM

## 2020-01-26 DIAGNOSIS — Z362 Encounter for other antenatal screening follow-up: Secondary | ICD-10-CM

## 2020-01-26 DIAGNOSIS — Z3480 Encounter for supervision of other normal pregnancy, unspecified trimester: Secondary | ICD-10-CM | POA: Insufficient documentation

## 2020-02-02 ENCOUNTER — Other Ambulatory Visit: Payer: Self-pay

## 2020-02-02 ENCOUNTER — Ambulatory Visit: Payer: Medicaid Other

## 2020-02-02 ENCOUNTER — Ambulatory Visit: Payer: Medicaid Other | Attending: Obstetrics

## 2020-02-02 ENCOUNTER — Encounter: Payer: Self-pay | Admitting: *Deleted

## 2020-02-02 ENCOUNTER — Ambulatory Visit: Payer: Medicaid Other | Admitting: *Deleted

## 2020-02-02 DIAGNOSIS — O24419 Gestational diabetes mellitus in pregnancy, unspecified control: Secondary | ICD-10-CM | POA: Diagnosis present

## 2020-02-02 DIAGNOSIS — Z8759 Personal history of other complications of pregnancy, childbirth and the puerperium: Secondary | ICD-10-CM | POA: Insufficient documentation

## 2020-02-02 DIAGNOSIS — Z3480 Encounter for supervision of other normal pregnancy, unspecified trimester: Secondary | ICD-10-CM | POA: Insufficient documentation

## 2020-02-02 DIAGNOSIS — Z362 Encounter for other antenatal screening follow-up: Secondary | ICD-10-CM

## 2020-02-02 DIAGNOSIS — O24415 Gestational diabetes mellitus in pregnancy, controlled by oral hypoglycemic drugs: Secondary | ICD-10-CM | POA: Diagnosis present

## 2020-02-02 DIAGNOSIS — O99213 Obesity complicating pregnancy, third trimester: Secondary | ICD-10-CM

## 2020-02-02 DIAGNOSIS — O24410 Gestational diabetes mellitus in pregnancy, diet controlled: Secondary | ICD-10-CM | POA: Diagnosis present

## 2020-02-02 DIAGNOSIS — Z8659 Personal history of other mental and behavioral disorders: Secondary | ICD-10-CM | POA: Diagnosis present

## 2020-02-02 DIAGNOSIS — Z3A35 35 weeks gestation of pregnancy: Secondary | ICD-10-CM | POA: Diagnosis not present

## 2020-02-02 DIAGNOSIS — Z7984 Long term (current) use of oral hypoglycemic drugs: Secondary | ICD-10-CM

## 2020-02-03 ENCOUNTER — Telehealth (INDEPENDENT_AMBULATORY_CARE_PROVIDER_SITE_OTHER): Payer: Medicaid Other | Admitting: Student

## 2020-02-03 DIAGNOSIS — E669 Obesity, unspecified: Secondary | ICD-10-CM

## 2020-02-03 DIAGNOSIS — O24419 Gestational diabetes mellitus in pregnancy, unspecified control: Secondary | ICD-10-CM

## 2020-02-03 DIAGNOSIS — O99213 Obesity complicating pregnancy, third trimester: Secondary | ICD-10-CM | POA: Diagnosis not present

## 2020-02-03 DIAGNOSIS — Z8659 Personal history of other mental and behavioral disorders: Secondary | ICD-10-CM

## 2020-02-03 DIAGNOSIS — Z3480 Encounter for supervision of other normal pregnancy, unspecified trimester: Secondary | ICD-10-CM

## 2020-02-03 DIAGNOSIS — O09293 Supervision of pregnancy with other poor reproductive or obstetric history, third trimester: Secondary | ICD-10-CM | POA: Diagnosis not present

## 2020-02-03 DIAGNOSIS — Z3A35 35 weeks gestation of pregnancy: Secondary | ICD-10-CM

## 2020-02-03 NOTE — Progress Notes (Signed)
   TELEHEALTH OBSTETRICS VISIT ENCOUNTER NOTE  I connected with [name] on 02/03/20 at  3:00 PM EDT by telephone at home and verified that I am speaking with the correct person using two identifiers.   I discussed the limitations, risks, security and privacy concerns of performing an evaluation and management service by telephone and the availability of in person appointments. I also discussed with the patient that there may be a patient responsible charge related to this service. The patient expressed understanding and agreed to proceed.  Subjective:  [name] is a 34 y.o. G3P2002 at [redacted]w[redacted]d being followed for ongoing prenatal care.  She is currently monitored for the following issues for this high-risk pregnancy and has Migraines; Depression; Carcinoid tumor of ovary; Mature teratoma; Obesity, Class III, BMI 40-49.9 (morbid obesity) ([place]); Anxiety; Female infertility; Right ovarian cyst; Family history of breast cancer; Family history of prostate cancer; History of right salpingo-oophorectomy; COVID-19 affecting pregnancy in first trimester; Supervision of other normal pregnancy, antepartum; Obesity affecting pregnancy; History of postpartum depression; and GDM, class A2 on their problem list.  Patient reports feeling occasional headache and nausea. Not sure if its due to heat or her diabetes.  Hard to tell if pregnancy related or not. Taking 1000 mg of metformin at night to help with her fastings. . Reports fetal movement. Denies any contractions, bleeding or leaking of fluid.   The following portions of the patient's history were reviewed and updated as appropriate: allergies, current medications, past family history, past medical history, past social history, past surgical history and problem list.   Objective:   General:  Alert, oriented and cooperative.   Mental Status: Normal mood and affect perceived. Normal judgment and thought content.  Rest of physical exam deferred due  to type of encounter  Assessment and Plan:  Pregnancy: G3P2002 at [redacted]w[redacted]d 1. GDM, class A2 87-95 fasting After meal numbers have been in the 120s, all others are below that.  Feels like her blood sugars are well controlled.  Continue weekly BPPPs  2. Obesity affecting pregnancy in third trimester   3. History of postpartum depression   4. Supervision of other normal pregnancy, antepartum  Preterm labor symptoms and general obstetric precautions including but not limited to vaginal bleeding, contractions, leaking of fluid and fetal movement were reviewed in detail with the patient.  I discussed the assessment and treatment plan with the patient. The patient was provided an opportunity to ask questions and all were answered. The patient agreed with the plan and demonstrated an understanding of the instructions. The patient was advised to call back or seek an in-person office evaluation/go to [facility]U at [facility] for any urgent or concerning symptoms. Please refer to After Visit Summary for other counseling recommendations.   I provided 20 minutes of non-face-to-face time during this encounter.  No follow-ups on file.  Future Appointments  Date Time Provider [place]  02/10/2020  9:35 AM Simona Huh [facility]  02/10/2020  1:45 PM [facility] US6 [facility]  02/16/2020  3:45 PM [facility] US1 [facility]  02/23/2020  3:45 PM [facility] US1 [facility] [place], [place] for [employer], [place]

## 2020-02-04 ENCOUNTER — Other Ambulatory Visit: Payer: Self-pay | Admitting: *Deleted

## 2020-02-04 DIAGNOSIS — O24415 Gestational diabetes mellitus in pregnancy, controlled by oral hypoglycemic drugs: Secondary | ICD-10-CM

## 2020-02-10 ENCOUNTER — Other Ambulatory Visit: Payer: Self-pay

## 2020-02-10 ENCOUNTER — Ambulatory Visit: Payer: Medicaid Other | Admitting: *Deleted

## 2020-02-10 ENCOUNTER — Ambulatory Visit (INDEPENDENT_AMBULATORY_CARE_PROVIDER_SITE_OTHER): Payer: Medicaid Other | Admitting: Advanced Practice Midwife

## 2020-02-10 ENCOUNTER — Ambulatory Visit: Payer: Medicaid Other | Attending: Certified Nurse Midwife

## 2020-02-10 ENCOUNTER — Other Ambulatory Visit (HOSPITAL_COMMUNITY)
Admission: RE | Admit: 2020-02-10 | Discharge: 2020-02-10 | Disposition: A | Discharge status: Home / Self Care | Payer: Medicaid Other | Source: Ambulatory Visit | Attending: Advanced Practice Midwife | Admitting: Advanced Practice Midwife

## 2020-02-10 ENCOUNTER — Encounter: Payer: Self-pay | Admitting: *Deleted

## 2020-02-10 VITALS — BP 108/62 | HR 88 | Wt 249.0 lb

## 2020-02-10 DIAGNOSIS — O24415 Gestational diabetes mellitus in pregnancy, controlled by oral hypoglycemic drugs: Secondary | ICD-10-CM

## 2020-02-10 DIAGNOSIS — Z8659 Personal history of other mental and behavioral disorders: Secondary | ICD-10-CM | POA: Diagnosis present

## 2020-02-10 DIAGNOSIS — O99213 Obesity complicating pregnancy, third trimester: Secondary | ICD-10-CM

## 2020-02-10 DIAGNOSIS — O24419 Gestational diabetes mellitus in pregnancy, unspecified control: Secondary | ICD-10-CM

## 2020-02-10 DIAGNOSIS — Z3480 Encounter for supervision of other normal pregnancy, unspecified trimester: Secondary | ICD-10-CM

## 2020-02-10 DIAGNOSIS — Z8759 Personal history of other complications of pregnancy, childbirth and the puerperium: Secondary | ICD-10-CM

## 2020-02-10 DIAGNOSIS — Z3A36 36 weeks gestation of pregnancy: Secondary | ICD-10-CM

## 2020-02-10 DIAGNOSIS — Z362 Encounter for other antenatal screening follow-up: Secondary | ICD-10-CM | POA: Diagnosis not present

## 2020-02-10 LAB — OB RESULTS CONSOLE GBS: GBS: NEGATIVE

## 2020-02-10 NOTE — Patient Instructions (Signed)
Things to Try After 37 weeks to Encourage Labor/Get Ready for Labor:   1.  Try the [employer] at [email or web address] daily to improve baby's position and encourage the onset of labor.  2. Walk a little and rest a little every day.  Change positions often.  3. Cervical Ripening: May try one or both a. Red Raspberry Leaf capsules or tea:  two 300mg  or 400mg  tablets with each meal, 2-3 times a day, or 1-3 cups of tea daily  Potential Side Effects Of Raspberry Leaf:  Most women do not experience any side effects from drinking raspberry leaf tea. However, nausea and loose stools are possible   b. Evening Primrose Oil capsules: may take 1 to 3 capsules daily. May also prick one to release the oil and insert it into your vagina at night.  Some of the potential side effects:  Upset stomach  Loose stools or diarrhea  Headaches  Nausea  4. Sex (and especially sex with orgasm) can also help the cervix ripen and encourage labor onset.   Considering Waterbirth? Guide for patients at [facility] for [employer] Why consider waterbirth? . Gentle birth for babies  . Less pain medicine used in labor  . May allow for passive descent/less pushing  . May reduce perineal tears  . More mobility and instinctive maternal position changes  . Increased maternal relaxation  . Reduced blood pressure in labor   Is waterbirth safe? What are the risks of infection, drowning or other complications? . Infection:  Marland Kitchen Very low risk (3.7 % for tub vs 4.8% for bed)  . 7 in 49 waterbirths with documented infection  . Poorly cleaned equipment most common cause  . Slightly lower group B strep transmission rate  . Drowning  . Maternal:  . Very low risk  . Related to seizures or fainting  . Newborn:  Marland Kitchen Very low risk. No evidence of increased risk of respiratory problems in multiple large studies  . Physiological protection from breathing under water  . Avoid underwater birth if there are any fetal  complications  . Once baby's head is out of the water, keep it out.  . Birth complication  . Some reports of cord trauma, but risk decreased by bringing baby to surface gradually  . No evidence of increased risk of shoulder dystocia. Mothers can usually change positions faster in water than in a bed, possibly aiding the maneuvers to free the shoulder.  ? You must attend a Waterbirth class at [facility] . 3rd Wednesday of every month from 7-9pm  . Free  . Register by calling [phone] or online at [email or web address]  . Bring [place] the certificate from the class to your prenatal appointment  Meet with a midwife at 36 weeks to see if you can still plan a waterbirth and to sign the consent.   If you plan a waterbirth at [facility] and [facility], you can opt to purchase the following: . Fish Net . Bathing suit top (optional)  . Long-handled mirror (optional)  .  Things that would prevent you from having a waterbirth: . Unknown or Positive COVID-19 diagnosis upon admission to hospital  . Premature, <37wks  . Previous cesarean birth  . Presence of thick meconium-stained fluid  . Multiple gestation (Twins, triplets, etc.)  . Uncontrolled diabetes or gestational diabetes requiring medication  . Hypertension requiring medication or diagnosis of pre-eclampsia  . Heavy vaginal bleeding  . Non-reassuring fetal heart rate  .  Active infection (MRSA, etc.). Group B Strep is NOT a contraindication for waterbirth.  . If your labor has to be induced and induction method requires continuous monitoring of the baby's heart rate  . Other risks/issues identified by your obstetrical provider  Please remember that birth is unpredictable. Under certain unforeseeable circumstances your provider may advise against giving birth in the tub. These decisions will be made on a case-by-case basis and with the safety of you and your baby as our highest  priority.  **Please remember that in order to have a waterbirth, you must test Negative to COVID-19 upon admission to the hospital.**

## 2020-02-10 NOTE — Progress Notes (Signed)
   PRENATAL VISIT NOTE  Subjective:  [name] is a 34 y.o. G3P2002 at [redacted]w[redacted]d being seen today for ongoing prenatal care.  She is currently monitored for the following issues for this low-risk pregnancy and has Migraines; Depression; Carcinoid tumor of ovary; Mature teratoma; Obesity, Class III, BMI 40-49.9 (morbid obesity) ([place]); Anxiety; Female infertility; Right ovarian cyst; Family history of breast cancer; Family history of prostate cancer; History of right salpingo-oophorectomy; COVID-19 affecting pregnancy in first trimester; Supervision of other normal pregnancy, antepartum; Obesity affecting pregnancy; History of postpartum depression; and GDM, class A2 on their problem list.  Patient reports no complaints.  Contractions: Not present. Vag. Bleeding: None.  Movement: Present. Denies leaking of fluid.   The following portions of the patient's history were reviewed and updated as appropriate: allergies, current medications, past family history, past medical history, past social history, past surgical history and problem list.   Objective:   Vitals:   02/10/20 0935  BP: 108/62  Pulse: 88  Weight: 249 lb (112.9 kg)    Fetal Status:     Movement: Present     General:  Alert, oriented and cooperative. Patient is in no acute distress.  Skin: Skin is warm and dry. No rash noted.   Cardiovascular: Normal heart rate noted  Respiratory: Normal respiratory effort, no problems with respiration noted  Abdomen: Soft, gravid, appropriate for gestational age.  Pain/Pressure: Absent     Pelvic: Cervical exam deferred        Extremities: Normal range of motion.  Edema: None  Mental Status: Normal mood and affect. Normal behavior. Normal judgment and thought content.   Assessment and Plan:  Pregnancy: G3P2002 at [redacted]w[redacted]d 1. Supervision of other normal pregnancy, antepartum --Anticipatory guidance about next visits/weeks of pregnancy given. --Pt expresses interest in waterbirth. Attended  class with previous pregnancy. --Discussed waterbirth and pt possible IOL for DM but DM is not contraindication as long as glucose remains well controlled.  Consent signed today and pt encouraged to enroll in new class since last birth was at [place] have changed. Pt aware that waterbirth may not be possible if continuous monitoring is required. - Culture, Grp B Strep w/Rflx Suscept - Cervicovaginal ancillary only( )  2. GDM, class A2 --Metformin 1000 mg Q HS --Fastings all wnl, PP all under 120 except 1, which was 122 --Glucose well controlled on current therapy --IOL at 39 weeks recommended but pt expressed concerns today, and may want to delay IOL to 40 weeks if antenatal testing is normal.  3. Obesity affecting pregnancy in third trimester   4. History of postpartum depression --Doing well, stable  Term labor symptoms and general obstetric precautions including but not limited to vaginal bleeding, contractions, leaking of fluid and fetal movement were reviewed in detail with the patient. Please refer to After Visit Summary for other counseling recommendations.   No follow-ups on file.  Future Appointments  Date Time Provider [place]  02/10/2020  1:30 PM [facility] NURSE [facility]  02/10/2020  1:45 PM [facility] US6 [facility]  02/16/2020  3:30 PM [facility] NURSE [facility]  02/16/2020  3:45 PM [facility] US1 [facility]  02/23/2020  3:30 PM [facility] NURSE [facility]  02/23/2020  3:45 PM [facility] US1 [facility] [place]    Fatima Blank, CNM

## 2020-02-11 LAB — CERVICOVAGINAL ANCILLARY ONLY
Chlamydia: NEGATIVE
Comment: NEGATIVE
Comment: NORMAL
Neisseria Gonorrhea: NEGATIVE

## 2020-02-13 LAB — CULTURE, STREPTOCOCCUS GRP B W/SUSCEPT
MICRO NUMBER:: 10672882
SPECIMEN QUALITY:: ADEQUATE

## 2020-02-16 ENCOUNTER — Encounter (INDEPENDENT_AMBULATORY_CARE_PROVIDER_SITE_OTHER): Payer: Self-pay

## 2020-02-16 ENCOUNTER — Other Ambulatory Visit: Payer: Medicaid Other

## 2020-02-16 ENCOUNTER — Ambulatory Visit: Payer: Medicaid Other

## 2020-02-17 ENCOUNTER — Ambulatory Visit (INDEPENDENT_AMBULATORY_CARE_PROVIDER_SITE_OTHER): Payer: Medicaid Other | Admitting: Advanced Practice Midwife

## 2020-02-17 ENCOUNTER — Other Ambulatory Visit: Payer: Self-pay

## 2020-02-17 VITALS — BP 113/72 | HR 88 | Wt 251.0 lb

## 2020-02-17 DIAGNOSIS — O24415 Gestational diabetes mellitus in pregnancy, controlled by oral hypoglycemic drugs: Secondary | ICD-10-CM | POA: Diagnosis not present

## 2020-02-17 DIAGNOSIS — O99891 Other specified diseases and conditions complicating pregnancy: Secondary | ICD-10-CM | POA: Diagnosis not present

## 2020-02-17 DIAGNOSIS — R5383 Other fatigue: Secondary | ICD-10-CM

## 2020-02-17 DIAGNOSIS — O24419 Gestational diabetes mellitus in pregnancy, unspecified control: Secondary | ICD-10-CM

## 2020-02-17 DIAGNOSIS — Z3480 Encounter for supervision of other normal pregnancy, unspecified trimester: Secondary | ICD-10-CM

## 2020-02-17 DIAGNOSIS — Z8659 Personal history of other mental and behavioral disorders: Secondary | ICD-10-CM | POA: Diagnosis not present

## 2020-02-17 DIAGNOSIS — Z3A37 37 weeks gestation of pregnancy: Secondary | ICD-10-CM

## 2020-02-17 NOTE — Progress Notes (Signed)
° °  PRENATAL VISIT NOTE  Subjective:  [name] is a 34 y.o. G3P2002 at [redacted]w[redacted]d being seen today for ongoing prenatal care.  She is currently monitored for the following issues for this high-risk pregnancy and has Migraines; Depression; Carcinoid tumor of ovary; Mature teratoma; Obesity, Class III, BMI 40-49.9 (morbid obesity) ([place]); Anxiety; Female infertility; Right ovarian cyst; Family history of breast cancer; Family history of prostate cancer; History of right salpingo-oophorectomy; COVID-19 affecting pregnancy in first trimester; Supervision of other normal pregnancy, antepartum; Obesity affecting pregnancy; History of postpartum depression; and GDM, class A2 on their problem list.  Patient reports occasional contractions.  Contractions: Not present. Vag. Bleeding: None.  Movement: Present. Denies leaking of fluid.   The following portions of the patient's history were reviewed and updated as appropriate: allergies, current medications, past family history, past medical history, past social history, past surgical history and problem list.   Objective:   Vitals:   02/17/20 1349  BP: 113/72  Pulse: 88  Weight: 251 lb (113.9 kg)    Fetal Status: Fetal Heart Rate (bpm): 137   Movement: Present  Presentation: Vertex  General:  Alert, oriented and cooperative. Patient is in no acute distress.  Skin: Skin is warm and dry. No rash noted.   Cardiovascular: Normal heart rate noted  Respiratory: Normal respiratory effort, no problems with respiration noted  Abdomen: Soft, gravid, appropriate for gestational age.  Pain/Pressure: Absent     Pelvic: Cervical exam deferred        Extremities: Normal range of motion.  Edema: None  Mental Status: Normal mood and affect. Normal behavior. Normal judgment and thought content.   Assessment and Plan:  Pregnancy: G3P2002 at [redacted]w[redacted]d 1. Supervision of other normal pregnancy, antepartum --Anticipatory guidance about next visits/weeks of pregnancy  given. --Next visit in 1 week in office --Pt desires waterbirth and is aware that EFW and other concerns can lead to provider discretion withholding immersion in the tub.  She mostly desires to labor on her own and avoid IOL. Discussed reasons for recommended IOL at 38-39 weeks for GDM on medications.  Shared decision making and pt to return to office in 1 week. --Pt is using EPO and raspberry leaf tea, trying the Marathon Oil, and walking and resting daily to prepare for labor.  2. GDM, class A2 --On Metformin Q HS only --Fastings all below 95, PP with 3 out of 21 above 120, all below 130 --Continue current course --If no other complications and good fetal movement, pt declines IOL at this time, and desires follow up next week in the office.   3. History of postpartum depression, currently pregnant --Doing well, stable.   4. Fatigue, unspecified type --Pt with more fatigue in recent weeks than with her first 2 pregnancies.  She did not have 28 week labs since she already had dx of GDM.  --CBC, TSH today  Term labor symptoms and general obstetric precautions including but not limited to vaginal bleeding, contractions, leaking of fluid and fetal movement were reviewed in detail with the patient. Please refer to After Visit Summary for other counseling recommendations.   Return in about 1 week (around 02/24/2020).  Future Appointments  Date Time Provider [place]  02/23/2020  3:30 PM [facility] NURSE [facility]  02/23/2020  3:45 PM [facility] US1 [facility]  02/24/2020  9:55 AM [place]-Kirby, Kathie Dike, CNM [facility]    Fatima Blank, CNM

## 2020-02-17 NOTE — Patient Instructions (Signed)
Things to Try After 37 weeks to Encourage Labor/Get Ready for Labor:   1.  Try the Marathon Oil at [employer] daily to improve baby's position and encourage the onset of labor.  2. Walk a little and rest a little every day.  Change positions often.  3. Cervical Ripening: May try one or both a. Red Raspberry Leaf capsules or tea:  two 300mg  or 400mg  tablets with each meal, 2-3 times a day, or 1-3 cups of tea daily  Potential Side Effects Of Raspberry Leaf:  Most women do not experience any side effects from drinking raspberry leaf tea. However, nausea and loose stools are possible   b. Evening Primrose Oil capsules: may take 1 to 3 capsules daily. May also prick one to release the oil and insert it into your vagina at night.  Some of the potential side effects:  Upset stomach  Loose stools or diarrhea  Headaches  Nausea  4. Sex (and especially sex with orgasm) can also help the cervix ripen and encourage labor onset.    Labor Precautions Reasons to come to MAU at [facility] and [place]:  1.  Contractions are  5 minutes apart or less, each last 1 minute, these have been going on for 1-2 hours, and you cannot walk or talk during them 2.  You have a large gush of fluid, or a trickle of fluid that will not stop and you have to wear a pad 3.  You have bleeding that is bright red, heavier than spotting--like menstrual bleeding (spotting can be normal in early labor or after a check of your cervix) 4.  You do not feel the baby moving like he/she normally does

## 2020-02-18 LAB — TSH: TSH: 0.81 mIU/L

## 2020-02-18 LAB — CBC
HCT: 38.2 % (ref 35.0–45.0)
Hemoglobin: 11.8 g/dL (ref 11.7–15.5)
MCH: 25.6 pg — ABNORMAL LOW (ref 27.0–33.0)
MCHC: 30.9 g/dL — ABNORMAL LOW (ref 32.0–36.0)
MCV: 82.9 fL (ref 80.0–100.0)
MPV: 11.3 fL (ref 7.5–12.5)
Platelets: 231 10*3/uL (ref 140–400)
RBC: 4.61 10*6/uL (ref 3.80–5.10)
RDW: 14.5 % (ref 11.0–15.0)
WBC: 10.6 10*3/uL (ref 3.8–10.8)

## 2020-02-23 ENCOUNTER — Ambulatory Visit: Payer: Medicaid Other | Attending: Obstetrics

## 2020-02-23 ENCOUNTER — Encounter: Payer: Self-pay | Admitting: *Deleted

## 2020-02-23 ENCOUNTER — Ambulatory Visit: Payer: Medicaid Other | Admitting: *Deleted

## 2020-02-23 ENCOUNTER — Other Ambulatory Visit: Payer: Self-pay

## 2020-02-23 DIAGNOSIS — O24415 Gestational diabetes mellitus in pregnancy, controlled by oral hypoglycemic drugs: Secondary | ICD-10-CM

## 2020-02-23 DIAGNOSIS — Z8759 Personal history of other complications of pregnancy, childbirth and the puerperium: Secondary | ICD-10-CM | POA: Diagnosis present

## 2020-02-23 DIAGNOSIS — O99213 Obesity complicating pregnancy, third trimester: Secondary | ICD-10-CM | POA: Diagnosis not present

## 2020-02-23 DIAGNOSIS — Z8659 Personal history of other mental and behavioral disorders: Secondary | ICD-10-CM

## 2020-02-23 DIAGNOSIS — Z3480 Encounter for supervision of other normal pregnancy, unspecified trimester: Secondary | ICD-10-CM | POA: Diagnosis present

## 2020-02-23 DIAGNOSIS — O24419 Gestational diabetes mellitus in pregnancy, unspecified control: Secondary | ICD-10-CM | POA: Insufficient documentation

## 2020-02-23 DIAGNOSIS — Z362 Encounter for other antenatal screening follow-up: Secondary | ICD-10-CM | POA: Diagnosis not present

## 2020-02-23 DIAGNOSIS — Z3A38 38 weeks gestation of pregnancy: Secondary | ICD-10-CM

## 2020-02-24 ENCOUNTER — Ambulatory Visit (INDEPENDENT_AMBULATORY_CARE_PROVIDER_SITE_OTHER): Payer: Medicaid Other | Admitting: Advanced Practice Midwife

## 2020-02-24 VITALS — BP 109/72 | HR 90 | Wt 245.0 lb

## 2020-02-24 DIAGNOSIS — O0993 Supervision of high risk pregnancy, unspecified, third trimester: Secondary | ICD-10-CM

## 2020-02-24 DIAGNOSIS — O0990 Supervision of high risk pregnancy, unspecified, unspecified trimester: Secondary | ICD-10-CM

## 2020-02-24 DIAGNOSIS — O99213 Obesity complicating pregnancy, third trimester: Secondary | ICD-10-CM

## 2020-02-24 DIAGNOSIS — Z3A38 38 weeks gestation of pregnancy: Secondary | ICD-10-CM

## 2020-02-24 DIAGNOSIS — O24419 Gestational diabetes mellitus in pregnancy, unspecified control: Secondary | ICD-10-CM

## 2020-02-24 DIAGNOSIS — Z8759 Personal history of other complications of pregnancy, childbirth and the puerperium: Secondary | ICD-10-CM

## 2020-02-24 DIAGNOSIS — Z8616 Personal history of COVID-19: Secondary | ICD-10-CM

## 2020-02-24 DIAGNOSIS — O24415 Gestational diabetes mellitus in pregnancy, controlled by oral hypoglycemic drugs: Secondary | ICD-10-CM

## 2020-02-24 NOTE — Patient Instructions (Signed)
Labor Precautions Reasons to come to MAU at [facility] and [place]:  1.  Contractions are  5 minutes apart or less, each last 1 minute, these have been going on for 1-2 hours, and you cannot walk or talk during them 2.  You have a large gush of fluid, or a trickle of fluid that will not stop and you have to wear a pad 3.  You have bleeding that is bright red, heavier than spotting--like menstrual bleeding (spotting can be normal in early labor or after a check of your cervix) 4.  You do not feel the baby moving like he/she normally does  Things to Try After 37 weeks to Encourage Labor/Get Ready for Labor:   1.  Try the Marathon Oil at [employer] daily to improve baby's position and encourage the onset of labor.  2. Walk a little and rest a little every day.  Change positions often.  3. Cervical Ripening: May try one or both a. Red Raspberry Leaf capsules or tea:  two 300mg  or 400mg  tablets with each meal, 2-3 times a day, or 1-3 cups of tea daily  Potential Side Effects Of Raspberry Leaf:  Most women do not experience any side effects from drinking raspberry leaf tea. However, nausea and loose stools are possible   b. Evening Primrose Oil capsules: may take 1 to 3 capsules daily. Take 1-2 capsules by mouth each day and place one capsule vaginally at night.  You may also prick the vaginal capsule to release the oil prior to inserting in the vagina. Some of the potential side effects:  Upset stomach  Loose stools or diarrhea  Headaches  Nausea  4. Sex (and especially sex with orgasm or masturbation) can also help the cervix ripen and encourage labor onset.

## 2020-02-24 NOTE — Progress Notes (Signed)
   PRENATAL VISIT NOTE  Subjective:  [name] is a 34 y.o. G3P2002 at [redacted]w[redacted]d being seen today for ongoing prenatal care.  She is currently monitored for the following issues for this high-risk pregnancy and has Migraines; Depression; Carcinoid tumor of ovary; Mature teratoma; Obesity, Class III, BMI 40-49.9 (morbid obesity) ([place]); Anxiety; Female infertility; Right ovarian cyst; Family history of breast cancer; Family history of prostate cancer; History of right salpingo-oophorectomy; COVID-19 affecting pregnancy in first trimester; Supervision of other normal pregnancy, antepartum; Obesity affecting pregnancy; History of postpartum depression; and GDM, class A2 on their problem list.  Patient reports occasional contractions.  Contractions: Not present. Vag. Bleeding: None.  Movement: Present. Denies leaking of fluid.   The following portions of the patient's history were reviewed and updated as appropriate: allergies, current medications, past family history, past medical history, past social history, past surgical history and problem list.   Objective:   Vitals:   02/24/20 0947  BP: 109/72  Pulse: 90  Weight: 245 lb (111.1 kg)    Fetal Status: Fetal Heart Rate (bpm): 142   Movement: Present     General:  Alert, oriented and cooperative. Patient is in no acute distress.  Skin: Skin is warm and dry. No rash noted.   Cardiovascular: Normal heart rate noted  Respiratory: Normal respiratory effort, no problems with respiration noted  Abdomen: Soft, gravid, appropriate for gestational age.  Pain/Pressure: Present     Pelvic: Cervical exam performed in the presence of a chaperone        Extremities: Normal range of motion.  Edema: None  Mental Status: Normal mood and affect. Normal behavior. Normal judgment and thought content.   Assessment and Plan:  Pregnancy: G3P2002 at [redacted]w[redacted]d 1. GDM, class A2 -Reviewed glucose log. Fastings all below 95.  Pt taking Metformin 500 mg QHS as  prescribed.  7 out of 21 PP are in low 120s.  Discussed options with patient, and pt elects to add Metformin 500 mg each am for better coverage.     - [place] MFM FETAL BPP WO NON STRESS; Future  2. Supervision of high risk pregnancy, antepartum --Anticipatory guidance about next visits/weeks of pregnancy given. --Pt had BPP yesterday. Results not available but pt reports 8/8 with AFI of 12, an increase from low levels in the previous week. --Pt is aware of MFM and OB recommendations to induce labor with GDM A2 before 39 weeks.  She is well informed and aware of risks of GDM, including macrosomia, shoulder dystocia at delivery, and stillbirth.  Pt is feeling well with good fetal movement and desires to have OB appointment in our office next week before considering IOL.  With shared decision making, appointment made for next week for OB visit at 39 weeks and BPP with MFM. --Discussed IOL in detail, including options to give Cytotec, FB, AROM and delaying Pitocin if pt desires intermittent FHR monitoring with planned natural labor and maybe waterbirth. Pt states understanding.   Term labor symptoms and general obstetric precautions including but not limited to vaginal bleeding, contractions, leaking of fluid and fetal movement were reviewed in detail with the patient. Please refer to After Visit Summary for other counseling recommendations.   No follow-ups on file.  Future Appointments  Date Time Provider [place]  03/01/2020  2:45 PM [facility] US6 [facility]  03/02/2020  3:00 PM Leftwich-Kirby, Kathie Dike, CNM [facility]    Fatima Blank, CNM

## 2020-02-26 ENCOUNTER — Other Ambulatory Visit: Payer: Self-pay | Admitting: Obstetrics & Gynecology

## 2020-02-28 ENCOUNTER — Encounter (HOSPITAL_COMMUNITY): Payer: Self-pay | Admitting: Obstetrics & Gynecology

## 2020-02-28 ENCOUNTER — Other Ambulatory Visit: Payer: Self-pay

## 2020-02-28 ENCOUNTER — Inpatient Hospital Stay (HOSPITAL_COMMUNITY)
Admission: AD | Admit: 2020-02-28 | Discharge: 2020-03-01 | DRG: 807 | Disposition: A | Discharge status: Home / Self Care | Payer: Medicaid Other | Attending: Obstetrics and Gynecology | Admitting: Obstetrics and Gynecology

## 2020-02-28 DIAGNOSIS — Z8616 Personal history of COVID-19: Secondary | ICD-10-CM | POA: Diagnosis not present

## 2020-02-28 DIAGNOSIS — Z3A39 39 weeks gestation of pregnancy: Secondary | ICD-10-CM

## 2020-02-28 DIAGNOSIS — Z20822 Contact with and (suspected) exposure to covid-19: Secondary | ICD-10-CM | POA: Diagnosis present

## 2020-02-28 DIAGNOSIS — O98511 Other viral diseases complicating pregnancy, first trimester: Secondary | ICD-10-CM | POA: Diagnosis present

## 2020-02-28 DIAGNOSIS — Z90721 Acquired absence of ovaries, unilateral: Secondary | ICD-10-CM

## 2020-02-28 DIAGNOSIS — O99214 Obesity complicating childbirth: Secondary | ICD-10-CM | POA: Diagnosis present

## 2020-02-28 DIAGNOSIS — Z8659 Personal history of other mental and behavioral disorders: Secondary | ICD-10-CM

## 2020-02-28 DIAGNOSIS — O24425 Gestational diabetes mellitus in childbirth, controlled by oral hypoglycemic drugs: Principal | ICD-10-CM | POA: Diagnosis present

## 2020-02-28 DIAGNOSIS — O0990 Supervision of high risk pregnancy, unspecified, unspecified trimester: Secondary | ICD-10-CM

## 2020-02-28 DIAGNOSIS — D3A098 Benign carcinoid tumors of other sites: Secondary | ICD-10-CM | POA: Diagnosis present

## 2020-02-28 DIAGNOSIS — O24419 Gestational diabetes mellitus in pregnancy, unspecified control: Secondary | ICD-10-CM | POA: Diagnosis present

## 2020-02-28 DIAGNOSIS — Z3480 Encounter for supervision of other normal pregnancy, unspecified trimester: Secondary | ICD-10-CM

## 2020-02-28 DIAGNOSIS — O368130 Decreased fetal movements, third trimester, not applicable or unspecified: Secondary | ICD-10-CM | POA: Diagnosis present

## 2020-02-28 DIAGNOSIS — Z9079 Acquired absence of other genital organ(s): Secondary | ICD-10-CM

## 2020-02-28 DIAGNOSIS — O99210 Obesity complicating pregnancy, unspecified trimester: Secondary | ICD-10-CM | POA: Diagnosis present

## 2020-02-28 DIAGNOSIS — O99213 Obesity complicating pregnancy, third trimester: Secondary | ICD-10-CM

## 2020-02-28 DIAGNOSIS — U071 COVID-19: Secondary | ICD-10-CM | POA: Diagnosis present

## 2020-02-28 LAB — GLUCOSE, CAPILLARY: Glucose-Capillary: 149 mg/dL — ABNORMAL HIGH (ref 70–99)

## 2020-02-28 LAB — CBC
HCT: 36.2 % (ref 36.0–46.0)
Hemoglobin: 11.4 g/dL — ABNORMAL LOW (ref 12.0–15.0)
MCH: 25.4 pg — ABNORMAL LOW (ref 26.0–34.0)
MCHC: 31.5 g/dL (ref 30.0–36.0)
MCV: 80.8 fL (ref 80.0–100.0)
Platelets: 227 10*3/uL (ref 150–400)
RBC: 4.48 MIL/uL (ref 3.87–5.11)
RDW: 14.9 % (ref 11.5–15.5)
WBC: 12.6 10*3/uL — ABNORMAL HIGH (ref 4.0–10.5)
nRBC: 0 % (ref 0.0–0.2)

## 2020-02-28 LAB — TYPE AND SCREEN
ABO/RH(D): A POS
Antibody Screen: NEGATIVE

## 2020-02-28 LAB — SARS CORONAVIRUS 2 BY RT PCR (HOSPITAL ORDER, PERFORMED IN ~~LOC~~ HOSPITAL LAB): SARS Coronavirus 2: NEGATIVE

## 2020-02-28 MED ORDER — LACTATED RINGERS IV SOLN
500.0000 mL | INTRAVENOUS | Status: DC | PRN
  Administered 2020-02-29: 500 mL via INTRAVENOUS

## 2020-02-28 MED ORDER — ACETAMINOPHEN 325 MG PO TABS: 650.0000 mg | ORAL_TABLET | ORAL | Status: DC | PRN

## 2020-02-28 MED ORDER — OXYCODONE-ACETAMINOPHEN 5-325 MG PO TABS: 1.0000 | ORAL_TABLET | ORAL | Status: DC | PRN

## 2020-02-28 MED ORDER — OXYCODONE-ACETAMINOPHEN 5-325 MG PO TABS: 2.0000 | ORAL_TABLET | ORAL | Status: DC | PRN

## 2020-02-28 MED ORDER — HYDROXYZINE HCL 25 MG PO TABS: 25.0000 mg | ORAL_TABLET | Freq: Four times a day (QID) | ORAL | Status: DC | PRN

## 2020-02-28 MED ORDER — OXYTOCIN-SODIUM CHLORIDE 30-0.9 UT/500ML-% IV SOLN
2.5000 [IU]/h | INTRAVENOUS | Status: DC
  Filled 2020-02-28: qty 500

## 2020-02-28 MED ORDER — FLEET ENEMA 7-19 GM/118ML RE ENEM: 1.0000 | ENEMA | RECTAL | Status: DC | PRN

## 2020-02-28 MED ORDER — ONDANSETRON HCL 4 MG/2ML IJ SOLN: 4.0000 mg | Freq: Four times a day (QID) | INTRAMUSCULAR | Status: DC | PRN

## 2020-02-28 MED ORDER — LIDOCAINE HCL (PF) 1 % IJ SOLN: 30.0000 mL | INTRAMUSCULAR | Status: DC | PRN

## 2020-02-28 MED ORDER — LACTATED RINGERS IV SOLN: INTRAVENOUS | Status: DC

## 2020-02-28 MED ORDER — MISOPROSTOL 50MCG HALF TABLET
50.0000 ug | ORAL_TABLET | ORAL | Status: DC
  Administered 2020-02-28 – 2020-02-29 (×2): 50 ug via BUCCAL
  Filled 2020-02-28 (×2): qty 1

## 2020-02-28 MED ORDER — ZOLPIDEM TARTRATE 5 MG PO TABS: 5.0000 mg | ORAL_TABLET | Freq: Every evening | ORAL | Status: DC | PRN

## 2020-02-28 MED ORDER — OXYTOCIN BOLUS FROM INFUSION
333.0000 mL | Freq: Once | INTRAVENOUS | Status: AC
  Administered 2020-02-29: 333 mL via INTRAVENOUS

## 2020-02-28 MED ORDER — SOD CITRATE-CITRIC ACID 500-334 MG/5ML PO SOLN: 30.0000 mL | ORAL | Status: DC | PRN

## 2020-02-28 NOTE — MAU Note (Signed)
Pt reports to mau with c/o dfm since earlier today. Pt also reports her blood sugar at home were slightly higher than normal.  Denies vag bleeding, LOF, or CTX at this time.

## 2020-02-28 NOTE — Progress Notes (Signed)
Pt informed that the ultrasound is considered a limited OB ultrasound and is not intended to be a complete ultrasound exam.  Patient also informed that the ultrasound is not being completed with the intent of assessing for fetal or placental anomalies or any pelvic abnormalities.  Explained that the purpose of today's ultrasound is to assess for  presentation.  Patient acknowledges the purpose of the exam and the limitations of the study.    Presentation: Vertex, back towards maternal spine, head LOT  [name], Dan Europe, DO OB Fellow, Faculty [facility] 02/28/2020 10:08 PM

## 2020-02-28 NOTE — H&P (Addendum)
OBSTETRIC ADMISSION HISTORY AND PHYSICAL  [name] is a 34 y.o. female G3P2002 with IUP at [redacted]w[redacted]d by 6 week ultrasound presenting for IOL for A2GDM. She reports +FMs, No LOF, no VB, no blurry vision, headaches or peripheral edema, and RUQ pain.  She plans on breast feeding. She is planning vasectomy for birth control. She received her prenatal care at [place]  Dating: By 6 week u/s --->  Estimated Date of Delivery: 03/05/20  Sono:    @[redacted]w[redacted]d , CWD, normal anatomy, cephalic presentation, 4650P, 94% EFW   Nursing Staff Provider  Office Location  [facility] Dating  6 wk U/S   Language  English Anatomy US   normal but incomplete  Flu Vaccine   Genetic Screen  NIPS:Drawn x2 Insuff fetal DNA ordered  AFP:   First Screen:  Quad:    TDaP vaccine    Hgb A1C or  GTT Early A1C>5.7; early GTT>F-120 1-212 2-193  Rhogam   A positive    LAB RESULTS   Feeding Plan  Breast Blood Type A/RH(D) POSITIVE/-- (01/15 0900)   Contraception  Vasectomy (already done!!) Antibody NO ANTIBODIES DETECTED (01/15 0900)  Circumcision  girl  Rubella 1.98 (01/15 0900)  Pediatrician   [facility] RPR NON-REACTIVE (01/15 0900)   Support Person  [name] NON-REACTIVE (01/15 0900)   Prenatal Classes  HIV NON-REACTIVE (01/15 0900)  BTL Consent  GBS  (For PCN allergy, check sensitivities)   VBAC Consent  Pap 12/2018     Hgb Electro    BP [facility]     SMA     Waterbirth  [ ]  Class [ ]  Consent [ ]  CNM visit    Prenatal History/Complications: [id number]  Past Medical History: Past Medical History:  Diagnosis Date  . Anxiety   . Depression   . Family history of breast cancer   . Family history of prostate cancer   . Gestational diabetes 09/16/2019  . Infertility, female    clomid  . Migraines   . Ovarian cyst    dermoid   . PCOS (polycystic ovarian syndrome)   . Stomach problems    (migraines or spasms) since age 30     Past Surgical History: Past Surgical History:  Procedure Laterality Date  .  COLONOSCOPY  2011  . GALLBLADDER SURGERY    . IUD REMOVAL  01/14/2019  . LAPAROSCOPIC OVARIAN CYSTECTOMY Right 12/24/2013   Procedure: LAPAROSCOPIC OVARIAN CYSTECTOMY;  Surgeon: Azalia Bilis, MD;  Location: [facility];  Service: Gynecology;  Laterality: Right;  . ROBOTIC ASSISTED LAPAROSCOPIC OVARIAN CYSTECTOMY Left 01/23/2019   Procedure: XI ROBOTIC ASSISTED LAPAROSCOPIC LEFT  OVARIAN CYSTECTOMY;  Surgeon: Everitt Amber, MD;  Location: [facility];  Service: Gynecology;  Laterality: Left;  . ROBOTIC ASSISTED SALPINGO OOPHERECTOMY Right 01/23/2019   Procedure: XI ROBOTIC ASSISTED RIGHT SALPINGO OOPHORECTOMY;  Surgeon: Everitt Amber, MD;  Location: [facility];  Service: Gynecology;  Laterality: Right;  . UPPER GI ENDOSCOPY  2011  . WISDOM TOOTH EXTRACTION      Obstetrical History: OB History    Gravida  3   Para  2   Term  2   Preterm      AB      Living  2     SAB      TAB      Ectopic      Multiple  0   Live Births  2           Social History Social  History   Socioeconomic History  . Marital status: Married    Spouse name: Not on file  . Number of children: Not on file  . Years of education: Not on file  . Highest education level: Not on file  Occupational History  . Occupation: homemaker  Tobacco Use  . Smoking status: Never Smoker  . Smokeless tobacco: Never Used  Vaping Use  . Vaping Use: Never used  Substance and Sexual Activity  . Alcohol use: No  . Drug use: No  . Sexual activity: Yes    Partners: Male    Birth control/protection: None  Other Topics Concern  . Not on file  Social History Narrative  . Not on file   Social Determinants of Health   Financial Resource Strain:   . Difficulty of Paying Living Expenses:   Food Insecurity:   . Worried About Charity fundraiser in the Last Year:   . Arboriculturist in the Last Year:   Transportation Needs:   . Film/video editor (Medical):   Marland Kitchen Lack of Transportation (Non-Medical):   Physical Activity:    . Days of Exercise per Week:   . Minutes of Exercise per Session:   Stress:   . Feeling of Stress :   Social Connections:   . Frequency of Communication with Friends and Family:   . Frequency of Social Gatherings with Friends and Family:   . Attends Religious Services:   . Active Member of Clubs or Organizations:   . Attends Archivist Meetings:   Marland Kitchen Marital Status:     Family History: Family History  Problem Relation Age of Onset  . Thyroid disease Mother   . Hyperlipidemia Father   . Cancer Father        prostate cancer  . Breast cancer Maternal Grandmother 106       d. 63  . Cancer Maternal Grandmother        bone cancer  . Diabetes Maternal Grandfather   . Breast cancer Other        MGMs sister  . Breast cancer Other        MGMs sister  . Cancer Other        MGMs brother with bone cancer  . Anesthesia problems Neg Hx   . Hypotension Neg Hx   . Malignant hyperthermia Neg Hx   . Pseudochol deficiency Neg Hx     Allergies: Allergies  Allergen Reactions  . Augmentin [Amoxicillin-Pot Clavulanate] Swelling and Other (See Comments)    Facial swelling  . Penicillins Other (See Comments)    Reaction:  Unknown     Medications Prior to Admission  Medication Sig Dispense Refill Last Dose  . Accu-Chek FastClix Lancets MISC 1 Device by Percutaneous route 4 (four) times daily. 100 each 12   . Blood Pressure Monitoring (BLOOD PRESSURE CUFF) MISC 1 Device by Does not apply route as needed. 1 each 0   . Doxylamine-Pyridoxine (DICLEGIS PO) Take by mouth.  (Patient not taking: Reported on 02/17/2020)     . Doxylamine-Pyridoxine (DICLEGIS) 10-10 MG TBEC Take 2 tablets by mouth 2 (two) times daily. (Patient not taking: Reported on 02/17/2020) 120 tablet 6   . Famotidine (PEPCID PO) Take by mouth.     Marland Kitchen FLUoxetine (PROZAC) 20 MG capsule Take 1 capsule (20 mg total) by mouth daily. 90 capsule 4   . glucose blood (ACCU-CHEK GUIDE) test strip Use as instructed, 4 times a day  100 each 12   .  metFORMIN (GLUCOPHAGE) 500 MG tablet Take 1 tablet (500 mg total) by mouth 2 (two) times daily with a meal. 180 tablet 6   . Nutritional Supplements (JUICE PLUS FIBRE PO) Take 4 each by mouth daily.     . Prenatal Vit-Fe Fumarate-FA (PRENATAL MULTIVITAMIN) TABS tablet Take 1 tablet by mouth daily at 12 noon.      Review of Systems   All systems reviewed and negative except as stated in HPI  Blood pressure 116/70, pulse (!) 107, temperature 98.7 F (37.1 C), temperature source Oral, resp. rate 16, last menstrual period 05/24/2019, SpO2 98 %, currently breastfeeding. General appearance: alert, cooperative and no distress Lungs: clear to auscultation bilaterally Heart: regular rate and rhythm Abdomen: soft, non-tender; bowel sounds normal Pelvic: n/a Extremities: Homans sign is negative, no sign of DVT DTR's +2 Presentation: cephalic Fetal monitoringBaseline: 140 bpm, Variability: Good {> 6 bpm), Accelerations: Reactive and Decelerations: Absent Uterine activityNone     Prenatal labs: ABO, Rh: A/RH(D) POSITIVE/-- (01/15 0900) Antibody: NO ANTIBODIES DETECTED (01/15 0900) Rubella: 1.98 (01/15 0900) RPR: NON-REACTIVE (01/15 0900)  Thomas: NON-REACTIVE (01/15 0900)  HIV: NON-REACTIVE (01/15 0900)  GBS:   Negative  Prenatal Transfer Tool  Maternal Diabetes: Yes:  Diabetes Type:  Insulin/Medication controlled Genetic Screening: Normal Maternal Ultrasounds/Referrals: Normal Fetal Ultrasounds or other Referrals:  Referred to Materal Fetal Medicine  Maternal Substance Abuse:  No Significant Maternal Medications:  None Significant Maternal Lab Results: Group B Strep negative  No results found for this or any previous visit (from the past 24 hour(s)).  Patient Active Problem List   Diagnosis Date Noted  . GDM, class A2 09/16/2019  . Obesity affecting pregnancy 08/22/2019  . History of postpartum depression 08/22/2019  . Supervision of other normal pregnancy,  antepartum 08/21/2019  . COVID-19 affecting pregnancy in first trimester 08/04/2019  . History of right salpingo-oophorectomy 07/11/2019  . Family history of breast cancer   . Family history of prostate cancer   . Right ovarian cyst 09/26/2018  . Anxiety 03/06/2015  . 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 Obesity, Class III, BMI 40-49.9 (morbid obesity) ([place]) 02/11/2014  . 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 Migraines   . Depression     Assessment/Plan:  FLEUR AUDINO is a 34 y.o. G3P2002 at [redacted]w[redacted]d here for IOL A2GDM and decreased fetal movement  #Labor: planning cytotec and FB. Not a candidate for waterbirth due to metformin use for GDM #Pain: Planning natural #FWB: Cat 1 #ID:  GBS neg #MOF: Breast #MOC: vasectomy #Circ:  [name], CNM  02/28/2020, 5:56 PM

## 2020-02-29 ENCOUNTER — Inpatient Hospital Stay (HOSPITAL_COMMUNITY): Payer: Medicaid Other | Admitting: Anesthesiology

## 2020-02-29 DIAGNOSIS — Z3A39 39 weeks gestation of pregnancy: Secondary | ICD-10-CM

## 2020-02-29 DIAGNOSIS — O24425 Gestational diabetes mellitus in childbirth, controlled by oral hypoglycemic drugs: Secondary | ICD-10-CM

## 2020-02-29 LAB — GLUCOSE, CAPILLARY
Glucose-Capillary: 86 mg/dL (ref 70–99)
Glucose-Capillary: 106 mg/dL — ABNORMAL HIGH (ref 70–99)
Glucose-Capillary: 105 mg/dL — ABNORMAL HIGH (ref 70–99)

## 2020-02-29 LAB — RPR: RPR Ser Ql: NONREACTIVE

## 2020-02-29 MED ORDER — EPHEDRINE 5 MG/ML INJ: 10.0000 mg | INTRAVENOUS | Status: DC | PRN

## 2020-02-29 MED ORDER — DIPHENHYDRAMINE HCL 25 MG PO CAPS: 25.0000 mg | ORAL_CAPSULE | Freq: Four times a day (QID) | ORAL | Status: DC | PRN

## 2020-02-29 MED ORDER — FENTANYL CITRATE (PF) 100 MCG/2ML IJ SOLN
INTRAMUSCULAR | Status: AC
  Filled 2020-02-29: qty 2

## 2020-02-29 MED ORDER — TETANUS-DIPHTH-ACELL PERTUSSIS 5-2.5-18.5 LF-MCG/0.5 IM SUSP
0.5000 mL | Freq: Once | INTRAMUSCULAR | Status: AC
  Administered 2020-03-01: 0.5 mL via INTRAMUSCULAR
  Filled 2020-02-29: qty 0.5

## 2020-02-29 MED ORDER — ACETAMINOPHEN 325 MG PO TABS
650.0000 mg | ORAL_TABLET | Freq: Four times a day (QID) | ORAL | Status: DC | PRN
  Administered 2020-02-29 (×2): 650 mg via ORAL
  Filled 2020-02-29 (×2): qty 2

## 2020-02-29 MED ORDER — LIDOCAINE HCL (PF) 1 % IJ SOLN
INTRAMUSCULAR | Status: DC | PRN
  Administered 2020-02-29 (×2): 4 mL via EPIDURAL

## 2020-02-29 MED ORDER — IBUPROFEN 600 MG PO TABS
600.0000 mg | ORAL_TABLET | Freq: Three times a day (TID) | ORAL | Status: DC | PRN
  Administered 2020-02-29: 600 mg via ORAL
  Filled 2020-02-29 (×2): qty 1

## 2020-02-29 MED ORDER — ONDANSETRON HCL 4 MG PO TABS: 4.0000 mg | ORAL_TABLET | ORAL | Status: DC | PRN

## 2020-02-29 MED ORDER — FENTANYL-BUPIVACAINE-NACL 0.5-0.125-0.9 MG/250ML-% EP SOLN
12.0000 mL/h | EPIDURAL | Status: DC | PRN
  Filled 2020-02-29: qty 250

## 2020-02-29 MED ORDER — ONDANSETRON HCL 4 MG/2ML IJ SOLN: 4.0000 mg | INTRAMUSCULAR | Status: DC | PRN

## 2020-02-29 MED ORDER — SODIUM CHLORIDE (PF) 0.9 % IJ SOLN
INTRAMUSCULAR | Status: DC | PRN
  Administered 2020-02-29: 12 mL/h via EPIDURAL

## 2020-02-29 MED ORDER — PHENYLEPHRINE 40 MCG/ML (10ML) SYRINGE FOR IV PUSH (FOR BLOOD PRESSURE SUPPORT)
80.0000 ug | PREFILLED_SYRINGE | INTRAVENOUS | Status: DC | PRN
  Filled 2020-02-29: qty 10

## 2020-02-29 MED ORDER — SIMETHICONE 80 MG PO CHEW: 80.0000 mg | CHEWABLE_TABLET | ORAL | Status: DC | PRN

## 2020-02-29 MED ORDER — WITCH HAZEL-GLYCERIN EX PADS: 1.0000 "application " | MEDICATED_PAD | CUTANEOUS | Status: DC | PRN

## 2020-02-29 MED ORDER — LACTATED RINGERS IV SOLN
500.0000 mL | Freq: Once | INTRAVENOUS | Status: AC
  Administered 2020-02-29: 500 mL via INTRAVENOUS

## 2020-02-29 MED ORDER — FENTANYL CITRATE (PF) 100 MCG/2ML IJ SOLN
100.0000 ug | INTRAMUSCULAR | Status: DC | PRN
  Administered 2020-02-29: 100 ug via INTRAVENOUS
  Filled 2020-02-29 (×2): qty 2

## 2020-02-29 MED ORDER — LACTATED RINGERS IV SOLN: 500.0000 mL | Freq: Once | INTRAVENOUS | Status: DC

## 2020-02-29 MED ORDER — MEASLES, MUMPS & RUBELLA VAC IJ SOLR: 0.5000 mL | Freq: Once | INTRAMUSCULAR | Status: DC

## 2020-02-29 MED ORDER — BENZOCAINE-MENTHOL 20-0.5 % EX AERO: 1.0000 "application " | INHALATION_SPRAY | CUTANEOUS | Status: DC | PRN

## 2020-02-29 MED ORDER — DIBUCAINE (PERIANAL) 1 % EX OINT: 1.0000 "application " | TOPICAL_OINTMENT | CUTANEOUS | Status: DC | PRN

## 2020-02-29 MED ORDER — DIPHENHYDRAMINE HCL 50 MG/ML IJ SOLN: 12.5000 mg | INTRAMUSCULAR | Status: DC | PRN

## 2020-02-29 MED ORDER — PHENYLEPHRINE 40 MCG/ML (10ML) SYRINGE FOR IV PUSH (FOR BLOOD PRESSURE SUPPORT): 80.0000 ug | PREFILLED_SYRINGE | INTRAVENOUS | Status: DC | PRN

## 2020-02-29 MED ORDER — LIDOCAINE-EPINEPHRINE (PF) 2 %-1:200000 IJ SOLN
INTRAMUSCULAR | Status: DC | PRN
  Administered 2020-02-29: 7 mL via EPIDURAL

## 2020-02-29 MED ORDER — FLUOXETINE HCL 20 MG PO CAPS
20.0000 mg | ORAL_CAPSULE | Freq: Every day | ORAL | Status: DC
  Administered 2020-02-29 – 2020-03-01 (×2): 20 mg via ORAL
  Filled 2020-02-29 (×2): qty 1

## 2020-02-29 MED ORDER — SENNOSIDES-DOCUSATE SODIUM 8.6-50 MG PO TABS
2.0000 | ORAL_TABLET | ORAL | Status: DC
  Administered 2020-03-01: 2 via ORAL
  Filled 2020-02-29: qty 2

## 2020-02-29 MED ORDER — PRENATAL MULTIVITAMIN CH
1.0000 | ORAL_TABLET | Freq: Every day | ORAL | Status: DC
  Filled 2020-02-29 (×2): qty 1

## 2020-02-29 MED ORDER — COCONUT OIL OIL: 1.0000 "application " | TOPICAL_OIL | Status: DC | PRN

## 2020-02-29 NOTE — Anesthesia Procedure Notes (Signed)
Epidural Patient location during procedure: OB Start time: 02/29/2020 9:27 AM End time: 02/29/2020 9:32 AM  Staffing Anesthesiologist: Brennan Bailey, MD Performed: anesthesiologist   Preanesthetic Checklist Completed: patient identified, IV checked, risks and benefits discussed, monitors and equipment checked, pre-op evaluation and timeout performed  Epidural Patient position: sitting Prep: DuraPrep and site prepped and draped Patient monitoring: continuous pulse ox, blood pressure and heart rate Approach: midline Location: L2-L3 Injection technique: LOR air  Needle:  Needle type: Tuohy  Needle gauge: 17 G Needle length: 9 cm Needle insertion depth: 8 cm Catheter type: closed end flexible Catheter size: 19 Gauge Catheter at skin depth: 13 cm Test dose: negative and Other (1% lidocaine)  Assessment Events: blood not aspirated, injection not painful, no injection resistance, no paresthesia and negative IV test  Additional Notes Patient identified. Risks, benefits, and alternatives discussed with patient including but not limited to bleeding, infection, nerve damage, paralysis, failed block, incomplete pain control, headache, blood pressure changes, nausea, vomiting, reactions to medication, itching, and postpartum back pain. Confirmed with bedside nurse the patient's most recent platelet count. Confirmed with patient that they are not currently taking any anticoagulation, have any bleeding history, or any family history of bleeding disorders. Patient expressed understanding and wished to proceed. All questions were answered. Sterile technique was used throughout the entire procedure. Please see nursing notes for vital signs.   2 attempts required, some difficulty due to patient movement and habitus. Crisp LOR at L2-3 level. Test dose was given through epidural catheter and negative prior to continuing to dose epidural or start infusion. Warning signs of high block given to the  patient including shortness of breath, tingling/numbness in hands, complete motor block, or any concerning symptoms with instructions to call for help. Patient was given instructions on fall risk and not to get out of bed. All questions and concerns addressed with instructions to call with any issues or inadequate analgesia.  Reason for block:procedure for pain

## 2020-02-29 NOTE — Progress Notes (Signed)
SECRET [name] MRN: [id number]  Subjective: -Patient resting in bed, but is alert.  Reports good fetal movement and ocassional contractions, but feels they are not graphing.  Patient states she is unable to sleep.  Patient anticipating female infant who she plans to name Shiloh.   Objective: BP (!) 132/87   Pulse 79   Temp 98 F (36.7 C) (Oral)   Resp 18   LMP 05/24/2019 (Exact Date)   SpO2 98%  No intake/output data recorded. No intake/output data recorded.  Results for orders placed or performed during the hospital encounter of 02/28/20 (from the past 24 hour(s))  CBC     Status: Abnormal   Collection Time: 02/28/20  6:22 PM  Result Value Ref Range   WBC 12.6 (H) 4.0 - 10.5 K/uL   RBC 4.48 3.87 - 5.11 MIL/uL   Hemoglobin 11.4 (L) 12.0 - 15.0 g/dL   HCT 36.2 36 - 46 %   MCV 80.8 80.0 - 100.0 fL   MCH 25.4 (L) 26.0 - 34.0 pg   MCHC 31.5 30.0 - 36.0 g/dL   RDW 14.9 11.5 - 15.5 %   Platelets 227 150 - 400 K/uL   nRBC 0.0 0.0 - 0.2 %  Type and screen [place]     Status: None   Collection Time: 02/28/20  6:30 PM  Result Value Ref Range   ABO/RH(D) A POS    Antibody Screen NEG    Sample Expiration      03/02/2020,2359 Performed at [facility], [place] [address] [place], [place] [address]   SARS Coronavirus 2 by RT PCR (hospital order, performed in [facility] hospital lab) Nasopharyngeal Nasopharyngeal Swab     Status: None   Collection Time: 02/28/20  6:46 PM   Specimen: Nasopharyngeal Swab  Result Value Ref Range   SARS Coronavirus 2 NEGATIVE NEGATIVE  Glucose, capillary     Status: Abnormal   Collection Time: 02/28/20 10:09 PM  Result Value Ref Range   Glucose-Capillary 149 (H) 70 - 99 mg/dL    Fetal Monitoring: FHT: 135 bpm, Mod Var, -Decels, +Accels UC: None graphed or palpated    Vaginal Exam: SVE:    Deferred and [place] confirms Vertex Membranes:Intact Internal Monitors: None  Augmentation/Induction: Pitocin:None Cytotec: 1st Dose  given, 2nd Dose to be given at 2am.  Assessment:  IUP at 39.2weeks Cat I FT  IOL s/t GDM-A2  Plan: -Patient updated on POC to include continued cytotec dosing. -Nurse instructed to call with questions or concerns. -CBG due at ~0215 -Continue other mgmt as ordered   Riley Churches, [place] Provider, [facility] [place] 02/29/2020, 2:11 AM

## 2020-02-29 NOTE — Progress Notes (Signed)
Labor Progress Note [name] is a 34 y.o. G3P2002 at 72w2dpresented for IOL for GDMA2. S: Very uncomfortable with ctx right now. Desires epidural. Met patient and discussed plan.   O:  BP (!) 132/87   Pulse 79   Temp 98 F (36.7 C) (Oral)   Resp 18   LMP 05/24/2019 (Exact Date)   SpO2 98%  EFM: 145, moderate variability, pos accels, no decels, reactive TOCO: difficult to trace currently   CVE: Dilation: 1.5 Effacement (%): 60, 70 Cervical Position: Posterior Station: -3 Presentation: Vertex Exam by:: Kvernon, RNC   A&P: 34y.o. [id number] for [place]#Labor: S/p Cytotec x2. Patient desires epidural now. Will plan to recheck after epidural and place FB and start Pitocin. Likely AROM once FB out. Anticipate SVD. #Pain: desires epidural  #FWB: Cat I #GBS negative #GDMA2: last glucose 106  ChChauncey MannMD 8:48 AM

## 2020-02-29 NOTE — Discharge Summary (Signed)
Postpartum Discharge Summary    Patient Name: [name] DOB: [birth year] MRN: [id number]  Date of admission: 02/28/2020 Delivery date:02/29/2020  Delivering provider: Chauncey Mann  Date of discharge: 03/01/2020  Admitting diagnosis: Gestational diabetes [O24.419] Intrauterine pregnancy: [redacted]w[redacted]d    Secondary diagnosis:  Active Problems:   Carcinoid tumor of ovary   Obesity, Class III, BMI 40-49.9 (morbid obesity) ([place]   History of right salpingo-oophorectomy   COVID-19 affecting pregnancy in first trimester   Supervision of high-risk pregnancy   Obesity affecting pregnancy   History of postpartum depression   GDM, class A2   Gestational diabetes  Additional problems: None    Discharge diagnosis: Term Pregnancy Delivered and GDM A2                                              Post partum procedures:None Augmentation: Cytotec Complications: None  Hospital course: Induction of Labor With Vaginal Delivery   34y.o. yo G3P2002 at 347w2das admitted to the hospital 02/28/2020 for induction of labor.  Indication for induction: A2 DM.  Patient had an uncomplicated labor course as follows: Initial SVE: 1.5/60/-3. Patient received Cytotec x2. She then labored on her own and quickly progressed to complete after SROM.  Membrane Rupture Time/Date: 11:53 AM ,02/29/2020   Delivery Method:Vaginal, Spontaneous  Episiotomy: None  Lacerations:  1st degree  Details of delivery can be found in separate delivery note. Fasting AM glucose 114. Patient had a routine postpartum course. Patient is discharged home 03/01/20.  Newborn Data: Birth date:02/29/2020  Birth time:11:55 AM  Gender:Female  Living status:Living  Apgars:7 ,9  Weight:3634 g   Magnesium Sulfate received: No BMZ received: No Rhophylac:N/A MMR:N/A T-DaP:unknown Flu: No Transfusion:No  Physical exam  Vitals:   02/29/20 1829 02/29/20 2235 03/01/20 0123 03/01/20 0536  BP: 127/78 (!) 134/83 (!) 132/80 (!) 127/88  Pulse:  101 91 79   Resp: 16 18 18 18   Temp: 98.4 F (36.9 C) 98.6 F (37 C) 98.1 F (36.7 C) 98.5 F (36.9 C)  TempSrc: Axillary Oral Oral   SpO2: 100% 100% 99% 100%   General: alert, cooperative and no distress Lochia: appropriate Uterine Fundus: firm Incision: N/A DVT Evaluation: No evidence of DVT seen on physical exam. No significant calf/ankle edema. Labs: Lab Results  Component Value Date   WBC 12.6 (H) 02/28/2020   HGB 11.4 (L) 02/28/2020   HCT 36.2 02/28/2020   MCV 80.8 02/28/2020   PLT 227 02/28/2020   CMP Latest Ref Rng & Units 08/03/2019  Glucose 70 - 99 mg/dL 90  BUN 6 - 20 mg/dL 11  Creatinine 0.44 - 1.00 mg/dL 0.61  Sodium 135 - 145 mmol/L 134(L)  Potassium 3.5 - 5.1 mmol/L 3.6  Chloride 98 - 111 mmol/L 101  CO2 22 - 32 mmol/L 22  Calcium 8.9 - 10.3 mg/dL 9.2  Total Protein 6.5 - 8.1 g/dL 6.7  Total Bilirubin 0.3 - 1.2 mg/dL 0.3  Alkaline Phos 38 - 126 U/L 44  AST 15 - 41 U/L 21  ALT 0 - 44 U/L 24   Edinburgh Score: Edinburgh Postnatal Depression Scale Screening Tool 02/29/2020  I have been able to laugh and see the funny side of things. 0  I have looked forward with enjoyment to things. 0  I have blamed myself unnecessarily when things went wrong. 2  I have  been anxious or worried for no good reason. 0  I have felt scared or panicky for no good reason. 0  Things have been getting on top of me. 1  I have been so unhappy that I have had difficulty sleeping. 0  I have felt sad or miserable. 0  I have been so unhappy that I have been crying. 0  The thought of harming myself has occurred to me. 0  Edinburgh Postnatal Depression Scale Total 3     After visit meds:  Allergies as of 03/01/2020      Reactions   Augmentin [amoxicillin-pot Clavulanate] Swelling, Other (See Comments)   Facial swelling   Penicillins Other (See Comments)   Reaction:  Unknown       Medication List    STOP taking these medications   Accu-Chek FastClix Lancets Misc    Accu-Chek Guide test strip Generic drug: glucose blood   Doxylamine-Pyridoxine 10-10 MG Tbec Commonly known as: Diclegis   JUICE PLUS FIBRE PO   metFORMIN 500 MG tablet Commonly known as: Glucophage     TAKE these medications   acetaminophen 325 MG tablet Commonly known as: Tylenol Take 2 tablets (650 mg total) by mouth every 6 (six) hours as needed (for pain scale < 4).   Blood Pressure Cuff Misc 1 Device by Does not apply route as needed.   FLUoxetine 20 MG capsule Commonly known as: PROZAC Take 1 capsule (20 mg total) by mouth daily.   ibuprofen 600 MG tablet Commonly known as: ADVIL Take 1 tablet (600 mg total) by mouth every 8 (eight) hours as needed for mild pain.   PEPCID PO Take 1 tablet by mouth daily.   polyethylene glycol powder 17 GM/SCOOP powder Commonly known as: GLYCOLAX/MIRALAX Take 17 g by mouth daily as needed.   prenatal multivitamin Tabs tablet Take 1 tablet by mouth daily at 12 noon.        Discharge home in stable condition Infant Feeding: Breast Infant Disposition:home with mother Discharge instruction: per After Visit Summary and Postpartum booklet. Activity: Advance as tolerated. Pelvic rest for 6 weeks.  Diet: routine diet Future Appointments: No future appointments. Follow up Visit:   Please schedule this patient for a In person postpartum visit in 4 weeks with the following provider: Any provider. Additional Postpartum F/U:Postpartum Depression checkup and 2 hour GTT  High risk pregnancy complicated by: GDM Delivery mode:  Vaginal, Spontaneous  Anticipated Birth Control:  partner vasectomy, counseled on vasectomy   03/01/2020 Clarnce Flock, MD

## 2020-02-29 NOTE — Anesthesia Preprocedure Evaluation (Signed)
Anesthesia Evaluation  Patient identified by MRN, date of birth, ID band Patient awake    Reviewed: Allergy & Precautions, Patient's Chart, lab work & pertinent test results  History of Anesthesia Complications Negative for: history of anesthetic complications  Airway Mallampati: II  TM Distance: >3 FB Neck ROM: Full    Dental no notable dental hx.    Pulmonary neg pulmonary ROS,    Pulmonary exam normal        Cardiovascular negative cardio ROS Normal cardiovascular exam     Neuro/Psych  Headaches, Anxiety Depression    GI/Hepatic negative GI ROS, Neg liver ROS,   Endo/Other  diabetes, GestationalMorbid obesity  Renal/GU negative Renal ROS  negative genitourinary   Musculoskeletal negative musculoskeletal ROS (+)   Abdominal   Peds  Hematology negative hematology ROS (+)   Anesthesia Other Findings Day of surgery medications reviewed with patient.  Reproductive/Obstetrics (+) Pregnancy                             Anesthesia Physical Anesthesia Plan  ASA: III  Anesthesia Plan: Epidural   Post-op Pain Management:    Induction:   PONV Risk Score and Plan: Treatment may vary due to age or medical condition  Airway Management Planned: Natural Airway  Additional Equipment:   Intra-op Plan:   Post-operative Plan:   Informed Consent: I have reviewed the patients History and Physical, chart, labs and discussed the procedure including the risks, benefits and alternatives for the proposed anesthesia with the patient or authorized representative who has indicated his/her understanding and acceptance.       Plan Discussed with:   Anesthesia Plan Comments:         Anesthesia Quick Evaluation

## 2020-03-01 ENCOUNTER — Other Ambulatory Visit: Payer: Self-pay | Admitting: Advanced Practice Midwife

## 2020-03-01 ENCOUNTER — Ambulatory Visit: Payer: Medicaid Other

## 2020-03-01 ENCOUNTER — Encounter (HOSPITAL_COMMUNITY): Payer: Self-pay | Admitting: Obstetrics & Gynecology

## 2020-03-01 ENCOUNTER — Other Ambulatory Visit: Payer: Self-pay | Admitting: Certified Nurse Midwife

## 2020-03-01 LAB — GLUCOSE, CAPILLARY: Glucose-Capillary: 114 mg/dL — ABNORMAL HIGH (ref 70–99)

## 2020-03-01 MED ORDER — NONFORMULARY OR COMPOUNDED ITEM
1 refills | Status: DC
Start: 2020-03-01 — End: 2021-07-09

## 2020-03-01 MED ORDER — ACETAMINOPHEN 325 MG PO TABS: 650.0000 mg | ORAL_TABLET | Freq: Four times a day (QID) | ORAL | 0 refills | Status: DC | PRN

## 2020-03-01 MED ORDER — POLYETHYLENE GLYCOL 3350 17 GM/SCOOP PO POWD
17.0000 g | Freq: Every day | ORAL | 1 refills | Status: DC | PRN
Start: 2020-03-01 — End: 2020-03-30

## 2020-03-01 MED ORDER — FLUOXETINE HCL 20 MG PO CAPS: 20.0000 mg | ORAL_CAPSULE | Freq: Every day | ORAL | 1 refills | Status: DC

## 2020-03-01 MED ORDER — IBUPROFEN 600 MG PO TABS: 600.0000 mg | ORAL_TABLET | Freq: Three times a day (TID) | ORAL | 0 refills | Status: DC | PRN

## 2020-03-01 NOTE — Progress Notes (Signed)
Rx sent to 2 pharmacies, All Purpose Nipple Ointment to [facility] to compound and Prozac renewal Rx to [facility] in [place].  Message sent to pt regarding medications.

## 2020-03-01 NOTE — Anesthesia Postprocedure Evaluation (Signed)
Anesthesia Post Note  Patient: [name]  Procedure(s) Performed: AN AD [place]     Patient location during evaluation: Mother Baby Anesthesia Type: Epidural Level of consciousness: awake Pain management: satisfactory to patient Vital Signs Assessment: post-procedure vital signs reviewed and stable Respiratory status: spontaneous breathing Cardiovascular status: stable Anesthetic complications: no   No complications documented.  Last Vitals:  Vitals:   03/01/20 0123 03/01/20 0536  BP: (!) 132/80 (!) 127/88  Pulse: 79   Resp: 18 18  Temp: 36.7 C 36.9 C  SpO2: 99% 100%    Last Pain:  Vitals:   03/01/20 0806  TempSrc:   PainSc: 0-No pain   Pain Goal: Patients Stated Pain Goal: 0 (02/28/20 1806)              Epidural/Spinal Function Cutaneous sensation: Normal sensation (03/01/20 0806)  Casimer Lanius

## 2020-03-01 NOTE — Progress Notes (Signed)
CSW received consult for hx of Anxiety and Depression.  CSW met with MOB to offer support and complete assessment.    CSW met with MOB at bedside to discuss consult for history of anxiety/depression, FOB present. CSW asked FOB to leave the room to speak with MOB privately, FOB left the room. CSW introduced self and explained reason for consult. MOB was welcoming, pleasant and remained engaged throughout assessment. CSW and MOB discussed MOB's mental health history. MOB reported that she had a history of postpartum depression in 2016 after having her son. MOB reported that her symptoms started around 6 months after giving birth and described her symptoms as feeling down, not feeling like herself and lack of motivation. MOB reported that she is unable to recall how [place] her symptoms lasted but after starting Prozac her symptoms subsided. MOB reported that she is still taking Prozac and will continue to take her medication. CSW inquired about how MOB was feeling emotionally after giving birth, MOB reported that she is feeling good and ready to return home. CSW acknowledged and validated MOB's readiness to return home. MOB reported that they have all items needed to care for infant. CSW inquired about MOB's support system, MOB reported that her husband and parents are supports. MOB presented calm and did not demonstrate any acute mental health signs/symptoms. CSW assessed for safety, MOB denied SI, HI and domestic violence.   CSW provided education regarding the baby blues period vs. perinatal mood disorders, discussed treatment and gave resources for mental health follow up if concerns arise.  CSW recommends self-evaluation during the postpartum time period using the New Mom Checklist from Postpartum Progress and encouraged MOB to contact a medical professional if symptoms are noted at any time.    CSW provided review of Sudden Infant Death Syndrome (SIDS) precautions.    CSW identifies no further need for  intervention and no barriers to discharge at this time.  Abundio Miu, [place] Worker [facility] Cell#: [phone]

## 2020-03-01 NOTE — Discharge Instructions (Signed)

## 2020-03-02 ENCOUNTER — Encounter: Payer: Medicaid Other | Admitting: Advanced Practice Midwife

## 2020-03-30 ENCOUNTER — Encounter: Payer: Self-pay | Admitting: Certified Nurse Midwife

## 2020-03-30 ENCOUNTER — Other Ambulatory Visit: Payer: Self-pay

## 2020-03-30 ENCOUNTER — Ambulatory Visit (INDEPENDENT_AMBULATORY_CARE_PROVIDER_SITE_OTHER): Payer: Medicaid Other | Admitting: Certified Nurse Midwife

## 2020-03-30 DIAGNOSIS — O9089 Other complications of the puerperium, not elsewhere classified: Secondary | ICD-10-CM

## 2020-03-30 DIAGNOSIS — R519 Headache, unspecified: Secondary | ICD-10-CM

## 2020-03-30 NOTE — Progress Notes (Signed)
[place] Partum Visit Note  [name] is a 34 y.o. G27P2002 female who presents for a postpartum visit. She is 4 weeks postpartum following a normal spontaneous vaginal delivery.  I have fully reviewed the prenatal and intrapartum course. The delivery was at 39.2 gestational weeks.  Anesthesia: epidural. Postpartum course has been unremarkable. Baby is doing well. Baby is feeding by breast. Bleeding staining only. Bowel function is normal. Bladder function is normal. Patient is not sexually active. Contraception method is vasectomy. Postpartum depression screening: negative.   The pregnancy intention screening data noted above was reviewed. Potential methods of contraception were discussed. The patient elected to proceed with Vasectomy.    Edinburgh Postnatal Depression Scale - 03/30/20 1015      Edinburgh Postnatal Depression Scale:  In the Past 7 Days   I have been able to laugh and see the funny side of things. 0    I have looked forward with enjoyment to things. 0    I have blamed myself unnecessarily when things went wrong. 0    I have been anxious or worried for no good reason. 1    I have felt scared or panicky for no good reason. 0    Things have been getting on top of me. 1    I have been so unhappy that I have had difficulty sleeping. 0    I have felt sad or miserable. 0    I have been so unhappy that I have been crying. 0    The thought of harming myself has occurred to me. 0    Edinburgh Postnatal Depression Scale Total 2            The following portions of the patient's history were reviewed and updated as appropriate: allergies, current medications, past medical history and problem list.  Review of Systems Pertinent items noted in HPI and remainder of comprehensive ROS otherwise negative.    Objective:  Blood pressure 113/69, pulse 75, resp. rate 16, height 5\' 4"  (1.626 m), weight 227 lb (103 kg), currently breastfeeding.  General:  alert, cooperative and no  distress   Breasts:  inspection negative, no nipple discharge or bleeding, no masses or nodularity palpable  Lungs: clear to auscultation bilaterally  Heart:  regular rate and rhythm  Abdomen: soft, non-tender; bowel sounds normal; no masses,  no organomegaly   Vulva:  normal  Vagina: vagina positive for 0.5cm granulation tissue, laceration healed  Cervix:  anteverted  Corpus: not examined  Adnexa:  not evaluated  Rectal Exam: Not performed.        Assessment/Plan:  1. Postpartum care and examination - Normal postpartum exam. Pap smear not done at today's visit.  - Patient reports hx of needing pelvic floor therapy after last delivery, currently using home techniques learned at home. Offered patient pelvic floor therapy for urinary stress incontinence. Patient declines at this time but will reevaluate in the next couple of weeks.   2. Headache in pregnancy, postpartum - Patient reports occasional HAs since delivery, patient reports taking ibuprofen for HA. Reports tylenol not working. Offered stronger medication such as Fioricet. Patient declines as she does not like taking medication.  - Discussed HA specialist with patient if HAs do not resolve once patient is out of PP period, patient verbalizes understanding.   Plan:   Essential components of care per ACOG recommendations:  1.  Mood and well being: Patient with negative depression screening today. Reviewed local resources for support.  -  Patient does not use tobacco. - hx of drug use? No   2. Infant care and feeding:  -Patient currently breastmilk feeding? Yes Reviewed importance of draining breast regularly to support lactation. -Social determinants of health (SDOH) reviewed in EPIC. No concerns  3. Sexuality, contraception and birth spacing - Patient does not want a pregnancy in the next year.  Desired family size is 3 children.  - Reviewed forms of contraception in tiered fashion. Patient husband received vasectomy.    4.  Sleep and fatigue -Encouraged family/partner/community support of 4 hrs of uninterrupted sleep to help with mood and fatigue  5. Physical Recovery  - Discussed patients delivery and complications - Patient had a 1st degree laceration, perineal healing reviewed. Patient expressed understanding - Patient has urinary incontinence? Yes , patient declines pelvic floor PT at this time  - Patient is safe to resume physical and sexual activity   Lajean Manes, [place] for [employer], [place]

## 2020-03-30 NOTE — Patient Instructions (Signed)

## 2020-04-16 ENCOUNTER — Other Ambulatory Visit: Payer: Medicaid Other

## 2020-06-15 ENCOUNTER — Encounter: Payer: Self-pay | Admitting: Obstetrics & Gynecology

## 2021-07-06 ENCOUNTER — Ambulatory Visit (INDEPENDENT_AMBULATORY_CARE_PROVIDER_SITE_OTHER): Payer: Medicaid Other | Admitting: Obstetrics & Gynecology

## 2021-07-06 ENCOUNTER — Other Ambulatory Visit (HOSPITAL_COMMUNITY)
Admission: RE | Admit: 2021-07-06 | Discharge: 2021-07-06 | Disposition: A | Discharge status: Home / Self Care | Payer: Medicaid Other | Source: Ambulatory Visit | Attending: Obstetrics & Gynecology | Admitting: Obstetrics & Gynecology

## 2021-07-06 ENCOUNTER — Encounter (HOSPITAL_BASED_OUTPATIENT_CLINIC_OR_DEPARTMENT_OTHER): Payer: Self-pay | Admitting: Obstetrics & Gynecology

## 2021-07-06 ENCOUNTER — Other Ambulatory Visit: Payer: Self-pay

## 2021-07-06 VITALS — BP 128/87 | HR 88 | Ht 64.0 in | Wt 247.8 lb

## 2021-07-06 DIAGNOSIS — Z01419 Encounter for gynecological examination (general) (routine) without abnormal findings: Secondary | ICD-10-CM | POA: Diagnosis not present

## 2021-07-06 DIAGNOSIS — Z9079 Acquired absence of other genital organ(s): Secondary | ICD-10-CM

## 2021-07-06 DIAGNOSIS — Z803 Family history of malignant neoplasm of breast: Secondary | ICD-10-CM

## 2021-07-06 DIAGNOSIS — N912 Amenorrhea, unspecified: Secondary | ICD-10-CM | POA: Diagnosis not present

## 2021-07-06 DIAGNOSIS — Z124 Encounter for screening for malignant neoplasm of cervix: Secondary | ICD-10-CM

## 2021-07-06 DIAGNOSIS — Z90721 Acquired absence of ovaries, unilateral: Secondary | ICD-10-CM

## 2021-07-06 NOTE — Progress Notes (Signed)
35 y.o. G40P2002 Married White or Caucasian female here for annual exam.  Doing well.  Has had a vaginal delivery since I saw her last.  Daughter is 16 months.  Still breast feeding about 50/50.  Hasn't had a cycle yet.  H/O PCOS.  Has h/o amenorrhea.  Doesn't want to be on any hormonal therapy.  Has Mirena IUD in the past with significant irregular bleeding.    Has new PCP.  HbA1C was 5.7.  Is being watched for diabetes.  Sexually active: Yes.    The current method of family planning is vasectomy.    Smoker:  no  Health Maintenance: Pap:  12/19/2018 [facility] MMG:  oncologist recommended 35.  Will plan after breast feeding is completed for 6 months. Colonoscopy:  06/07/2010 Screening Labs: done with new PCP   reports that she has never smoked. She has never used smokeless tobacco. She reports that she does not currently use alcohol. She reports that she does not use drugs.  Past Medical History:  Diagnosis Date   Anxiety    Depression    Family history of breast cancer    Family history of prostate cancer    Gestational diabetes 09/16/2019   Infertility, female    clomid   Migraines    Ovarian cyst    dermoid    PCOS (polycystic ovarian syndrome)    Stomach problems    (migraines or spasms) since age 52     Past Surgical History:  Procedure Laterality Date   COLONOSCOPY  2011   GALLBLADDER SURGERY     IUD REMOVAL  01/14/2019   LAPAROSCOPIC OVARIAN CYSTECTOMY Right 12/24/2013   Procedure: LAPAROSCOPIC OVARIAN CYSTECTOMY;  Surgeon: Azalia Bilis, MD;  Location: [facility];  Service: Gynecology;  Laterality: Right;   ROBOTIC ASSISTED LAPAROSCOPIC OVARIAN CYSTECTOMY Left 01/23/2019   Procedure: XI ROBOTIC ASSISTED LAPAROSCOPIC LEFT  OVARIAN CYSTECTOMY;  Surgeon: Everitt Amber, MD;  Location: [facility];  Service: Gynecology;  Laterality: Left;   ROBOTIC ASSISTED SALPINGO OOPHERECTOMY Right 01/23/2019   Procedure: XI ROBOTIC ASSISTED RIGHT SALPINGO OOPHORECTOMY;  Surgeon: Everitt Amber, MD;   Location: [facility];  Service: Gynecology;  Laterality: Right;   UPPER GI ENDOSCOPY  2011   WISDOM TOOTH EXTRACTION      Current Outpatient Medications  Medication Sig Dispense Refill   Prenatal Vit-Fe Fumarate-FA (PRENATAL MULTIVITAMIN) TABS tablet Take 1 tablet by mouth daily at 12 noon.     No current facility-administered medications for this visit.    Family History  Problem Relation Age of Onset   Thyroid disease Mother    Hyperlipidemia Father    Cancer Father        prostate cancer   Breast cancer Maternal Grandmother 39       d. 23   Cancer Maternal Grandmother        bone cancer   Diabetes Maternal Grandfather    Breast cancer Other        MGMs sister   Breast cancer Other        MGMs sister   Cancer Other        MGMs brother with bone cancer   Anesthesia problems Neg Hx    Hypotension Neg Hx    Malignant hyperthermia Neg Hx    Pseudochol deficiency Neg Hx     Review of Systems  All other systems reviewed and are negative.  Exam:   BP 128/87 (BP Location: Right Arm, Patient Position: Sitting, Cuff Size: Large)   Pulse  88   Ht 5\' 4"  (1.626 m) Comment: reported  Wt 247 lb 12.8 oz (112.4 kg)   BMI 42.53 kg/m   Height: 5\' 4"  (162.6 cm) (reported)  General appearance: alert, cooperative and appears stated age Head: Normocephalic, without obvious abnormality, atraumatic Neck: no adenopathy, supple, symmetrical, trachea midline and thyroid normal to inspection and palpation Lungs: clear to auscultation bilaterally Breasts: normal appearance, no masses or tenderness Heart: regular rate and rhythm Abdomen: soft, non-tender; bowel sounds normal; no masses,  no organomegaly Extremities: extremities normal, atraumatic, no cyanosis or edema Skin: Skin color, texture, turgor normal. No rashes or lesions Lymph nodes: Cervical, supraclavicular, and axillary nodes normal. No abnormal inguinal nodes palpated Neurologic: Grossly normal   Pelvic: External genitalia:  no  lesions              Urethra:  normal appearing urethra with no masses, tenderness or lesions              Bartholins and Skenes: normal                 Vagina: normal appearing vagina with normal color and no discharge, no lesions              Cervix: no lesions              Pap taken: Yes.   Bimanual Exam:  Uterus:  normal size, contour, position, consistency, mobility, non-tender              Adnexa: normal adnexa and no mass, fullness, tenderness               Rectovaginal: Confirms               Anus:  normal sphincter tone, no lesions  Chaperone, Octaviano Batty, CMA, was present for exam.  Assessment/Plan: 1. Well woman exam with routine gynecological exam - pap smear with HR HPV obtained today - Will plan to start MMG after pt has completed breast feeding for 6 months - colon cancer screening guidelines reviewed - lab work don with PCP - care gaps updated/vaccines updated  2. Amenorrhea - pt declines any hormonal therapy at this time. - will have her return for ultrasound to assess endometrial thickness - US PELVIS TRANSVAGINAL NON-OB (TV ONLY); Future  3. Family history of breast cancer   4. 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  PCOS  6.  H/o gestational diabetes

## 2021-07-07 LAB — CYTOLOGY - PAP
Comment: NEGATIVE
Diagnosis: NEGATIVE
High risk HPV: NEGATIVE

## 2021-08-17 ENCOUNTER — Encounter (HOSPITAL_BASED_OUTPATIENT_CLINIC_OR_DEPARTMENT_OTHER): Payer: Self-pay | Admitting: Obstetrics & Gynecology

## 2021-08-17 ENCOUNTER — Other Ambulatory Visit (HOSPITAL_BASED_OUTPATIENT_CLINIC_OR_DEPARTMENT_OTHER): Payer: Self-pay | Admitting: Obstetrics & Gynecology

## 2021-08-17 ENCOUNTER — Other Ambulatory Visit: Payer: Self-pay

## 2021-08-17 ENCOUNTER — Ambulatory Visit (INDEPENDENT_AMBULATORY_CARE_PROVIDER_SITE_OTHER): Payer: Medicaid Other | Admitting: Obstetrics & Gynecology

## 2021-08-17 ENCOUNTER — Ambulatory Visit (INDEPENDENT_AMBULATORY_CARE_PROVIDER_SITE_OTHER): Payer: Medicaid Other

## 2021-08-17 VITALS — BP 122/96 | HR 88 | Ht 64.0 in | Wt 247.8 lb

## 2021-08-17 DIAGNOSIS — Z9079 Acquired absence of other genital organ(s): Secondary | ICD-10-CM | POA: Diagnosis not present

## 2021-08-17 DIAGNOSIS — N912 Amenorrhea, unspecified: Secondary | ICD-10-CM

## 2021-08-17 DIAGNOSIS — D3A098 Benign carcinoid tumors of other sites: Secondary | ICD-10-CM

## 2021-08-17 DIAGNOSIS — Z90721 Acquired absence of ovaries, unilateral: Secondary | ICD-10-CM | POA: Diagnosis not present

## 2021-08-20 NOTE — Progress Notes (Signed)
GYNECOLOGY  VISIT  CC:   review ultrasound/amenorrhea  HPI: 36 y.o. G50P3002 Married White or Caucasian female here for review of ultrasound and discuss recommendations.  Pt with hx of PCOS and amenorrhea.  She is still breast feeding from last pregnancy.  She is hopeful to try and breastfeed for two full years.  Due to h/o abnormal/thickened endometrium, ultrasound recommended as I am not sure if amenorrhea for this pt is due to breast feeding or PCOS.  Denies vaginal bleeding or discharge.  Ultrasound performed today showed thin endometrium.  I do not feel biopsy is warranted today.  Images reviewed with pt.  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   Migraines    Depression     Past Medical History:  Diagnosis Date   Anxiety    Depression    Family history of breast cancer    Family history of prostate cancer    Gestational diabetes 09/16/2019   Infertility, female    clomid   Migraines    Ovarian cyst    dermoid    PCOS (polycystic ovarian syndrome)    Stomach problems    (migraines or spasms) since age 68     Past Surgical History:  Procedure Laterality Date   COLONOSCOPY  2011   GALLBLADDER SURGERY     IUD REMOVAL  01/14/2019   LAPAROSCOPIC OVARIAN CYSTECTOMY Right 12/24/2013   Procedure: LAPAROSCOPIC OVARIAN CYSTECTOMY;  Surgeon: Azalia Bilis, MD;  Location: [facility];  Service: Gynecology;  Laterality: Right;   ROBOTIC ASSISTED LAPAROSCOPIC OVARIAN CYSTECTOMY Left 01/23/2019   Procedure: XI ROBOTIC ASSISTED LAPAROSCOPIC LEFT  OVARIAN CYSTECTOMY;  Surgeon: Everitt Amber, MD;  Location: [facility];  Service:  Gynecology;  Laterality: Left;   ROBOTIC ASSISTED SALPINGO OOPHERECTOMY Right 01/23/2019   Procedure: XI ROBOTIC ASSISTED RIGHT SALPINGO OOPHORECTOMY;  Surgeon: Everitt Amber, MD;  Location: [facility];  Service: Gynecology;  Laterality: Right;   UPPER GI ENDOSCOPY  2011   WISDOM TOOTH EXTRACTION      MEDS:   Current Outpatient Medications on File Prior to Visit  Medication Sig Dispense Refill   Prenatal Vit-Fe Fumarate-FA (PRENATAL MULTIVITAMIN) TABS tablet Take 1 tablet by mouth daily at 12 noon.     No current facility-administered medications on file prior to visit.    ALLERGIES: Augmentin [amoxicillin-pot clavulanate] and Penicillins  Family History  Problem Relation Age of Onset   Thyroid disease Mother    Hyperlipidemia Father    Cancer Father        prostate cancer   Breast cancer Maternal Grandmother 34       d. 31   Cancer Maternal Grandmother        bone cancer   Diabetes Maternal Grandfather    Breast cancer Other        MGMs sister   Breast cancer Other        MGMs sister   Cancer Other        MGMs brother with bone cancer   Anesthesia problems Neg Hx    Hypotension Neg Hx    Malignant hyperthermia Neg Hx    Pseudochol  deficiency Neg Hx     SH:  married, non smoker  Review of Systems  All other systems reviewed and are negative.  PHYSICAL EXAMINATION:    BP (!) 122/96 (BP Location: Right Arm, Patient Position: Sitting, Cuff Size: Large)    Pulse 88    Ht 5\' 4"  (1.626 m)    Wt 247 lb 12.8 oz (112.4 kg)    BMI 42.53 kg/m     Physical Exam Constitutional:      Appearance: Normal appearance.  Neurological:     General: No focal deficit present.     Mental Status: She is alert.  Psychiatric:        Mood and Affect: Mood normal.    Assessment/Plan: 1. Amenorrhea - pt is going to monitor bleeding when starts to wean.  If no cycle, will call for provera challenged 10mg  x 10 days.  Pt is aware to have a cycle at least every 90 days.  Can call for provera at  any time as long as can confirm negative pregnancy.  Declines needs for rx today.  2. History of right salpingo-oophorectomy  3. Carcinoid tumor of ovary s/p removal

## 2021-11-29 IMAGING — US US MFM FETAL BPP W/O NON-STRESS
1 series · 12 of 23 positions shown · non-contrast
Comparison: none

[Series 1: us mfm fetal bpp w/o non-stress · 23 acquisitions, 12 frames shown]
[im 1/23]
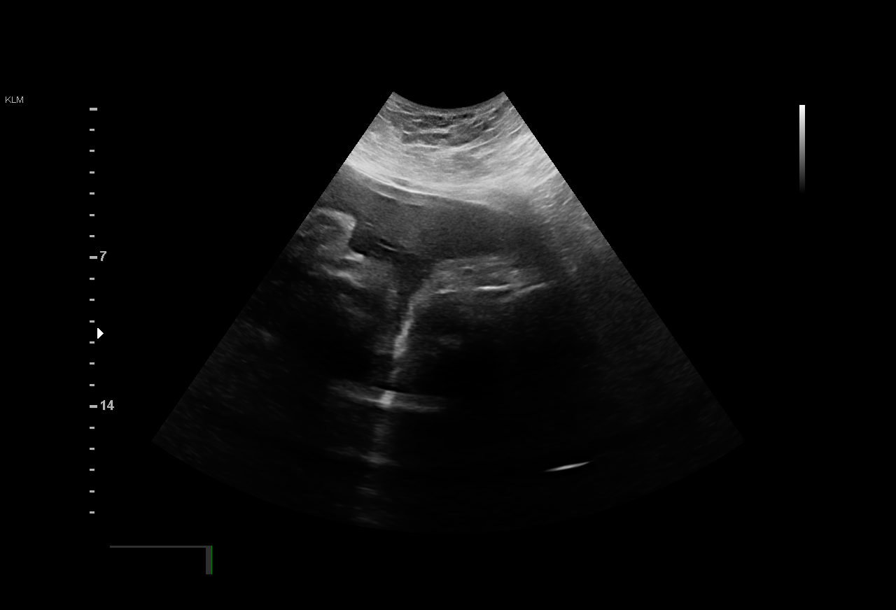
[im 3/23]
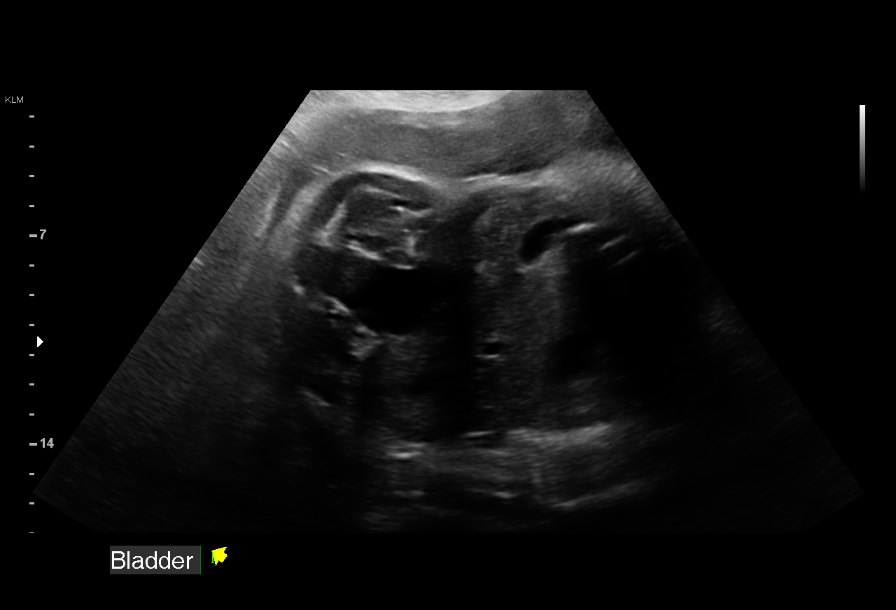
[im 5/23]
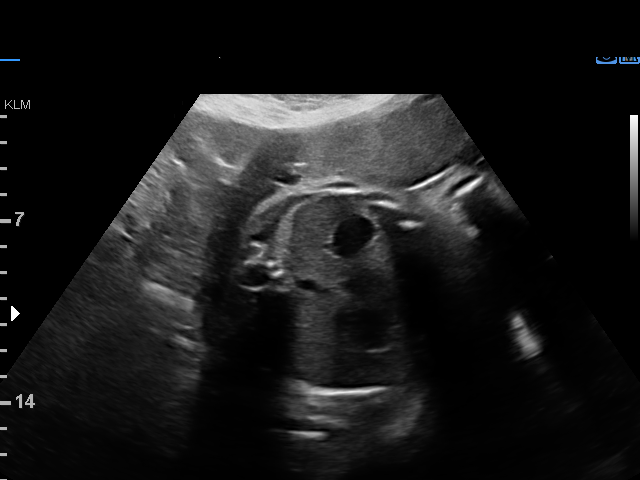
[im 7/23]
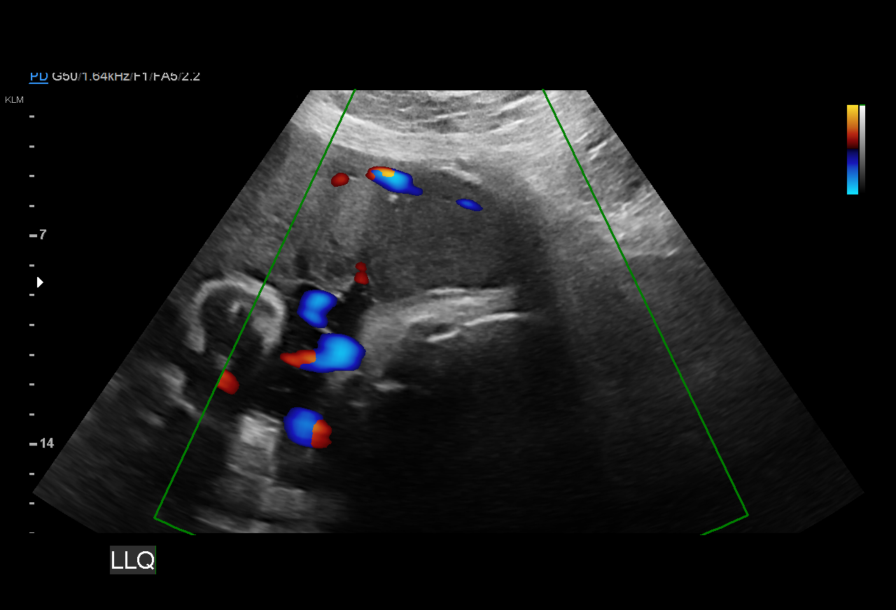
[im 9/23]
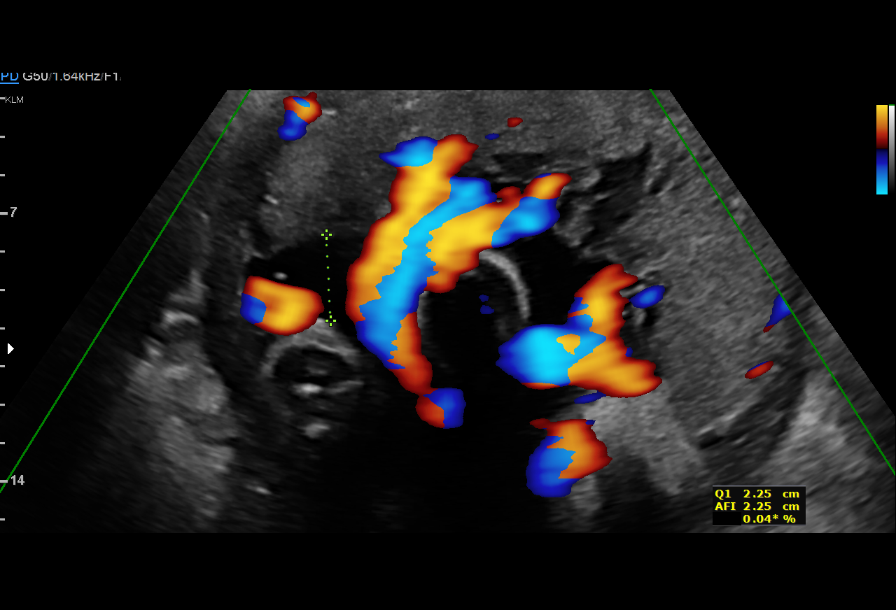
[im 11/23]
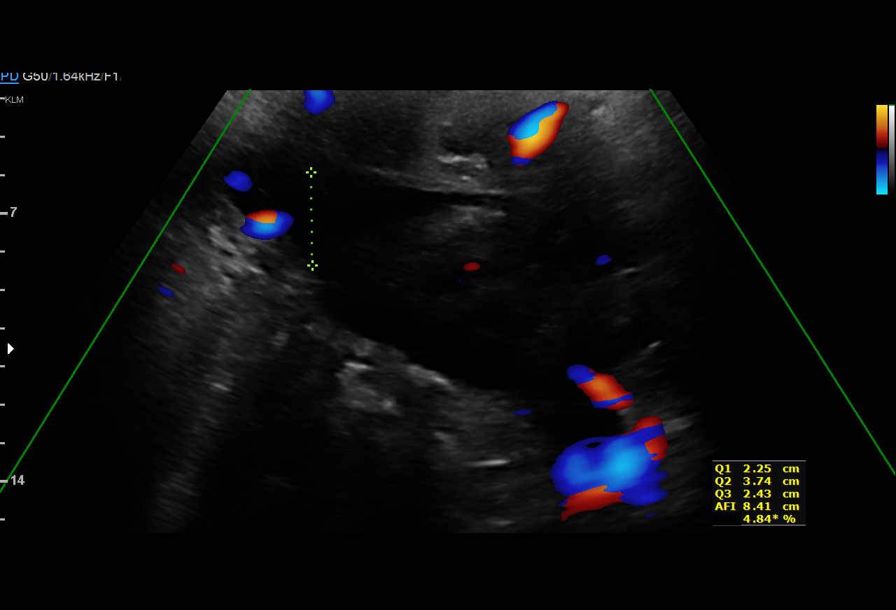
[im 13/23]
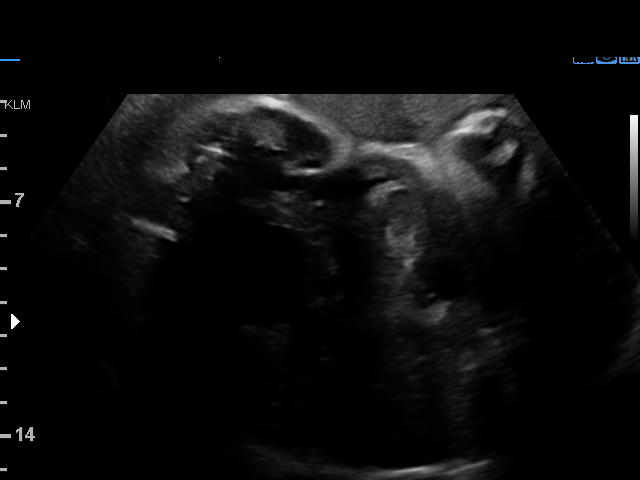
[im 15/23]
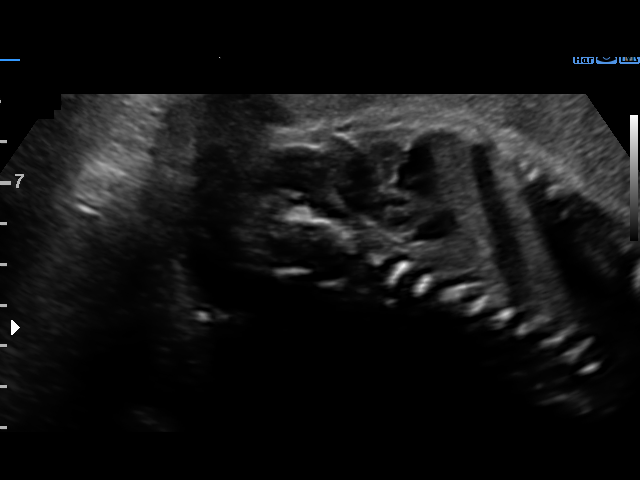
[im 17/23]
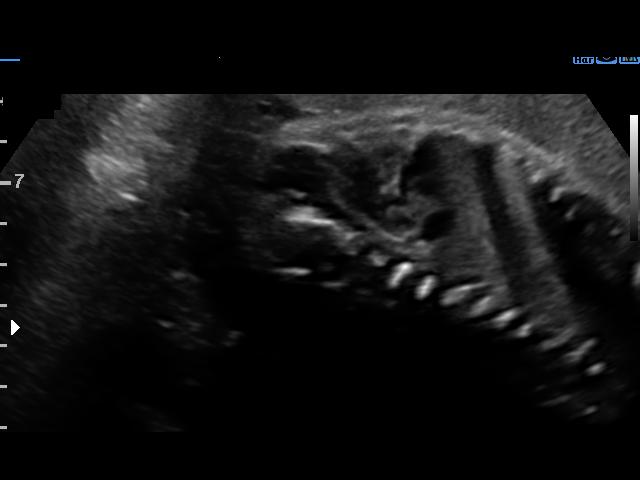
[im 19/23]
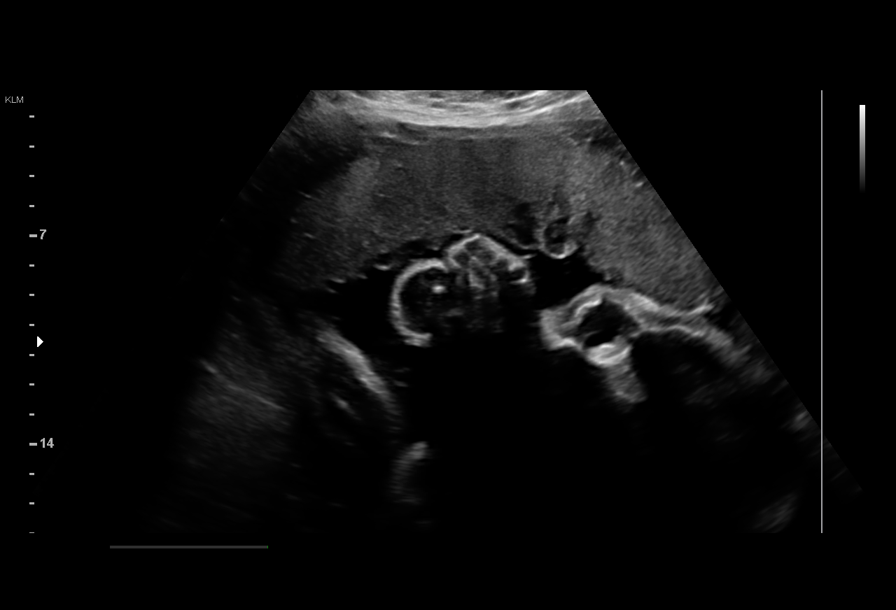
[im 21/23]
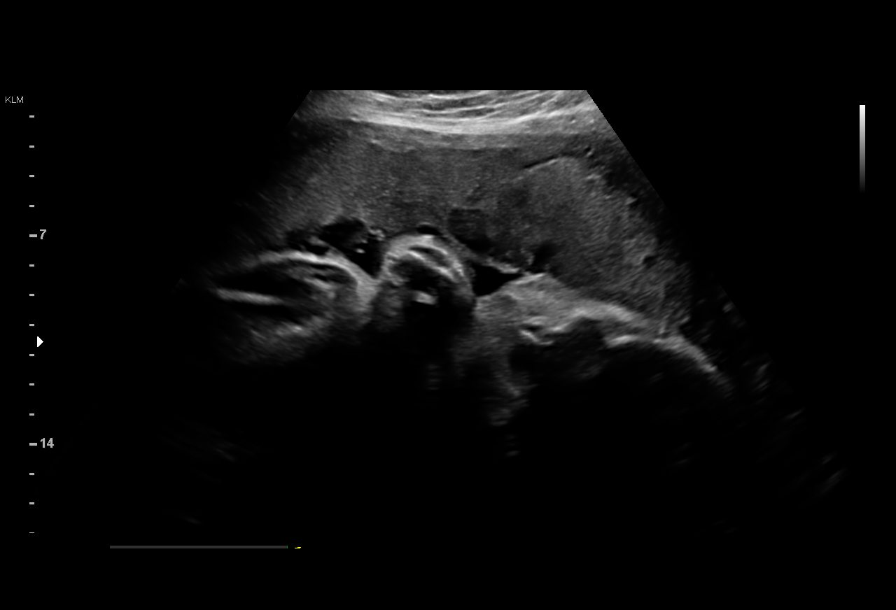
[im 23/23]
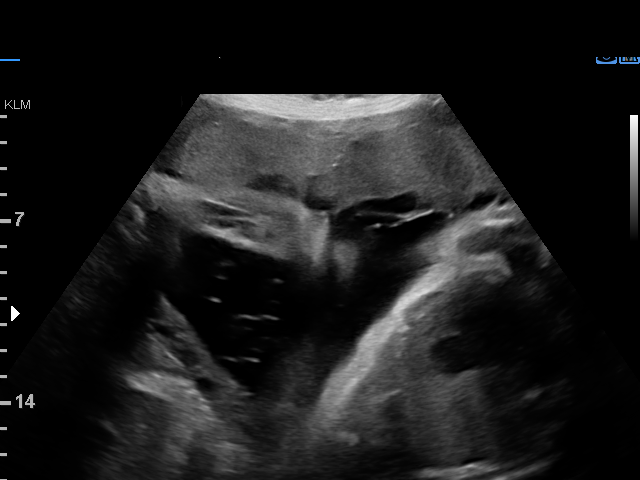

[12 of 23 positions shown; findings below may reference images not displayed]

Indications

 Gestational diabetes in pregnancy,
 controlled by oral hypoglycemic drugs
 32 weeks gestation of pregnancy
 Maternal morbid obesity
Fetal Evaluation

 Num Of Fetuses:         1
 Fetal Heart Rate(bpm):  157
 Cardiac Activity:       Observed
 Presentation:           Cephalic
 Placenta:               Anterior

 Amniotic Fluid
 AFI FV:      Within normal limits

 AFI Sum(cm)     %Tile       Largest Pocket(cm)
 10.6            21

 RUQ(cm)       RLQ(cm)       LUQ(cm)        LLQ(cm)

Biophysical Evaluation

 Amniotic F.V:   Within normal limits       F. Tone:        Observed
 F. Movement:    Observed                   Score:          [DATE]
 F. Breathing:   Observed
OB History

 Blood Type:   A+
 Gravidity:    3         Term:   2        Prem:   0        SAB:   0
 TOP:          0       Ectopic:  0        Living: 2
Gestational Age

 LMP:           33w 2d        Date:  05/24/19                 EDD:   02/28/20
 Best:          32w 3d     Det. By:  [facility] Ul[facility]ound         EDD:   03/05/20
                                     (07/15/19)
Anatomy

 Stomach:               Appears normal, left   Bladder:                Appears normal
                        sided
Comments

 This patient was seen for a biophysical profile due to
 gestational diabetes currently treated with Metformin.
 A biophysical profile performed today was [DATE].
 There was normal amniotic fluid noted on today's ultrasound
 exam.
 She will return in 1 week for another biophysical profile.

## 2021-12-28 IMAGING — US US MFM FETAL BPP W/O NON-STRESS
1 series · 15 of 28 positions shown · non-contrast
Comparison: none

[Series 1: us mfm fetal bpp w/o non-stress · 32 acquisitions, 15 frames shown]
[im 1/32]
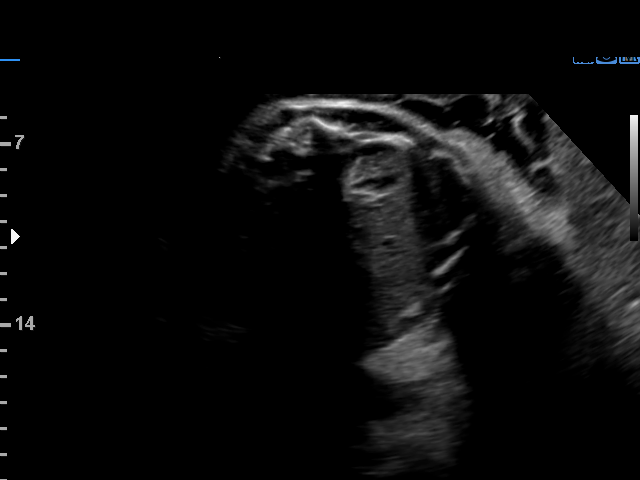
[im 3/32]
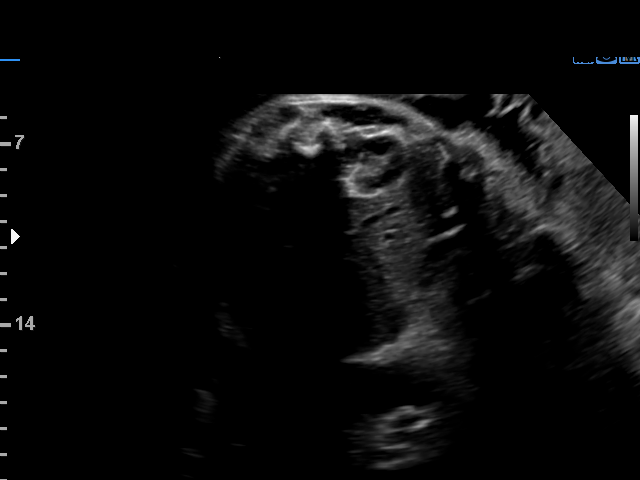
[im 5/32]
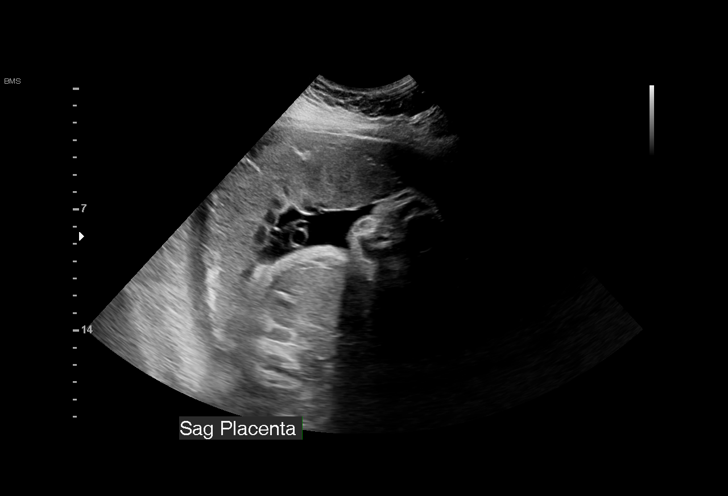
[im 7/32]
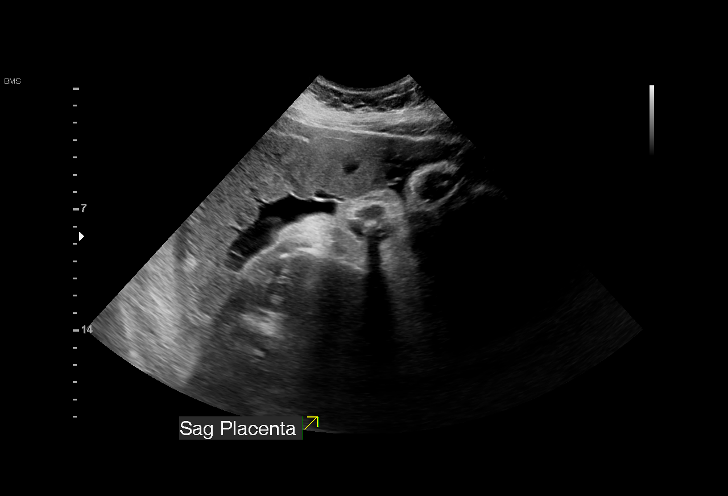
[im 10/32]
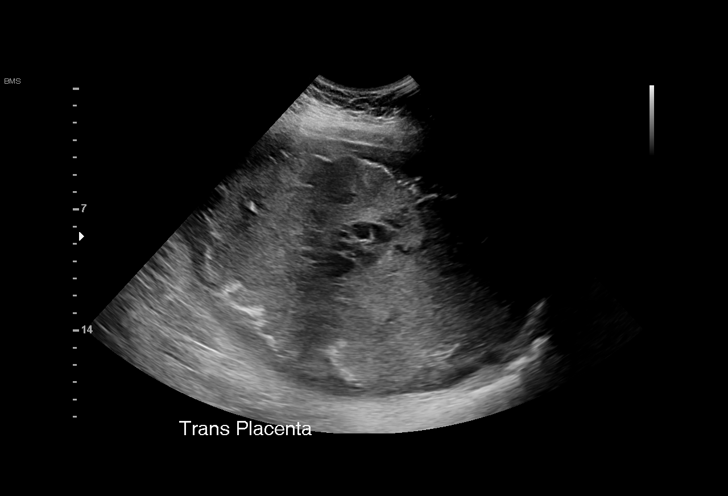
[im 12/32]
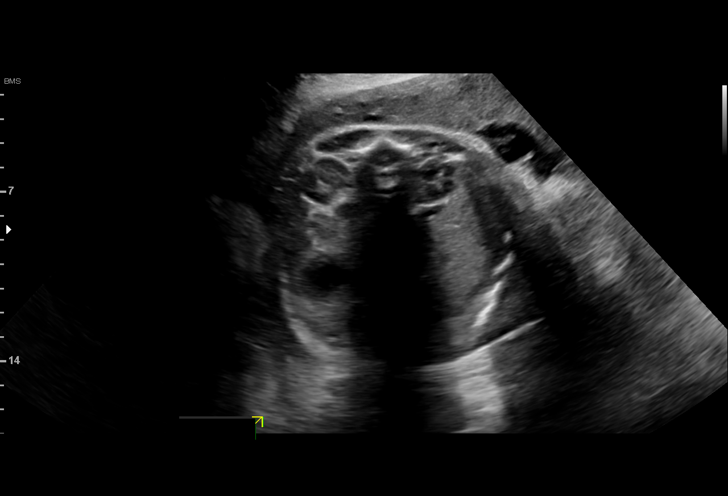
[im 14/32]
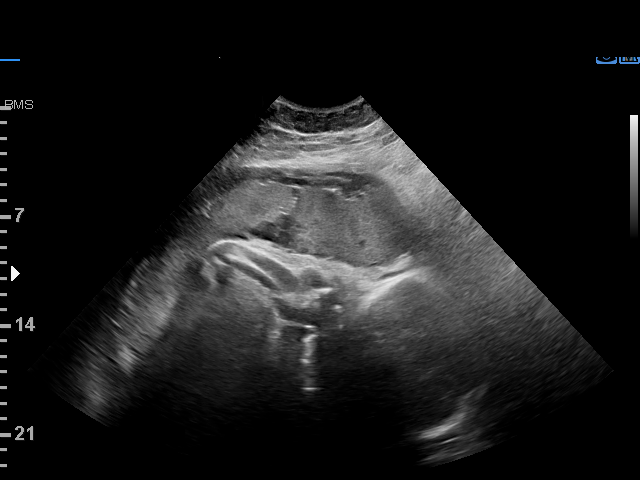
[im 17/32]
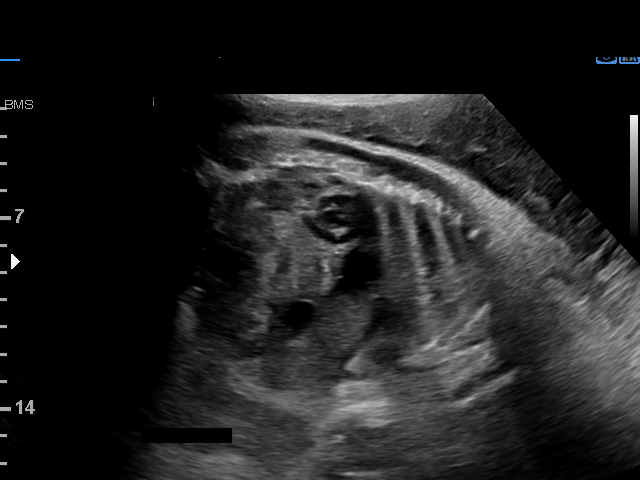
[im 18/32]
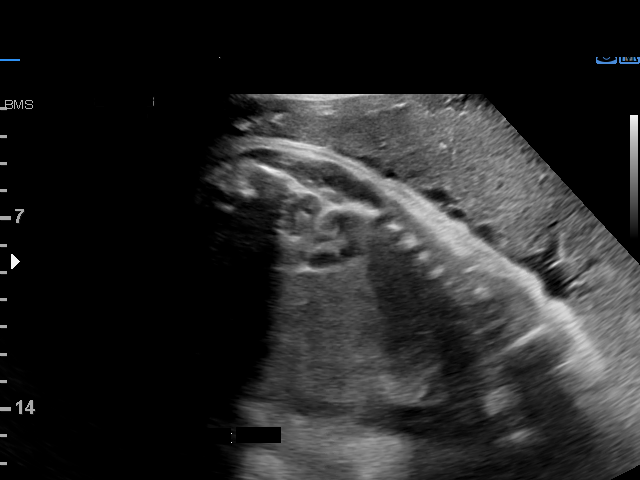
[im 20/32]
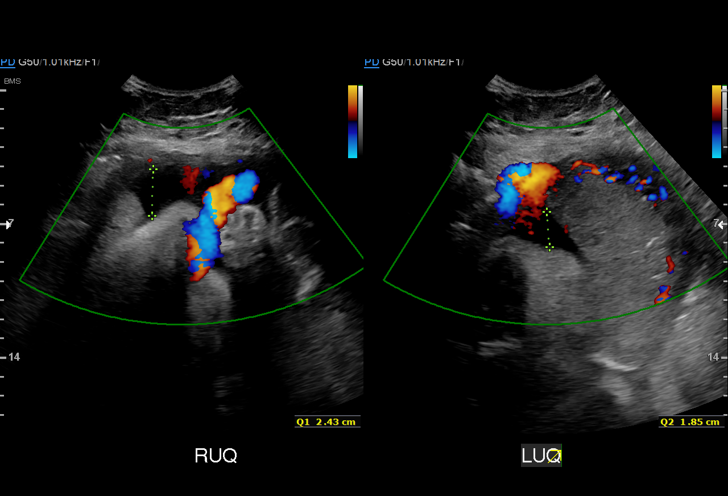
[im 22/32]
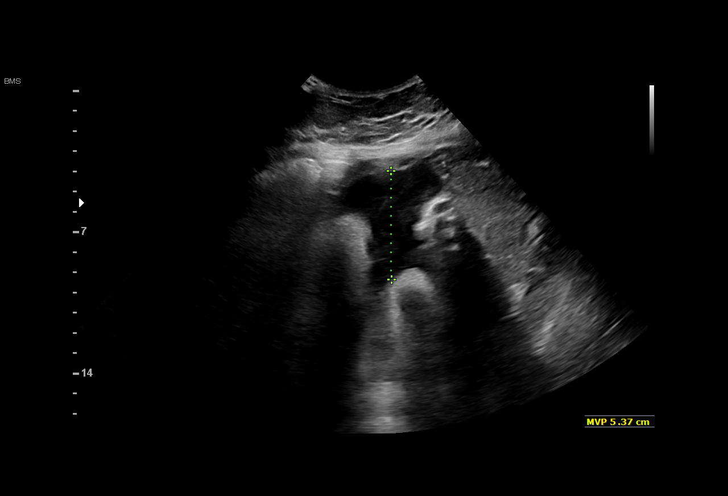
[im 25/32]
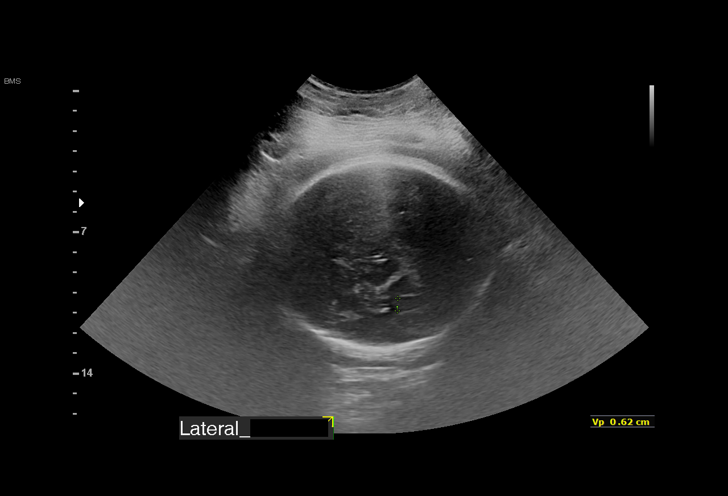
[im 27/32]
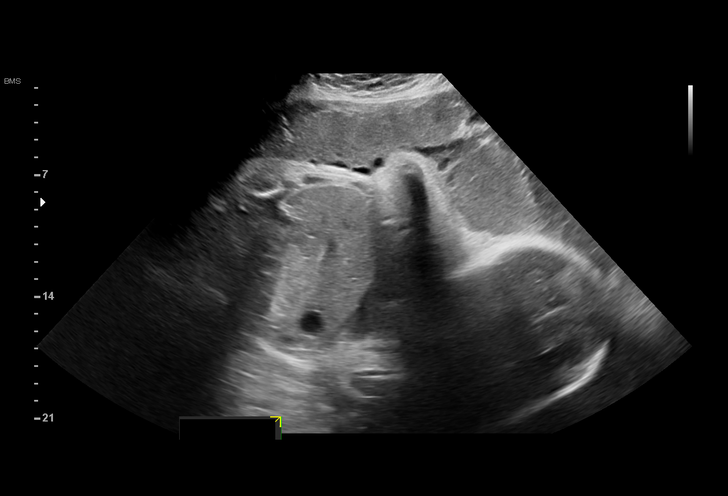
[im 29/32]
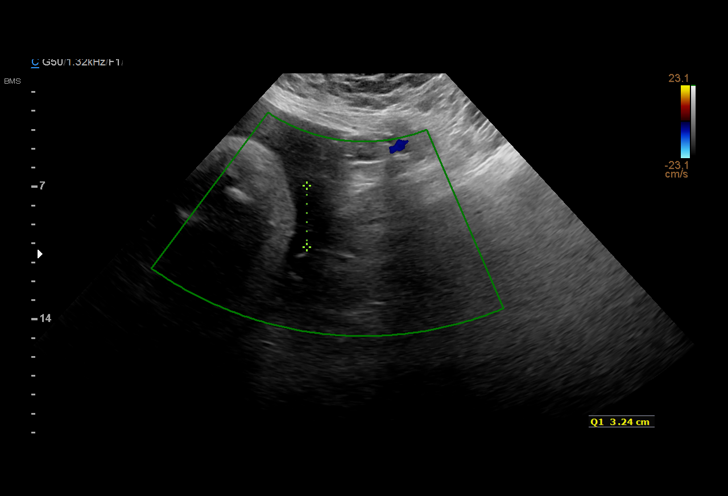
[im 32/32]
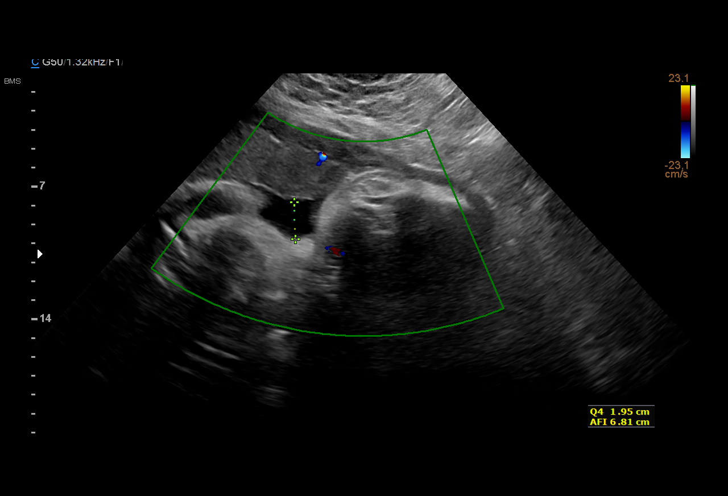

[15 of 28 positions shown; findings below may reference images not displayed]

Indications

 36 weeks gestation of pregnancy
 Gestational diabetes in pregnancy,
 controlled by oral hypoglycemic drugs
 Maternal morbid obesity
 Encounter for other antenatal screening
 follow-up
Fetal Evaluation

 Num Of Fetuses:         1
 Fetal Heart Rate(bpm):  143
 Cardiac Activity:       Observed
 Presentation:           Cephalic
 Placenta:               Anterior
 P. Cord Insertion:      Previously Visualized

 Amniotic Fluid
 AFI FV:      Subjectively low-normal

 AFI Sum(cm)     %Tile       Largest Pocket(cm)
 5.8             < 3

 RUQ(cm)       RLQ(cm)       LUQ(cm)        LLQ(cm)
 2.4           0
Biometry

 LV:        6.2  mm
OB History

 Blood Type:   A+
 Gravidity:    3         Term:   2        Prem:   0        SAB:   0
 TOP:          0       Ectopic:  0        Living: 2
Gestational Age

 LMP:           37w 3d        Date:  05/24/19                 EDD:   02/28/20
 Best:          36w 4d     Det. By:  Early Ultrasound         EDD:   03/05/20
                                     (07/15/19)
Anatomy

 Ventricles:            Appears normal         Kidneys:                Appear normal
 Diaphragm:             Appears normal         Bladder:                Appears normal
 Stomach:               Appears normal, left
                        sided
Cervix Uterus Adnexa

 Cervix
 Normal appearance by transabdominal scan.
Impression

 Antenatal testing performed secondary to LVK3Z and
 elevated BMI
 Biophysical profile [DATE] with subjectively low fluid (but low
 normal values) and good fetal movement.

 Ms. Vasilenvna reports good daily fetal movement. I discussed
 today's visit and reiterated daily fetal Ismena Bosek. I also
 discussed possible delivery between 37-39 week based on
 fetal status and maternal blood glucose control.
Recommendations

 Delivery between 37-39 week based on fetal status and
 maternal blood glucose control.
 She is scheduled to return in 1 week.

## 2022-03-15 ENCOUNTER — Encounter (INDEPENDENT_AMBULATORY_CARE_PROVIDER_SITE_OTHER): Payer: Self-pay

## 2022-07-10 ENCOUNTER — Ambulatory Visit (HOSPITAL_BASED_OUTPATIENT_CLINIC_OR_DEPARTMENT_OTHER): Payer: Medicaid Other | Admitting: Obstetrics & Gynecology

## 2022-07-11 ENCOUNTER — Encounter (HOSPITAL_BASED_OUTPATIENT_CLINIC_OR_DEPARTMENT_OTHER): Payer: Self-pay | Admitting: Obstetrics & Gynecology

## 2022-07-11 ENCOUNTER — Ambulatory Visit (INDEPENDENT_AMBULATORY_CARE_PROVIDER_SITE_OTHER): Payer: Medicaid Other | Admitting: Obstetrics & Gynecology

## 2022-07-11 VITALS — BP 117/83 | HR 73 | Ht 64.0 in | Wt 232.0 lb

## 2022-07-11 DIAGNOSIS — Z01419 Encounter for gynecological examination (general) (routine) without abnormal findings: Secondary | ICD-10-CM | POA: Diagnosis not present

## 2022-07-11 DIAGNOSIS — D3A098 Benign carcinoid tumors of other sites: Secondary | ICD-10-CM | POA: Diagnosis not present

## 2022-07-11 DIAGNOSIS — Z803 Family history of malignant neoplasm of breast: Secondary | ICD-10-CM

## 2022-07-11 DIAGNOSIS — Z9079 Acquired absence of other genital organ(s): Secondary | ICD-10-CM

## 2022-07-11 DIAGNOSIS — Z90721 Acquired absence of ovaries, unilateral: Secondary | ICD-10-CM

## 2022-07-11 DIAGNOSIS — Z8632 Personal history of gestational diabetes: Secondary | ICD-10-CM | POA: Diagnosis not present

## 2022-07-11 DIAGNOSIS — N912 Amenorrhea, unspecified: Secondary | ICD-10-CM | POA: Diagnosis not present

## 2022-07-11 DIAGNOSIS — N979 Female infertility, unspecified: Secondary | ICD-10-CM

## 2022-07-11 LAB — POCT URINE PREGNANCY: Preg Test, Ur: NEGATIVE

## 2022-07-11 NOTE — Addendum Note (Signed)
Addended by: Octaviano Batty B on: 07/11/2022 10:15 AM   Modules accepted: Orders

## 2022-07-11 NOTE — Progress Notes (Signed)
36 y.o. G44P3002 Married White or Caucasian female here for annual exam.  H/o PCOS with irregular cycles.  Is having irregular cycles.  Had a cycle in March, May and then more regular through the year.  She hasn't had a cycle in November.  She has recently had Covid.  Husband had a vasectomy.          Sexually active: Yes.    The current method of family planning is vasectomy.    Smoker:  no  Health Maintenance: Pap:  07/06/2021 Negative History of abnormal Pap:  ASCUS with neg HR HPV MMG:  we discussed starting at age 5.  She is still breast feeding.   Colonoscopy:  06/07/2010 Screening Labs: HbA1c, lipids will be obtained today   reports that she has never smoked. She has never used smokeless tobacco. She reports that she does not currently use alcohol. She reports that she does not use drugs.  Past Medical History:  Diagnosis Date   Anxiety    Depression    Family history of breast cancer    Family history of prostate cancer    Gestational diabetes 09/16/2019   Infertility, female    clomid   Migraines    Ovarian cyst    dermoid    PCOS (polycystic ovarian syndrome)    Stomach problems    (migraines or spasms) since age 18     Past Surgical History:  Procedure Laterality Date   COLONOSCOPY  2011   GALLBLADDER SURGERY     IUD REMOVAL  01/14/2019   LAPAROSCOPIC OVARIAN CYSTECTOMY Right 12/24/2013   Procedure: LAPAROSCOPIC OVARIAN CYSTECTOMY;  Surgeon: Azalia Bilis, MD;  Location: [facility];  Service: Gynecology;  Laterality: Right;   ROBOTIC ASSISTED LAPAROSCOPIC OVARIAN CYSTECTOMY Left 01/23/2019   Procedure: XI ROBOTIC ASSISTED LAPAROSCOPIC LEFT  OVARIAN CYSTECTOMY;  Surgeon: Everitt Amber, MD;  Location: [facility];  Service: Gynecology;  Laterality: Left;   ROBOTIC ASSISTED SALPINGO OOPHERECTOMY Right 01/23/2019   Procedure: XI ROBOTIC ASSISTED RIGHT SALPINGO OOPHORECTOMY;  Surgeon: Everitt Amber, MD;  Location: [facility];  Service: Gynecology;  Laterality: Right;   UPPER GI ENDOSCOPY   2011   WISDOM TOOTH EXTRACTION      Current Outpatient Medications  Medication Sig Dispense Refill   FLUoxetine (PROZAC) 20 MG capsule Take 20 mg by mouth daily.     No current facility-administered medications for this visit.    Family History  Problem Relation Age of Onset   Thyroid disease Mother    Hyperlipidemia Father    Cancer Father        prostate cancer   Breast cancer Maternal Grandmother 74       d. 12   Cancer Maternal Grandmother        bone cancer   Diabetes Maternal Grandfather    Breast cancer Other        MGMs sister   Breast cancer Other        MGMs sister   Cancer Other        MGMs brother with bone cancer   Anesthesia problems Neg Hx    Hypotension Neg Hx    Malignant hyperthermia Neg Hx    Pseudochol deficiency Neg Hx     ROS: Constitutional: negative Genitourinary:negative  Exam:   BP 117/83 (BP Location: Right Arm, Patient Position: Sitting, Cuff Size: Large)   Pulse 73   Ht '5\' 4"'$  (1.626 m) Comment: reported  Wt 232 lb (105.2 kg)   LMP 05/23/2022  BMI 39.82 kg/m   Height: '5\' 4"'$  (162.6 cm) (reported)  General appearance: alert, cooperative and appears stated age Head: Normocephalic, without obvious abnormality, atraumatic Neck: no adenopathy, supple, symmetrical, trachea midline and thyroid normal to inspection and palpation Lungs: clear to auscultation bilaterally Breasts: normal appearance, no masses or tenderness Heart: regular rate and rhythm Abdomen: soft, non-tender; bowel sounds normal; no masses,  no organomegaly Extremities: extremities normal, atraumatic, no cyanosis or edema Skin: Skin color, texture, turgor normal. No rashes or lesions Lymph nodes: Cervical, supraclavicular, and axillary nodes normal. No abnormal inguinal nodes palpated Neurologic: Grossly normal   Pelvic: External genitalia:  no lesions              Urethra:  normal appearing urethra with no masses, tenderness or lesions              Bartholins and  Skenes: normal                 Vagina: normal appearing vagina with normal color and no discharge, no lesions              Cervix: no lesions              Pap taken: No. Bimanual Exam:  Uterus:  normal size, contour, position, consistency, mobility, non-tender              Adnexa: normal adnexa and no mass, fullness, tenderness               Rectovaginal: Confirms               Anus:  normal sphincter tone, no lesions  Chaperone, Octaviano Batty, CMA, was present for exam.  Assessment/Plan: 1. Well woman exam with routine gynecological exam - Pap smear neg with neg HR HPV 2022.  Not indicated today. - Mammogram due.  Will check with radiology about when it can be done due to breastfeeding. - Colonoscopy 2011.  Guidelines reviewed. - lab work ordered today - vaccines reviewed/updated  2. History of gestational diabetes - Hemoglobin A1c - Lipid panel  3. Carcinoid tumor of ovary, h/o  4. History of right salpingo-oophorectomy  5. PCOS - pt is following menstrual cycles and will use provera if no cycle after 90 days  6. Family history of breast cancer - we have planned to start at age 79 but pt is still breast feeding

## 2022-07-12 LAB — LIPID PANEL
Chol/HDL Ratio: 4.8 ratio — ABNORMAL HIGH (ref 0.0–4.4)
Cholesterol, Total: 227 mg/dL — ABNORMAL HIGH (ref 100–199)
HDL: 47 mg/dL (ref >39)
LDL Chol Calc (NIH): 160 mg/dL — ABNORMAL HIGH (ref 0–99)
Triglycerides: 112 mg/dL (ref 0–149)
VLDL Cholesterol Cal: 20 mg/dL (ref 5–40)

## 2022-07-12 LAB — HEMOGLOBIN A1C
Est. average glucose Bld gHb Est-mCnc: 117 mg/dL
Hgb A1c MFr Bld: 5.7 % — ABNORMAL HIGH (ref 4.8–5.6)

## 2022-11-15 ENCOUNTER — Encounter (HOSPITAL_BASED_OUTPATIENT_CLINIC_OR_DEPARTMENT_OTHER): Payer: Self-pay | Admitting: Obstetrics & Gynecology

## 2022-11-16 ENCOUNTER — Ambulatory Visit (HOSPITAL_BASED_OUTPATIENT_CLINIC_OR_DEPARTMENT_OTHER)
Admission: RE | Admit: 2022-11-16 | Discharge: 2022-11-16 | Disposition: A | Discharge status: Home / Self Care | Payer: Medicaid Other | Source: Ambulatory Visit | Attending: Obstetrics & Gynecology | Admitting: Obstetrics & Gynecology

## 2022-11-16 ENCOUNTER — Ambulatory Visit (INDEPENDENT_AMBULATORY_CARE_PROVIDER_SITE_OTHER): Payer: Medicaid Other | Admitting: Obstetrics & Gynecology

## 2022-11-16 ENCOUNTER — Encounter (HOSPITAL_BASED_OUTPATIENT_CLINIC_OR_DEPARTMENT_OTHER): Payer: Self-pay | Admitting: Obstetrics & Gynecology

## 2022-11-16 ENCOUNTER — Encounter (HOSPITAL_BASED_OUTPATIENT_CLINIC_OR_DEPARTMENT_OTHER): Payer: Self-pay

## 2022-11-16 VITALS — BP 129/81 | HR 106 | Ht 64.0 in | Wt 245.6 lb

## 2022-11-16 DIAGNOSIS — R234 Changes in skin texture: Secondary | ICD-10-CM | POA: Diagnosis not present

## 2022-11-16 DIAGNOSIS — Z1231 Encounter for screening mammogram for malignant neoplasm of breast: Secondary | ICD-10-CM | POA: Insufficient documentation

## 2022-11-16 DIAGNOSIS — Z803 Family history of malignant neoplasm of breast: Secondary | ICD-10-CM

## 2022-11-16 NOTE — Progress Notes (Signed)
GYNECOLOGY  VISIT  CC:   breast check  HPI: 37 y.o. G3P3002 Married White or Caucasian female here for concerns about possible breast lump.  She stopped breast feeding about two months ago.  She feels her breast now look very different especially the right breast.  There is almost a divet in the right breast where there is flattening.  Pt also is seeing a prominent vein.  She is not sure if this has any meaning or now.  She did not a small lump about two weeks ago that was not tender but has now resolved.     Past Medical History:  Diagnosis Date   Anxiety    Depression    Family history of breast cancer    Family history of prostate cancer    Gestational diabetes 09/16/2019   Infertility, female    clomid   Migraines    Ovarian cyst    dermoid    PCOS (polycystic ovarian syndrome)    Stomach problems    (migraines or spasms) since age 33     MEDS:   Current Outpatient Medications on File Prior to Visit  Medication Sig Dispense Refill   FLUoxetine (PROZAC) 20 MG capsule Take 20 mg by mouth daily.     No current facility-administered medications on file prior to visit.    ALLERGIES: Augmentin [amoxicillin-pot clavulanate] and Penicillins  SH:  married, non smoker  Review of Systems  Constitutional: Negative.     PHYSICAL EXAMINATION:    BP 129/81 (BP Location: Left Arm, Patient Position: Sitting, Cuff Size: Large)   Pulse (!) 106   Ht 5\' 4"  (1.626 m) Comment: Reported  Wt 245 lb 9.6 oz (111.4 kg)   LMP 10/28/2022   BMI 42.16 kg/m      Physical Exam Constitutional:      Appearance: Normal appearance.  Chest:  Breasts:    Right: No swelling, bleeding, inverted nipple, mass, nipple discharge, skin change or tenderness.     Left: No swelling, bleeding, inverted nipple, mass, nipple discharge, skin change or tenderness.  Musculoskeletal:     Cervical back: Normal range of motion.  Lymphadenopathy:     Upper Body:     Right upper body: No supraclavicular or  axillary adenopathy.     Left upper body: No supraclavicular or axillary adenopathy.  Neurological:     Mental Status: She is alert.     Assessment/Plan: 1. Change of skin of breast - normal exam today.  Feel this is likely to finally stopping breast feeding after almost six years of continuous breast feeding.  No concerns on exam.  2. Encounter for screening mammogram for malignant neoplasm of breast - called radiology for pt to go for screening today.  Order placed.  They can add her to the scheduled.  3. Family history of breast cancer    Total time with pt and documentation:  28 minutes.

## 2022-11-16 NOTE — Patient Instructions (Signed)
Call [phone] to schedule an appointment at [facility].    Mammograms can also be self-scheduled online through MyChart.  

## 2022-11-17 DIAGNOSIS — D271 Benign neoplasm of left ovary: Secondary | ICD-10-CM | POA: Insufficient documentation

## 2022-11-19 ENCOUNTER — Encounter (HOSPITAL_BASED_OUTPATIENT_CLINIC_OR_DEPARTMENT_OTHER): Payer: Self-pay | Admitting: Obstetrics & Gynecology

## 2022-11-27 ENCOUNTER — Other Ambulatory Visit: Payer: Self-pay

## 2022-11-27 ENCOUNTER — Other Ambulatory Visit (HOSPITAL_BASED_OUTPATIENT_CLINIC_OR_DEPARTMENT_OTHER): Payer: Self-pay | Admitting: *Deleted

## 2022-11-27 DIAGNOSIS — R234 Changes in skin texture: Secondary | ICD-10-CM

## 2022-12-11 ENCOUNTER — Ambulatory Visit
Admission: RE | Admit: 2022-12-11 | Discharge: 2022-12-11 | Disposition: A | Discharge status: Home / Self Care | Payer: Medicaid Other | Source: Ambulatory Visit | Attending: Obstetrics & Gynecology | Admitting: Obstetrics & Gynecology

## 2022-12-11 DIAGNOSIS — R234 Changes in skin texture: Secondary | ICD-10-CM

## 2023-06-25 ENCOUNTER — Encounter (HOSPITAL_BASED_OUTPATIENT_CLINIC_OR_DEPARTMENT_OTHER): Payer: Self-pay | Admitting: Obstetrics & Gynecology

## 2023-06-29 ENCOUNTER — Other Ambulatory Visit (HOSPITAL_BASED_OUTPATIENT_CLINIC_OR_DEPARTMENT_OTHER): Payer: Self-pay | Admitting: Obstetrics & Gynecology

## 2023-06-29 DIAGNOSIS — M6289 Other specified disorders of muscle: Secondary | ICD-10-CM

## 2023-07-19 ENCOUNTER — Ambulatory Visit: Payer: Medicaid Other | Attending: Obstetrics & Gynecology | Admitting: Physical Therapy

## 2023-07-19 ENCOUNTER — Encounter: Payer: Self-pay | Admitting: Physical Therapy

## 2023-07-19 ENCOUNTER — Other Ambulatory Visit: Payer: Self-pay

## 2023-07-19 DIAGNOSIS — R293 Abnormal posture: Secondary | ICD-10-CM | POA: Insufficient documentation

## 2023-07-19 DIAGNOSIS — M6281 Muscle weakness (generalized): Secondary | ICD-10-CM | POA: Diagnosis present

## 2023-07-19 DIAGNOSIS — R279 Unspecified lack of coordination: Secondary | ICD-10-CM | POA: Insufficient documentation

## 2023-07-19 DIAGNOSIS — M6289 Other specified disorders of muscle: Secondary | ICD-10-CM | POA: Diagnosis present

## 2023-07-19 DIAGNOSIS — M62838 Other muscle spasm: Secondary | ICD-10-CM | POA: Diagnosis present

## 2023-07-19 NOTE — Therapy (Signed)
OUTPATIENT PHYSICAL THERAPY FEMALE PELVIC EVALUATION   Patient Name: [name] MRN: [id number] DOB:[birth year], 37 y.o., female Today's Date: 07/19/2023  END OF SESSION:  PT End of Session - 07/19/23 1534     Visit Number 1    Date for PT Re-Evaluation 11/17/23    Authorization Type [place] MEDICAID [employer]    PT Start Time 1530    PT Stop Time 1612    PT Time Calculation (min) 42 min    Activity Tolerance Patient tolerated treatment well    Behavior During Therapy [facility] for tasks assessed/performed             Past Medical History:  Diagnosis Date   Anxiety    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 Formatting of this note might be different from the original.  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  0.25 cm carci   Depression    Family history of breast cancer    Family history of prostate cancer    Gestational diabetes 09/16/2019   Infertility, female    clomid   Migraines    Ovarian cyst    dermoid    PCOS (polycystic ovarian syndrome)    Stomach problems    (migraines or spasms) since age 84    Past Surgical History:  Procedure Laterality Date   COLONOSCOPY  2011   GALLBLADDER SURGERY     IUD REMOVAL  01/14/2019   LAPAROSCOPIC OVARIAN CYSTECTOMY Right 12/24/2013   Procedure: LAPAROSCOPIC OVARIAN CYSTECTOMY;  Surgeon: Bennye Alm, MD;  Location: [facility];  Service: Gynecology;  Laterality: Right;   ROBOTIC ASSISTED LAPAROSCOPIC OVARIAN CYSTECTOMY Left 01/23/2019   Procedure: XI ROBOTIC ASSISTED LAPAROSCOPIC LEFT  OVARIAN CYSTECTOMY;  Surgeon: Adolphus Birchwood, MD;  Location: [facility];  Service: Gynecology;  Laterality: Left;   ROBOTIC ASSISTED SALPINGO OOPHERECTOMY Right  01/23/2019   Procedure: XI ROBOTIC ASSISTED RIGHT SALPINGO OOPHORECTOMY;  Surgeon: Adolphus Birchwood, MD;  Location: [facility];  Service: Gynecology;  Laterality: Right;   UPPER GI ENDOSCOPY  2011   WISDOM TOOTH EXTRACTION     Patient Active Problem List   Diagnosis Date Noted   Dermoid cyst of left ovary 11/17/2022   History of postpartum depression 08/22/2019   History of right salpingo-oophorectomy 07/11/2019   Family history of breast cancer    Family history of prostate cancer    Anxiety 03/06/2015   Female infertility 03/06/2015   Obesity, Class III, BMI 40-49.9 (morbid obesity) (HCC) 02/11/2014   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   Migraines    Depression     PCP: Betsey Holiday, PA-C  REFERRING PROVIDER: Jerene Bears, MD  REFERRING DIAG: [phone] (ICD-10-CM) - Pelvic floor dysfunction  THERAPY DIAG:  Muscle weakness (generalized) - Plan: PT plan of care cert/re-cert  Abnormal posture - Plan: PT plan of care cert/re-cert  Unspecified lack of coordination - Plan: PT plan of care cert/re-cert  Other muscle spasm - Plan: PT plan of care cert/re-cert  Rationale for Evaluation and Treatment: Rehabilitation  ONSET DATE: 5 months  SUBJECTIVE:  SUBJECTIVE STATEMENT: Vaginal pressure the first 2 days on period while sitting on toilet only. Has tailbone pain for 5 months, about 3 months after finishing breastfeeding (3rd baby)   Magnesium citrate daily for bowels, does take cholesterinum for mold detox  PAIN:  Are you having pain? Yes NPRS scale: 8/10 Pain location:  coccyx  Pain type: sharp and throbbing, shooting Pain description: intermittent   Aggravating factors: sitting longer 40 mins esp on soft surface, go to standing from sitting after awhile Relieving factors: changing position,  leaning forward  PRECAUTIONS: None  RED FLAGS: None   WEIGHT BEARING RESTRICTIONS: No  FALLS:  Has patient fallen in last 6 months? 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   PLOF: Independent  PATIENT GOALS: to have less pain  PERTINENT HISTORY:  Anxiety, depression, PCOS, ovary removed Sexual abuse: No  BOWEL MOVEMENT: Pain with bowel movement: No Type of bowel movement:Type (Bristol Stool Scale) 2-4, Frequency daily , and Strain No Fully empty rectum: Yes:   Leakage: No Pads: No Fiber supplement: No  URINATION: Pain with urination: No Fully empty bladder: Yes:   Stream: Strong Urgency: No Frequency: not quicker than every 2 hours, 1-2x nightly Leakage:  jumping on trampoline while laughing Pads: No  INTERCOURSE: Pain with intercourse:  not painful Ability to have vaginal penetration:  Yes:   Climax: not painful Marinoff Scale: 0/3  PREGNANCY: Vaginal deliveries 3 Tearing Yes: episiotomy with first, small tearing with second stitching needed, micro tearing no stitches with third C-section deliveries 0 Currently pregnant No  PROLAPSE: Pressure vaginally while on period for the first couple days while on toilet    OBJECTIVE:  Note: Objective measures were completed at Evaluation unless otherwise noted.  DIAGNOSTIC FINDINGS:  No imaging   PATIENT SURVEYS:    PFIQ-7 29  COGNITION: Overall cognitive status: Within functional limits for tasks assessed     SENSATION: Bil hamstrings and adductors limited by 25%  MUSCLE LENGTH: Bil hamstrings and adductors limited by 25%  LUMBAR SPECIAL TESTS:  Single leg stance test: Negative, SI Compression/distraction test: Negative, and FABER test: Negative  FUNCTIONAL TESTS:  Functional squat completed but with decent decreased by ~45%  GAIT: WFL  POSTURE: rounded shoulders and forward head  PELVIC ALIGNMENT:  WFL  LUMBARAROM/PROM:  A/PROM A/PROM  eval  Flexion WFL  Extension WFL  Right lateral flexion Limited by 25%  Left lateral flexion Limited by 25%  Right rotation Limited by 25%  Left rotation Limited by 25%   (Blank rows = not tested)  LOWER EXTREMITY ROM:  WFL  LOWER EXTREMITY MMT:   Bil hips grossly 4/5, knees 5/5 PALPATION:   General  no TTP at abdomen, pubic bone, proximal adductors, does have tight bil piriformis and lumbar and thoracic paraspinals                External Perineal Exam rectal assessment completed - skin darkening around EAS, no pain or skin irritation noted                             Internal Pelvic Floor discomfort reported with palpation at anterior (12 on clock face) toward coccyx and increased tension throughout from 9-3 on clock face of EAS (tailbone 12).   Patient confirms identification and approves PT to assess internal pelvic floor and treatment Yes No emotional/communication barriers or cognitive limitation. Patient  is motivated to learn. Patient understands and agrees with treatment goals and plan. PT explains patient will be examined in standing, sitting, and lying down to see how their muscles and joints work. When they are ready, they will be asked to remove their underwear so PT can examine their perineum. The patient is also given the option of providing their own chaperone as one is not provided in our facility. The patient also has the right and is explained the right to defer or refuse any part of the evaluation or treatment including the internal exam. With the patient's consent, PT will use one gloved finger to gently assess the muscles of the pelvic floor, seeing how well it contracts and relaxes and if there is muscle symmetry. After, the patient will get dressed and PT and patient will discuss exam findings and plan of care. PT and patient discuss plan of care, schedule, attendance policy and HEP activities.  PELVIC MMT:   MMT eval   Vaginal   Internal Anal Sphincter 4/5  External Anal Sphincter 5/5  Puborectalis 4/5  Diastasis Recti   (Blank rows = not tested)        TONE: Slightly increased  PROLAPSE: Not seen with rectal assessment  TODAY'S TREATMENT:                                                                                                                              DATE:   07/19/23 EVAL Examination completed, findings reviewed, pt educated on POC, HEP. Pt motivated to participate in PT and agreeable to attempt recommendations.  Manual completed at rectal pelvic floor with gentle stretching and trigger point release throughout anterior (9-3 on clock face). Fair release completed but tension continued.   If treatment provided at initial evaluation, no treatment charged due to lack of authorization.      PATIENT EDUCATION:  Education details: Sales executive Person educated: Patient Education method: Solicitor, Actor cues, Verbal cues, and Handouts Education comprehension: verbalized understanding, returned demonstration, verbal cues required, tactile cues required, and needs further education  HOME EXERCISE PROGRAM: [id number]  ASSESSMENT:  CLINICAL IMPRESSION: Patient is a 37 y.o. female  who was seen today for physical therapy evaluation and treatment for coccyx pain. Pt has had pain for the past 5 months, slight improvement with position changes but hasn't been able to relief with mobility or stretching on her own. did have pain similar to this after previous child but resolved with PFPT and now returning. Pt found to have decreased flexibility in spine and hips, decreased core and hip strength, tight paraspinals. Patient consented to internal pelvic floor assessment rectal this date and found to have tightness at posterior pelvic floor. Pt did have TTP here and tolerated gentle manual well and reported some relief after brief stretching internally. But tension continues and would benefit  from additional PT for improved symptoms management.    OBJECTIVE IMPAIRMENTS: decreased activity tolerance, decreased coordination, decreased endurance, decreased  strength, increased fascial restrictions, increased muscle spasms, impaired flexibility, impaired tone, improper body mechanics, postural dysfunction, and pain.   ACTIVITY LIMITATIONS: sitting and continence  PARTICIPATION LIMITATIONS: community activity  PERSONAL FACTORS: Time since onset of injury/illness/exacerbation are also affecting patient's functional outcome.   REHAB POTENTIAL: Good  CLINICAL DECISION MAKING: Stable/uncomplicated  EVALUATION COMPLEXITY: Low   GOALS: Goals reviewed with patient? Yes  SHORT TERM GOALS: Target date: 08/16/23  Pt to be I with HEP.  Baseline: Goal status: INITIAL  2.  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  Baseline:  Goal status: INITIAL  3.  Pt to be I with relaxation techniques for improved tissue mobility at posterior pelvic floor and decreased pain.  Baseline:  Goal status: INITIAL  4.  Pt to be I with voiding mechanics to improved evacuation of bowels without pressure.  Baseline:  Goal status: INITIAL  LONG TERM GOALS: Target date: 11/17/23  Pt to be I with advanced HEP.  Baseline:  Goal status: INITIAL  2.  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  Baseline:  Goal status: INITIAL  3.  Pt to be demonstrate full ROM of trunk in all directions for decreased strain at pelvic floor.  Baseline:  Goal status: INITIAL  4.  Pt to demonstrate 5/5 hip strength for improved pelvis stability and decreased coccyx pain Baseline:  Goal status: INITIAL  5.  Pt to demonstrate improved core strength by tolerating good posture for at least 45 mins for decreased pelvic floor  compensatory overuse and pain.  Baseline:  Goal status: INITIAL   PLAN:  PT FREQUENCY: 1-2x/week  PT DURATION:  8 sessions  PLANNED INTERVENTIONS: 97110-Therapeutic exercises, 97530- Therapeutic activity, 97112- Neuromuscular re-education, 97535- Self Care, [id number]- Manual therapy, Patient/Family education, Taping, Dry Needling, Joint mobilization, Spinal mobilization, Scar mobilization, Cryotherapy, Moist heat, and Biofeedback  PLAN FOR NEXT SESSION: core and hip stretching breathing mechanics, internal for posterior pelvic floor mobility, voiding mechanics, hip and core strengthening   Otelia Sergeant, PT, DPT 12/12/248:44 PM

## 2023-07-23 ENCOUNTER — Ambulatory Visit: Payer: Medicaid Other | Admitting: Physical Therapy

## 2023-07-23 DIAGNOSIS — R279 Unspecified lack of coordination: Secondary | ICD-10-CM

## 2023-07-23 DIAGNOSIS — M6281 Muscle weakness (generalized): Secondary | ICD-10-CM | POA: Diagnosis not present

## 2023-07-23 DIAGNOSIS — R293 Abnormal posture: Secondary | ICD-10-CM

## 2023-07-23 NOTE — Therapy (Signed)
OUTPATIENT PHYSICAL THERAPY FEMALE PELVIC EVALUATION   Patient Name: [name] MRN: [id number] DOB:02-08-1986, 37 y.o., female Today's Date: 07/23/2023  END OF SESSION:  PT End of Session - 07/23/23 1406     Visit Number 2    Date for PT Re-Evaluation 11/17/23    Authorization Type [place] MEDICAID [employer]    PT Start Time 1403    PT Stop Time 1459    PT Time Calculation (min) 56 min    Activity Tolerance Patient tolerated treatment well    Behavior During Therapy [facility] for tasks assessed/performed              Past Medical History:  Diagnosis Date   Anxiety    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 Formatting of this note might be different from the original.  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  0.25 cm carci   Depression    Family history of breast cancer    Family history of prostate cancer    Gestational diabetes 09/16/2019   Infertility, female    clomid   Migraines    Ovarian cyst    dermoid    PCOS (polycystic ovarian syndrome)    Stomach problems    (migraines or spasms) since age 37    Past Surgical History:  Procedure Laterality Date   COLONOSCOPY  2011   GALLBLADDER SURGERY     IUD REMOVAL  01/14/2019   LAPAROSCOPIC OVARIAN CYSTECTOMY Right 12/24/2013   Procedure: LAPAROSCOPIC OVARIAN CYSTECTOMY;  Surgeon: Bennye Alm, MD;  Location: [facility];  Service: Gynecology;  Laterality: Right;   ROBOTIC ASSISTED LAPAROSCOPIC OVARIAN CYSTECTOMY Left 01/23/2019   Procedure: XI ROBOTIC ASSISTED LAPAROSCOPIC LEFT  OVARIAN CYSTECTOMY;  Surgeon: Adolphus Birchwood, MD;  Location: [facility];  Service: Gynecology;  Laterality: Left;   ROBOTIC ASSISTED SALPINGO OOPHERECTOMY Right  01/23/2019   Procedure: XI ROBOTIC ASSISTED RIGHT SALPINGO OOPHORECTOMY;  Surgeon: Adolphus Birchwood, MD;  Location: [facility];  Service: Gynecology;  Laterality: Right;   UPPER GI ENDOSCOPY  2011   WISDOM TOOTH EXTRACTION     Patient Active Problem List   Diagnosis Date Noted   Dermoid cyst of left ovary 11/17/2022   History of postpartum depression 08/22/2019   History of right salpingo-oophorectomy 07/11/2019   Family history of breast cancer    Family history of prostate cancer    Anxiety 03/06/2015   Female infertility 03/06/2015   Obesity, Class III, BMI 40-49.9 (morbid obesity) (HCC) 02/11/2014   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 Tailbone is about the same "maybe a little less".  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 Yes NPRS scale: 8/10 Pain location:  coccyx  Pain type: sharp and throbbing, shooting Pain description: intermittent   Aggravating factors: sitting longer 40 mins esp on soft surface, go to standing from sitting after awhile Relieving factors: changing position, leaning forward  PRECAUTIONS: None  RED FLAGS: None   WEIGHT  BEARING RESTRICTIONS: No  FALLS:  Has patient fallen in last 6 months? 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   PLOF: Independent  PATIENT GOALS: to have less pain  PERTINENT HISTORY:  Anxiety, depression, PCOS, ovary removed Sexual abuse: No  BOWEL MOVEMENT: Pain with bowel movement: No Type of bowel movement:Type (Bristol Stool Scale) 2-4, Frequency daily , and Strain No Fully empty rectum: Yes:   Leakage: No Pads: No Fiber supplement: No  URINATION: Pain with urination: No Fully empty bladder: Yes:   Stream: Strong Urgency: No Frequency: not quicker than every 2 hours, 1-2x nightly Leakage:  jumping on trampoline while laughing Pads: No  INTERCOURSE: Pain with intercourse:  not painful Ability to have vaginal penetration:  Yes:   Climax: not painful Marinoff Scale: 0/3  PREGNANCY: Vaginal deliveries 3 Tearing Yes: episiotomy with first, small tearing with second stitching needed, micro tearing no stitches with third C-section deliveries 0 Currently pregnant No  PROLAPSE: Pressure vaginally while on period for the first couple days while on toilet    OBJECTIVE:  Note: Objective measures were completed at Evaluation unless otherwise noted.  DIAGNOSTIC FINDINGS:  No imaging   PATIENT SURVEYS:    PFIQ-7 29  COGNITION: Overall cognitive status: Within functional limits for tasks assessed     SENSATION: Bil hamstrings and adductors limited by 25%  MUSCLE LENGTH: Bil hamstrings and adductors limited by 25%  LUMBAR SPECIAL TESTS:  Single leg stance test: Negative, SI Compression/distraction test: Negative, and FABER test: Negative  FUNCTIONAL TESTS:  Functional squat completed but with decent decreased by ~45%  GAIT: WFL  POSTURE: rounded shoulders and forward head  PELVIC ALIGNMENT: WFL  LUMBARAROM/PROM:  A/PROM A/PROM  eval  Flexion WFL   Extension WFL  Right lateral flexion Limited by 25%  Left lateral flexion Limited by 25%  Right rotation Limited by 25%  Left rotation Limited by 25%   (Blank rows = not tested)  LOWER EXTREMITY ROM:  WFL  LOWER EXTREMITY MMT:   Bil hips grossly 4/5, knees 5/5 PALPATION:   General  no TTP at abdomen, pubic bone, proximal adductors, does have tight bil piriformis and lumbar and thoracic paraspinals                External Perineal Exam rectal assessment completed - skin darkening around EAS, no pain or skin irritation noted                             Internal Pelvic Floor discomfort reported with palpation at anterior (12 on clock face) toward coccyx and increased tension throughout from 9-3 on clock face of EAS (tailbone 12).   Patient confirms identification and approves PT to assess internal pelvic floor and treatment Yes No emotional/communication barriers or cognitive limitation. Patient is motivated to learn. Patient understands and agrees with treatment  goals and plan. PT explains patient will be examined in standing, sitting, and lying down to see how their muscles and joints work. When they are ready, they will be asked to remove their underwear so PT can examine their perineum. The patient is also given the option of providing their own chaperone as one is not provided in our facility. The patient also has the right and is explained the right to defer or refuse any part of the evaluation or treatment including the internal exam. With the patient's consent, PT will use one gloved finger to gently assess the muscles of the pelvic floor, seeing how well it contracts and relaxes and if there is muscle symmetry. After, the patient will get dressed and PT and patient will discuss exam findings and plan of care. PT and patient discuss plan of care, schedule, attendance policy and HEP activities.  PELVIC MMT:   MMT eval  Vaginal   Internal Anal Sphincter 4/5  External Anal Sphincter  5/5  Puborectalis 4/5  Diastasis Recti   (Blank rows = not tested)        TONE: Slightly increased  PROLAPSE: Not seen with rectal assessment  TODAY'S TREATMENT:                                                                                                                              DATE:   07/19/23 EVAL Examination completed, findings reviewed, pt educated on POC, HEP. Pt motivated to participate in PT and agreeable to attempt recommendations.  Manual completed at rectal pelvic floor with gentle stretching and trigger point release throughout anterior (9-3 on clock face). Fair release completed but tension continued.  07/23/23  Manual - grade 1-2 mobs over sacrum with pt in prone for improved mobility and decreased tension. Soft tissue release over bil piriformis however Rt greatly less tension than Lt. Pt reports she felt much more tenderness and tightness here as well. Released fairly well with manual work.  Patient consented to internal pelvic floor treatment rectally this date and found to have continued tension over Lt pelvic floor and toward coccyx. Pt does endorse she felt tension with palpation here as well. Pt demonstrated muscle twitching with gentle pressure over superficial and deep Lt pelvic floor and released fairly well with cues for relaxation. X10 gentle grade 1 mobs P>A at coccyx as well with fair mobility noted, pt did report this was very similar to pain felt at tailbone initially, with reps and improved release of surrounding muscle pt reported no pain.    Pt educated on dry needling and possible benefits from this for improved muscle tension at gluteals and possibly dry needling of pelvic floor if needed. Pt agreed to attempt dry needling glutes and low back first if needed. Handout given.    PATIENT EDUCATION:  Education details: Sales executive Person educated: Patient Education method: Programmer, multimedia, Facilities manager, Actor cues, Verbal cues, and Handouts Education  comprehension: verbalized understanding, returned demonstration, verbal  cues required, tactile cues required, and needs further education  HOME EXERCISE PROGRAM: [id number]  ASSESSMENT:  CLINICAL IMPRESSION: Patient presents for treatment, focus on manual work internally and externally for decreased pain at coccyx. Pt tolerated well, did have fair release of muscles internally. At end of session once returned to dressing pt reports she felt a little sore but no pain. PT encouraged pt to stretch again this evening and pt agreed. would benefit from additional PT for improved symptoms management.    OBJECTIVE IMPAIRMENTS: decreased activity tolerance, decreased coordination, decreased endurance, decreased strength, increased fascial restrictions, increased muscle spasms, impaired flexibility, impaired tone, improper body mechanics, postural dysfunction, and pain.   ACTIVITY LIMITATIONS: sitting and continence  PARTICIPATION LIMITATIONS: community activity  PERSONAL FACTORS: Time since onset of injury/illness/exacerbation are also affecting patient's functional outcome.   REHAB POTENTIAL: Good  CLINICAL DECISION MAKING: Stable/uncomplicated  EVALUATION COMPLEXITY: Low   GOALS: Goals reviewed with patient? Yes  SHORT TERM GOALS: Target date: 08/16/23  Pt to be I with HEP.  Baseline: Goal status: INITIAL  2.  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  Baseline:  Goal status: INITIAL  3.  Pt to be I with relaxation techniques for improved tissue mobility at posterior pelvic floor and decreased pain.  Baseline:  Goal status: INITIAL  4.  Pt to be I with voiding mechanics to improved evacuation of bowels without pressure.  Baseline:  Goal status: INITIAL  LONG TERM GOALS: Target date: 11/17/23  Pt to be I with advanced HEP.  Baseline:  Goal status: INITIAL  2.  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  Baseline:  Goal status: INITIAL  3.  Pt to be demonstrate full ROM of trunk in all directions for decreased strain at pelvic floor.  Baseline:  Goal status: INITIAL  4.  Pt to demonstrate 5/5 hip strength for improved pelvis stability and decreased coccyx pain Baseline:  Goal status: INITIAL  5.  Pt to demonstrate improved core strength by tolerating good posture for at least 45 mins for decreased pelvic floor compensatory overuse and pain.  Baseline:  Goal status: INITIAL   PLAN:  PT FREQUENCY: 1-2x/week  PT DURATION:  8 sessions  PLANNED INTERVENTIONS: 97110-Therapeutic exercises, 97530- Therapeutic activity, 97112- Neuromuscular re-education, 97535- Self Care, 64332- Manual therapy, Patient/Family education, Taping, Dry Needling, Joint mobilization, Spinal mobilization, Scar mobilization, Cryotherapy, Moist heat, and Biofeedback  PLAN FOR NEXT SESSION: core and hip stretching breathing mechanics, internal for posterior pelvic floor mobility, voiding mechanics, hip and core strengthening   Otelia Sergeant, PT, DPT 12/16/243:19 PM

## 2023-07-23 NOTE — Patient Instructions (Signed)

## 2023-07-30 ENCOUNTER — Ambulatory Visit: Payer: Self-pay

## 2023-08-03 ENCOUNTER — Ambulatory Visit: Payer: Medicaid Other

## 2023-08-03 DIAGNOSIS — R279 Unspecified lack of coordination: Secondary | ICD-10-CM

## 2023-08-03 DIAGNOSIS — M6281 Muscle weakness (generalized): Secondary | ICD-10-CM

## 2023-08-03 DIAGNOSIS — M62838 Other muscle spasm: Secondary | ICD-10-CM

## 2023-08-03 DIAGNOSIS — R293 Abnormal posture: Secondary | ICD-10-CM

## 2023-08-03 NOTE — Therapy (Signed)
OUTPATIENT PHYSICAL THERAPY FEMALE PELVIC TREATMENT   Patient Name: [name] MRN: [id number] DOB:04/26/1986, 37 y.o., female Today's Date: 08/03/2023  END OF SESSION:  PT End of Session - 08/03/23 1104     Visit Number 3    Date for PT Re-Evaluation 11/17/23    Authorization Type [place] MEDICAID [employer]    PT Start Time 1104    PT Stop Time 1144    PT Time Calculation (min) 40 min    Activity Tolerance Patient tolerated treatment well    Behavior During Therapy [facility] for tasks assessed/performed               Past Medical History:  Diagnosis Date   Anxiety    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 Formatting of this note might be different from the original.  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  0.25 cm carci   Depression    Family history of breast cancer    Family history of prostate cancer    Gestational diabetes 09/16/2019   Infertility, female    clomid   Migraines    Ovarian cyst    dermoid    PCOS (polycystic ovarian syndrome)    Stomach problems    (migraines or spasms) since age 53    Past Surgical History:  Procedure Laterality Date   COLONOSCOPY  2011   GALLBLADDER SURGERY     IUD REMOVAL  01/14/2019   LAPAROSCOPIC OVARIAN CYSTECTOMY Right 12/24/2013   Procedure: LAPAROSCOPIC OVARIAN CYSTECTOMY;  Surgeon: Bennye Alm, MD;  Location: [facility];  Service: Gynecology;  Laterality: Right;   ROBOTIC ASSISTED LAPAROSCOPIC OVARIAN CYSTECTOMY Left 01/23/2019   Procedure: XI ROBOTIC ASSISTED LAPAROSCOPIC LEFT  OVARIAN CYSTECTOMY;  Surgeon: Adolphus Birchwood, MD;  Location: [facility];  Service: Gynecology;  Laterality: Left;   ROBOTIC ASSISTED SALPINGO OOPHERECTOMY Right  01/23/2019   Procedure: XI ROBOTIC ASSISTED RIGHT SALPINGO OOPHORECTOMY;  Surgeon: Adolphus Birchwood, MD;  Location: [facility];  Service: Gynecology;  Laterality: Right;   UPPER GI ENDOSCOPY  2011   WISDOM TOOTH EXTRACTION     Patient Active Problem List   Diagnosis Date Noted   Dermoid cyst of left ovary 11/17/2022   History of postpartum depression 08/22/2019   History of right salpingo-oophorectomy 07/11/2019   Family history of breast cancer    Family history of prostate cancer    Anxiety 03/06/2015   Female infertility 03/06/2015   Obesity, Class III, BMI 40-49.9 (morbid obesity) (HCC) 02/11/2014   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   Migraines    Depression     PCP: Betsey Holiday, PA-C  REFERRING PROVIDER: Jerene Bears, MD  REFERRING DIAG: [id number] (ICD-10-CM) - Pelvic floor dysfunction  THERAPY DIAG:  Muscle weakness (generalized) - Plan: PT plan of care cert/re-cert  Abnormal posture - Plan: PT plan of care cert/re-cert  Unspecified lack of coordination - Plan: PT plan of care cert/re-cert  Other muscle spasm - Plan: PT plan of care cert/re-cert  Rationale for Evaluation and Treatment: Rehabilitation  ONSET DATE: 5 months  SUBJECTIVE:  SUBJECTIVE STATEMENT: Pt states that she continues to have coccyx pain that is worse sitting on soft surfaces. She feels like last session was helpful. She is working on stretches regularly. She's had more pain the last couple of days.   PAIN:  Are you having pain? Yes NPRS scale: 610 Pain location:  coccyx/low back  Pain type: sharp and throbbing, shooting Pain description: intermittent   Aggravating factors: sitting longer 40 mins esp on soft surface, go to standing from sitting after awhile Relieving factors: changing position, leaning  forward  PRECAUTIONS: None  RED FLAGS: None   WEIGHT BEARING RESTRICTIONS: No  FALLS:  Has patient fallen in last 6 months? 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   PLOF: Independent  PATIENT GOALS: to have less pain  PERTINENT HISTORY:  Anxiety, depression, PCOS, ovary removed Sexual abuse: No  BOWEL MOVEMENT: Pain with bowel movement: No Type of bowel movement:Type (Bristol Stool Scale) 2-4, Frequency daily , and Strain No Fully empty rectum: Yes:   Leakage: No Pads: No Fiber supplement: No  URINATION: Pain with urination: No Fully empty bladder: Yes:   Stream: Strong Urgency: No Frequency: not quicker than every 2 hours, 1-2x nightly Leakage:  jumping on trampoline while laughing Pads: No  INTERCOURSE: Pain with intercourse:  not painful Ability to have vaginal penetration:  Yes:   Climax: not painful Marinoff Scale: 0/3  PREGNANCY: Vaginal deliveries 3 Tearing Yes: episiotomy with first, small tearing with second stitching needed, micro tearing no stitches with third C-section deliveries 0 Currently pregnant No  PROLAPSE: Pressure vaginally while on period for the first couple days while on toilet    OBJECTIVE:  Note: Objective measures were completed at Evaluation unless otherwise noted.  DIAGNOSTIC FINDINGS:  No imaging   PATIENT SURVEYS:    PFIQ-7 29  COGNITION: Overall cognitive status: Within functional limits for tasks assessed     SENSATION: Bil hamstrings and adductors limited by 25%  MUSCLE LENGTH: Bil hamstrings and adductors limited by 25%  LUMBAR SPECIAL TESTS:  Single leg stance test: Negative, SI Compression/distraction test: Negative, and FABER test: Negative  FUNCTIONAL TESTS:  Functional squat completed but with decent decreased by ~45%  GAIT: WFL  POSTURE: rounded shoulders and forward head  PELVIC ALIGNMENT:  WFL  LUMBARAROM/PROM:  A/PROM A/PROM  eval  Flexion WFL  Extension WFL  Right lateral flexion Limited by 25%  Left lateral flexion Limited by 25%  Right rotation Limited by 25%  Left rotation Limited by 25%   (Blank rows = not tested)  LOWER EXTREMITY ROM:  WFL  LOWER EXTREMITY MMT:   Bil hips grossly 4/5, knees 5/5 PALPATION:   General  no TTP at abdomen, pubic bone, proximal adductors, does have tight bil piriformis and lumbar and thoracic paraspinals                External Perineal Exam rectal assessment completed - skin darkening around EAS, no pain or skin irritation noted                             Internal Pelvic Floor discomfort reported with palpation at anterior (12 on clock face) toward coccyx and increased tension throughout from 9-3 on clock face of EAS (tailbone 12).   Patient confirms identification and approves PT to assess internal pelvic floor and treatment Yes No emotional/communication barriers or cognitive limitation. Patient  is motivated to learn. Patient understands and agrees with treatment goals and plan. PT explains patient will be examined in standing, sitting, and lying down to see how their muscles and joints work. When they are ready, they will be asked to remove their underwear so PT can examine their perineum. The patient is also given the option of providing their own chaperone as one is not provided in our facility. The patient also has the right and is explained the right to defer or refuse any part of the evaluation or treatment including the internal exam. With the patient's consent, PT will use one gloved finger to gently assess the muscles of the pelvic floor, seeing how well it contracts and relaxes and if there is muscle symmetry. After, the patient will get dressed and PT and patient will discuss exam findings and plan of care. PT and patient discuss plan of care, schedule, attendance policy and HEP activities.  PELVIC MMT:   MMT eval   Vaginal   Internal Anal Sphincter 4/5  External Anal Sphincter 5/5  Puborectalis 4/5  Diastasis Recti   (Blank rows = not tested)        TONE: Slightly increased  PROLAPSE: Not seen with rectal assessment  TODAY'S TREATMENT:                                                                                                                              DATE:   08/03/23 Manual: Trigger Point Dry-Needling  Treatment instructions: Expect mild to moderate muscle soreness. S/S of pneumothorax if dry needled over a lung field, and to seek immediate medical attention should they occur. Patient verbalized understanding of these instructions and education.  Patient Consent Given: Yes Education handout provided: Yes Muscles treated: Bil glutes, piriformis, TFL Electrical stimulation performed: No Parameters: N/A Treatment response/outcome: twitch response/release Soft tissue mobilization to Bil glutes and Lt sacral/coccyx border Neuromuscular re-education: Diaphragmatic breathing/pelvic floor muscle relaxation thinking of filling beach ball, allowing coccyx to move posteriorly Therapeutic activities: Awareness of gluteal tension/gripping behaviors   07/19/23 EVAL Examination completed, findings reviewed, pt educated on POC, HEP. Pt motivated to participate in PT and agreeable to attempt recommendations.  Manual completed at rectal pelvic floor with gentle stretching and trigger point release throughout anterior (9-3 on clock face). Fair release completed but tension continued.  07/23/23  Manual - grade 1-2 mobs over sacrum with pt in prone for improved mobility and decreased tension. Soft tissue release over bil piriformis however Rt greatly less tension than Lt. Pt reports she felt much more tenderness and tightness here as well. Released fairly well with manual work.  Patient consented to internal pelvic floor treatment rectally this date and found to have continued tension over Lt pelvic  floor and toward coccyx. Pt does endorse she felt tension with palpation here as well. Pt demonstrated muscle twitching with gentle pressure over superficial and deep Lt pelvic floor and released fairly well with cues for  relaxation. X10 gentle grade 1 mobs P>A at coccyx as well with fair mobility noted, pt did report this was very similar to pain felt at tailbone initially, with reps and improved release of surrounding muscle pt reported no pain.    Pt educated on dry needling and possible benefits from this for improved muscle tension at gluteals and possibly dry needling of pelvic floor if needed. Pt agreed to attempt dry needling glutes and low back first if needed. Handout given.    PATIENT EDUCATION:  Education details: Sales executive Person educated: Patient Education method: Solicitor, Actor cues, Verbal cues, and Handouts Education comprehension: verbalized understanding, returned demonstration, verbal cues required, tactile cues required, and needs further education  HOME EXERCISE PROGRAM: [id number]  ASSESSMENT:  CLINICAL IMPRESSION: Today pt here for dry needling to Lt glutes. Performed to bil glutes due to significant restriction. Believe pt also has pelvic rotation (Lt sacral torsion and Lt posterior innominate rotation) that may be impacting coccydynia. Following dry needling to release trigger points to coccygeal attachments and bil glutes, manual release performed externally to Lt sacral/coccyx borders to further release tension. Pt does not feel like she clenches glutes, but she was gripping while lying on table; she was encouraged to increase awareness to see if she is doing this throughout the day, possibly leading to increased tension on coccyx and Lt hip pain. She did well with diaphragmatic breathing using multimodal cues to help improve coccyx mobility and pelvic floor muscle relaxation. She reported good decrease in discomfort at end of session. She will continue to  benefit from skilled PT intervention in order to decrease pain and improve functional ability.   OBJECTIVE IMPAIRMENTS: decreased activity tolerance, decreased coordination, decreased endurance, decreased strength, increased fascial restrictions, increased muscle spasms, impaired flexibility, impaired tone, improper body mechanics, postural dysfunction, and pain.   ACTIVITY LIMITATIONS: sitting and continence  PARTICIPATION LIMITATIONS: community activity  PERSONAL FACTORS: Time since onset of injury/illness/exacerbation are also affecting patient's functional outcome.   REHAB POTENTIAL: Good  CLINICAL DECISION MAKING: Stable/uncomplicated  EVALUATION COMPLEXITY: Low   GOALS: Goals reviewed with patient? Yes  SHORT TERM GOALS: Target date: 08/16/23  Pt to be I with HEP.  Baseline: Goal status: INITIAL  2.  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  Baseline:  Goal status: INITIAL  3.  Pt to be I with relaxation techniques for improved tissue mobility at posterior pelvic floor and decreased pain.  Baseline:  Goal status: INITIAL  4.  Pt to be I with voiding mechanics to improved evacuation of bowels without pressure.  Baseline:  Goal status: INITIAL  LONG TERM GOALS: Target date: 11/17/23  Pt to be I with advanced HEP.  Baseline:  Goal status: INITIAL  2.  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  Baseline:  Goal status: INITIAL  3.  Pt to be demonstrate full ROM of trunk in all directions for decreased strain at pelvic floor.  Baseline:  Goal status: INITIAL  4.  Pt to demonstrate 5/5 hip strength for improved pelvis stability and decreased coccyx pain Baseline:  Goal status: INITIAL  5.  Pt to demonstrate improved core strength by tolerating good posture for at least 45  mins for decreased pelvic floor compensatory overuse and pain.  Baseline:  Goal status: INITIAL  PLAN:  PT FREQUENCY: 1-2x/week  PT DURATION:  8 sessions  PLANNED INTERVENTIONS: 97110-Therapeutic exercises, 97530- Therapeutic activity, 97112- Neuromuscular re-education, 97535- Self Care, 78469- Manual therapy, Patient/Family education, Taping, Dry Needling, Joint mobilization, Spinal mobilization, Scar mobilization, Cryotherapy, Moist heat, and Biofeedback  PLAN FOR NEXT SESSION: core and hip stretching breathing mechanics, internal for posterior pelvic floor mobility, voiding mechanics, hip and core strengthening   Julio Alm, PT, DPT12/27/2411:51 AM

## 2023-08-03 NOTE — Patient Instructions (Signed)
 Trigger Point Dry Needling  What is Trigger Point Dry Needling (DN)? DN is a physical therapy technique used to treat muscle pain and dysfunction. Specifically, DN helps deactivate muscle trigger points (muscle knots).  A thin filiform needle is used to penetrate the skin and stimulate the underlying trigger point. The goal is for a local twitch response (LTR) to occur and for the trigger point to relax. No medication of any kind is injected during the procedure.   What Does Trigger Point Dry Needling Feel Like?  The procedure feels different for each individual patient. Some patients report that they do not actually feel the needle enter the skin and overall the process is not painful. Very mild bleeding may occur. However, many patients feel a deep cramping in the muscle in which the needle was inserted. This is the local twitch response.   How Will I feel after the treatment? Soreness is normal, and the onset of soreness may not occur for a few hours. Typically this soreness does not last longer than two days.  Bruising is uncommon, however; ice can be used to decrease any possible bruising.  In rare cases feeling tired or nauseous after the treatment is normal. In addition, your symptoms may get worse before they get better, this period will typically not last longer than 24 hours.   What Can I do After My Treatment? Increase your hydration by drinking more water for the next 24 hours.  You may place ice or heat on the areas treated that have become sore, however, do not use heat on inflamed or bruised areas. Heat often brings more relief post needling. You can continue your regular activities, but vigorous activity is not recommended initially after the treatment for 24 hours. DN is best combined with other physical therapy such as strengthening, stretching, and other therapies.   What are the complications? While your therapist has had extensive training in minimizing the risks of trigger  point dry needling, it is important to understand the risks of any procedure.  Risks include bleeding, pain, fatigue, hematoma, infection, vertigo, nausea or nerve involvement. Monitor for any changes to your skin or sensation. Contact your therapist or MD with concerns.  A rare but serious complication is a pneumothorax over or near your middle and upper chest and back If you have dry needling in this area, monitor for the following symptoms: Shortness of breath on exertion and/or Difficulty taking a deep breath and/or Chest Pain and/or A dry cough If any of the above symptoms develop, please go to the nearest emergency room or call 911. Tell them you had dry needling over your thorax and report any symptoms you are having. Please follow-up with your treating therapist after you complete the medical evaluation.   [facility]  [address] [place] [address].  [phone]

## 2023-08-06 ENCOUNTER — Encounter (HOSPITAL_BASED_OUTPATIENT_CLINIC_OR_DEPARTMENT_OTHER): Payer: Self-pay | Admitting: Obstetrics & Gynecology

## 2023-08-06 ENCOUNTER — Ambulatory Visit (HOSPITAL_BASED_OUTPATIENT_CLINIC_OR_DEPARTMENT_OTHER): Payer: Medicaid Other | Admitting: Obstetrics & Gynecology

## 2023-08-06 VITALS — BP 117/78 | HR 73 | Ht 65.0 in | Wt 228.2 lb

## 2023-08-06 DIAGNOSIS — R7303 Prediabetes: Secondary | ICD-10-CM | POA: Diagnosis not present

## 2023-08-06 DIAGNOSIS — E538 Deficiency of other specified B group vitamins: Secondary | ICD-10-CM

## 2023-08-06 DIAGNOSIS — E559 Vitamin D deficiency, unspecified: Secondary | ICD-10-CM | POA: Diagnosis not present

## 2023-08-06 DIAGNOSIS — Z803 Family history of malignant neoplasm of breast: Secondary | ICD-10-CM

## 2023-08-06 DIAGNOSIS — E282 Polycystic ovarian syndrome: Secondary | ICD-10-CM

## 2023-08-06 DIAGNOSIS — D3A098 Benign carcinoid tumors of other sites: Secondary | ICD-10-CM

## 2023-08-06 DIAGNOSIS — Z01419 Encounter for gynecological examination (general) (routine) without abnormal findings: Secondary | ICD-10-CM | POA: Diagnosis not present

## 2023-08-06 DIAGNOSIS — D271 Benign neoplasm of left ovary: Secondary | ICD-10-CM

## 2023-08-06 NOTE — Progress Notes (Signed)
37 y.o. G64P3002 Married White or Caucasian female here for annual exam.  Having regular cycles.  Flow lasts about 5 days.  Cycles are around 30-32 days apart.   H/o PCOS.  Had Mirena IUD in the past and had removed due to irregular bleeding.  LMP:  around 07/19/2023       Doing pelvic PT for coccyx pain.  Has been twice.  May need imaging at some point or seeing sports medicine.    Sexually active: Yes.    The current method of family planning is vasectomy.    Smoker:  no  Health Maintenance: Pap:  07/06/21 Negative History of abnormal Pap:  ASCUS with neg HR HPV 2020 MMG:  12/11/2022 Negative Colonoscopy:  06/07/2010 Screening Labs: Vit D, B12, lipids and hba1c   reports that she has never smoked. She has never used smokeless tobacco. She reports that she does not currently use alcohol. She reports that she does not use drugs.  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 Formatting of this note might be different from the original.  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  0.25 cm carci   Depression    Family history of breast cancer    Family history of prostate cancer    Gestational diabetes 09/16/2019   Infertility, female    clomid   Migraines    Ovarian cyst    dermoid    PCOS (polycystic ovarian syndrome)    Stomach problems    (migraines or spasms) since age 55     Past Surgical History:  Procedure Laterality Date   COLONOSCOPY  2011   GALLBLADDER SURGERY     IUD REMOVAL  01/14/2019   LAPAROSCOPIC OVARIAN CYSTECTOMY Right 12/24/2013   Procedure: LAPAROSCOPIC OVARIAN CYSTECTOMY;  Surgeon: Bennye Alm, MD;  Location: [facility];  Service:  Gynecology;  Laterality: Right;   ROBOTIC ASSISTED LAPAROSCOPIC OVARIAN CYSTECTOMY Left 01/23/2019   Procedure: XI ROBOTIC ASSISTED LAPAROSCOPIC LEFT  OVARIAN CYSTECTOMY;  Surgeon: Adolphus Birchwood, MD;  Location: [facility];  Service: Gynecology;  Laterality: Left;   ROBOTIC ASSISTED SALPINGO OOPHERECTOMY Right 01/23/2019   Procedure: XI ROBOTIC ASSISTED RIGHT SALPINGO OOPHORECTOMY;  Surgeon: Adolphus Birchwood, MD;  Location: [facility];  Service: Gynecology;  Laterality: Right;   UPPER GI ENDOSCOPY  2011   WISDOM TOOTH EXTRACTION      Current Outpatient Medications  Medication Sig Dispense Refill   FLUoxetine (PROZAC) 20 MG capsule Take 20 mg by mouth daily.     No current facility-administered medications for this visit.    Family History  Problem Relation Age of Onset   Thyroid disease Mother    Hyperlipidemia Father    Cancer Father        prostate cancer   Breast cancer Maternal Grandmother 88       d. 43   Cancer Maternal Grandmother        bone cancer   Diabetes Maternal Grandfather    Breast cancer Other        MGMs sister   Breast cancer Other        MGMs sister   Cancer Other  MGMs brother with bone cancer   Anesthesia problems Neg Hx    Hypotension Neg Hx    Malignant hyperthermia Neg Hx    Pseudochol deficiency Neg Hx     ROS: Constitutional: negative Genitourinary:negative  Exam:   BP 117/78 (BP Location: Left Arm, Patient Position: Sitting, Cuff Size: Large)   Pulse 73   Ht 5\' 5"  (1.651 m)   Wt 228 lb 3.2 oz (103.5 kg)   LMP 07/19/2023 (Approximate)   BMI 37.97 kg/m   Height: 5\' 5"  (165.1 cm)  General appearance: alert, cooperative and appears stated age Head: Normocephalic, without obvious abnormality, atraumatic Neck: no adenopathy, supple, symmetrical, trachea midline and thyroid normal to inspection and palpation Lungs: clear to auscultation bilaterally Breasts: normal appearance, no masses or tenderness Heart: regular rate and rhythm Abdomen: soft,  non-tender; bowel sounds normal; no masses,  no organomegaly Extremities: extremities normal, atraumatic, no cyanosis or edema Skin: Skin color, texture, turgor normal. No rashes or lesions Lymph nodes: Cervical, supraclavicular, and axillary nodes normal. No abnormal inguinal nodes palpated Neurologic: Grossly normal   Pelvic: External genitalia:  no lesions              Urethra:  normal appearing urethra with no masses, tenderness or lesions              Bartholins and Skenes: normal                 Vagina: normal appearing vagina with normal color and no discharge, no lesions              Cervix: no lesions              Pap taken: No. Bimanual Exam:  Uterus:  normal size, contour, position, consistency, mobility, non-tender              Adnexa: normal adnexa and no mass, fullness, tenderness               Rectovaginal: Confirms               Anus:  normal sphincter tone, no lesions  Chaperone, Ina Homes, CMA, was present for exam.  Assessment/Plan: 1. Well woman exam with routine gynecological exam (Primary) - Pap smear 06/2021.  Will repeat next year. - Mammogram done 12/2022. - Colonoscopy guidelines reviewed - lab work ordered per orders below - vaccines reviewed/updated  2. Vitamin B 12 deficiency - Vitamin B12  3. Vitamin D deficiency - VITAMIN D 25 Hydroxy (Vit-D Deficiency, Fractures)  4. Prediabetes - Hemoglobin A1c  5. Carcinoid tumor of ovary - in 2015 with ovarian cystectomy - s/p RSO 2020 with gyn/oncology where only dermoid was present  6. Dermoid cyst of left ovary - s/p cystectomy in 2020 done by gyn/onc - u/s done 08/17/2021 with left ovary with PCOS appearance but no evidence of dermoid/mass  7. Family history of breast cancer - we discussed mammograms starting after age 9.  She did her first this summer.  Dr. Si Gaul, radiology, spoke personally with her and recommended starting at age 73 after reviewing her family hx and diagnostic imaging from this  summer.  Pt desires to wait until 40 for next screening.  8. PCOS (polycystic ovarian syndrome) - cycles are much more regular than in the past.  If no cycle for >90 days, will give provera challenge.

## 2023-08-07 LAB — VITAMIN D 25 HYDROXY (VIT D DEFICIENCY, FRACTURES): Vit D, 25-Hydroxy: 50.2 ng/mL (ref 30.0–100.0)

## 2023-08-07 LAB — HEMOGLOBIN A1C
Est. average glucose Bld gHb Est-mCnc: 117 mg/dL
Hgb A1c MFr Bld: 5.7 % — ABNORMAL HIGH (ref 4.8–5.6)

## 2023-08-07 LAB — VITAMIN B12: Vitamin B-12: 393 pg/mL (ref 232–1245)

## 2023-08-09 ENCOUNTER — Encounter: Payer: Self-pay | Admitting: Physical Therapy

## 2023-08-14 ENCOUNTER — Ambulatory Visit: Payer: Medicaid Other | Attending: Obstetrics & Gynecology

## 2023-08-14 DIAGNOSIS — R293 Abnormal posture: Secondary | ICD-10-CM | POA: Diagnosis present

## 2023-08-14 DIAGNOSIS — M6281 Muscle weakness (generalized): Secondary | ICD-10-CM | POA: Diagnosis present

## 2023-08-14 DIAGNOSIS — M62838 Other muscle spasm: Secondary | ICD-10-CM | POA: Diagnosis present

## 2023-08-14 DIAGNOSIS — R279 Unspecified lack of coordination: Secondary | ICD-10-CM | POA: Diagnosis present

## 2023-08-14 NOTE — Therapy (Signed)
 OUTPATIENT PHYSICAL THERAPY FEMALE PELVIC TREATMENT   Patient Name: [name] MRN: [id number] DOB:26-May-1986, 38 y.o., female Today's Date: 08/14/2023  END OF SESSION:  PT End of Session - 08/14/23 1536     Visit Number 4    Date for PT Re-Evaluation 11/17/23    Authorization Type [place] MEDICAID [employer]    PT Start Time 1531    PT Stop Time 1616    PT Time Calculation (min) 45 min    Activity Tolerance Patient tolerated treatment well    Behavior During Therapy [facility] for tasks assessed/performed               Past Medical History:  Diagnosis Date   Anxiety    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 Formatting of this note might be different from the original.  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  0.25 cm carci   Depression    Family history of breast cancer    Family history of prostate cancer    Gestational diabetes 09/16/2019   Infertility, female    clomid    Migraines    Ovarian cyst    dermoid    PCOS (polycystic ovarian syndrome)    Stomach problems    (migraines or spasms) since age 53    Past Surgical History:  Procedure Laterality Date   COLONOSCOPY  2011   GALLBLADDER SURGERY     IUD REMOVAL  01/14/2019   LAPAROSCOPIC OVARIAN CYSTECTOMY Right 12/24/2013   Procedure: LAPAROSCOPIC OVARIAN CYSTECTOMY;  Surgeon: Randine VEAR Fender, MD;  Location: [facility];  Service: Gynecology;  Laterality: Right;   ROBOTIC ASSISTED LAPAROSCOPIC OVARIAN CYSTECTOMY Left 01/23/2019   Procedure: XI ROBOTIC ASSISTED LAPAROSCOPIC LEFT  OVARIAN CYSTECTOMY;  Surgeon: Eloy Herring, MD;  Location: [facility];  Service: Gynecology;  Laterality: Left;   ROBOTIC ASSISTED SALPINGO OOPHERECTOMY Right  01/23/2019   Procedure: XI ROBOTIC ASSISTED RIGHT SALPINGO OOPHORECTOMY;  Surgeon: Eloy Herring, MD;  Location: [facility];  Service: Gynecology;  Laterality: Right;   UPPER GI ENDOSCOPY  2011   WISDOM TOOTH EXTRACTION     Patient Active Problem List   Diagnosis Date Noted   Dermoid cyst of left ovary 11/17/2022   History of postpartum depression 08/22/2019   History of right salpingo-oophorectomy 07/11/2019   Family history of breast cancer    Family history of prostate cancer    Anxiety 03/06/2015   Female infertility 03/06/2015   Obesity, Class III, BMI 40-49.9 (morbid obesity) (HCC) 02/11/2014   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   Migraines    Depression     PCP: Rosina Bullock, PA-C  REFERRING PROVIDER: Cleotilde Ronal GORMAN, MD  REFERRING DIAG: [phone] (ICD-10-CM) - Pelvic floor dysfunction  THERAPY DIAG:  Muscle weakness (generalized) - Plan: PT plan of care cert/re-cert  Abnormal posture - Plan: PT plan of care cert/re-cert  Unspecified lack of coordination - Plan: PT plan of care cert/re-cert  Other muscle spasm - Plan: PT plan of care cert/re-cert  Rationale for Evaluation and Treatment: Rehabilitation  ONSET DATE: 5 months  SUBJECTIVE:  SUBJECTIVE STATEMENT: Pt states that she was a little sore after last session with dry needling. She did not feel like dry needling felt any different. Harder surfaces continue to be better, but she still feels like she has to shift back and forth.   PAIN:  Are you having pain? Yes NPRS scale: 1/10 sitting normal, 3/10 leaning back seated Pain location:  coccyx/low back  Pain type: sharp and throbbing, shooting Pain description: intermittent   Aggravating factors: sitting longer 40 mins esp on soft surface, go to standing from sitting after  awhile Relieving factors: changing position, leaning forward  PRECAUTIONS: None  RED FLAGS: None   WEIGHT BEARING RESTRICTIONS: No  FALLS:  Has patient fallen in last 6 months? 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   PLOF: Independent  PATIENT GOALS: to have less pain  PERTINENT HISTORY:  Anxiety, depression, PCOS, ovary removed Sexual abuse: No  BOWEL MOVEMENT: Pain with bowel movement: No Type of bowel movement:Type (Bristol Stool Scale) 2-4, Frequency daily , and Strain No Fully empty rectum: Yes:   Leakage: No Pads: No Fiber supplement: No  URINATION: Pain with urination: No Fully empty bladder: Yes:   Stream: Strong Urgency: No Frequency: not quicker than every 2 hours, 1-2x nightly Leakage:  jumping on trampoline while laughing Pads: No  INTERCOURSE: Pain with intercourse:  not painful Ability to have vaginal penetration:  Yes:   Climax: not painful Marinoff Scale: 0/3  PREGNANCY: Vaginal deliveries 3 Tearing Yes: episiotomy with first, small tearing with second stitching needed, micro tearing no stitches with third C-section deliveries 0 Currently pregnant No  PROLAPSE: Pressure vaginally while on period for the first couple days while on toilet    OBJECTIVE:  Note: Objective measures were completed at Evaluation unless otherwise noted.  DIAGNOSTIC FINDINGS:  No imaging   PATIENT SURVEYS:    [id number] 29  COGNITION: Overall cognitive status: Within functional limits for tasks assessed     SENSATION: Bil hamstrings and adductors limited by 25%  MUSCLE LENGTH: Bil hamstrings and adductors limited by 25%  LUMBAR SPECIAL TESTS:  Single leg stance test: Negative, SI Compression/distraction test: Negative, and FABER test: Negative  FUNCTIONAL TESTS:  Functional squat completed but with decent decreased by ~45%  GAIT: WFL  POSTURE: rounded  shoulders and forward head  PELVIC ALIGNMENT: WFL  LUMBARAROM/PROM:  A/PROM A/PROM  eval  Flexion WFL  Extension WFL  Right lateral flexion Limited by 25%  Left lateral flexion Limited by 25%  Right rotation Limited by 25%  Left rotation Limited by 25%   (Blank rows = not tested)  LOWER EXTREMITY ROM:  WFL  LOWER EXTREMITY MMT:   Bil hips grossly 4/5, knees 5/5 PALPATION:   General  no TTP at abdomen, pubic bone, proximal adductors, does have tight bil piriformis and lumbar and thoracic paraspinals                External Perineal Exam rectal assessment completed - skin darkening around EAS, no pain or skin irritation noted                             Internal Pelvic Floor discomfort reported with palpation at anterior (12 on clock face) toward coccyx and increased tension throughout from 9-3 on clock face of EAS (tailbone 12).   Patient confirms identification and approves PT to assess internal pelvic floor  and treatment Yes No emotional/communication barriers or cognitive limitation. Patient is motivated to learn. Patient understands and agrees with treatment goals and plan. PT explains patient will be examined in standing, sitting, and lying down to see how their muscles and joints work. When they are ready, they will be asked to remove their underwear so PT can examine their perineum. The patient is also given the option of providing their own chaperone as one is not provided in our facility. The patient also has the right and is explained the right to defer or refuse any part of the evaluation or treatment including the internal exam. With the patient's consent, PT will use one gloved finger to gently assess the muscles of the pelvic floor, seeing how well it contracts and relaxes and if there is muscle symmetry. After, the patient will get dressed and PT and patient will discuss exam findings and plan of care. PT and patient discuss plan of care, schedule, attendance policy and  HEP activities.  PELVIC MMT:   MMT eval  Vaginal   Internal Anal Sphincter 4/5  External Anal Sphincter 5/5  Puborectalis 4/5  Diastasis Recti   (Blank rows = not tested)        TONE: Slightly increased  PROLAPSE: Not seen with rectal assessment  TODAY'S TREATMENT:                                                                                                                              DATE:   08/14/23 Manual: Trigger Point Dry-Needling  Treatment instructions: Expect mild to moderate muscle soreness. S/S of pneumothorax if dry needled over a lung field, and to seek immediate medical attention should they occur. Patient verbalized understanding of these instructions and education. Patient Consent Given: Yes Education handout provided: Previously provided Muscles treated: Lt pubococcygeus and Lt obturator internus Electrical stimulation performed: No Parameters: N/A Treatment response/outcome: twitch response/release Soft tissue mobilization to Bil glutes and Lt sacral/coccyx border Pt provides verbal consent for internal vaginal/rectal pelvic floor exam. Internal rectal pelvic floor muscle release in Lt side lying  Coccyx mobilization with external and internal rectal points of contact in Lt side lying Therapeutic activities: Postural correction: decreasing gluteal clenching and improving shoulder/rib cage/pelvis stacking to allow for improved core activation   08/03/23 Manual: Trigger Point Dry-Needling  Treatment instructions: Expect mild to moderate muscle soreness. S/S of pneumothorax if dry needled over a lung field, and to seek immediate medical attention should they occur. Patient verbalized understanding of these instructions and education.  Patient Consent Given: Yes Education handout provided: Yes Muscles treated: Bil glutes, piriformis, TFL Electrical stimulation performed: No Parameters: N/A Treatment response/outcome: twitch response/release Soft tissue  mobilization to Bil glutes and Lt sacral/coccyx border Neuromuscular re-education: Diaphragmatic breathing/pelvic floor muscle relaxation thinking of filling beach ball, allowing coccyx to move posteriorly Therapeutic activities: Awareness of gluteal tension/gripping behaviors   07/19/23 EVAL Examination completed, findings reviewed, pt educated on POC, HEP. Pt  motivated to participate in PT and agreeable to attempt recommendations.  Manual completed at rectal pelvic floor with gentle stretching and trigger point release throughout anterior (9-3 on clock face). Fair release completed but tension continued.    PATIENT EDUCATION:  Education details: SALES EXECUTIVE Person educated: Patient Education method: Solicitor, Actor cues, Verbal cues, and Handouts Education comprehension: verbalized understanding, returned demonstration, verbal cues required, tactile cues required, and needs further education  HOME EXERCISE PROGRAM: [id number]  ASSESSMENT:  CLINICAL IMPRESSION: Pt returned for dry needling again this session. Due to persistent coccydynia, we palpated Lt pelvic floor muscles rectally and located trigger points in pubococcygeus; dry needling performed to this area in two separate points along the muscle belly and in obturator internus which was very restriction. Excellent tolerance and release of soft tissue restriction palpated internally. We followed dry needling with manual release rectally and with coccyx mobilization to help reinforce improved muscle length and relaxation. Good tolerance to all manual techniques. We continued to discuss postural contribution to pelvic floor muscle tightness and worked on stacking core for improved abdominal activation and decreased gluteal bracing. Believe focus on deep core training will be beneficial with this as well. She will continue to benefit from skilled PT intervention in order to decrease pain and improve functional ability.    OBJECTIVE IMPAIRMENTS: decreased activity tolerance, decreased coordination, decreased endurance, decreased strength, increased fascial restrictions, increased muscle spasms, impaired flexibility, impaired tone, improper body mechanics, postural dysfunction, and pain.   ACTIVITY LIMITATIONS: sitting and continence  PARTICIPATION LIMITATIONS: community activity  PERSONAL FACTORS: Time since onset of injury/illness/exacerbation are also affecting patient's functional outcome.   REHAB POTENTIAL: Good  CLINICAL DECISION MAKING: Stable/uncomplicated  EVALUATION COMPLEXITY: Low   GOALS: Goals reviewed with patient? Yes  SHORT TERM GOALS: Target date: 08/16/23  Pt to be I with HEP.  Baseline: Goal status: INITIAL  2.  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  Baseline:  Goal status: INITIAL  3.  Pt to be I with relaxation techniques for improved tissue mobility at posterior pelvic floor and decreased pain.  Baseline:  Goal status: INITIAL  4.  Pt to be I with voiding mechanics to improved evacuation of bowels without pressure.  Baseline:  Goal status: INITIAL  LONG TERM GOALS: Target date: 11/17/23  Pt to be I with advanced HEP.  Baseline:  Goal status: INITIAL  2.  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  Baseline:  Goal status: INITIAL  3.  Pt to be demonstrate full ROM of trunk in all directions for decreased strain at pelvic floor.  Baseline:  Goal status: INITIAL  4.  Pt to demonstrate 5/5 hip strength for improved pelvis stability and decreased coccyx pain Baseline:  Goal status: INITIAL  5.  Pt to demonstrate improved core strength by tolerating good posture for at least 45 mins for decreased pelvic floor compensatory overuse and pain.  Baseline:  Goal status:  INITIAL   PLAN:  PT FREQUENCY: 1-2x/week  PT DURATION:  8 sessions  PLANNED INTERVENTIONS: 97110-Therapeutic exercises, 97530- Therapeutic activity, 97112- Neuromuscular re-education, 97535- Self Care, [id number]- Manual therapy, Patient/Family education, Taping, Dry Needling, Joint mobilization, Spinal mobilization, Scar mobilization, Cryotherapy, Moist heat, and Biofeedback  PLAN FOR NEXT SESSION: core and hip stretching breathing mechanics, internal for posterior  pelvic floor mobility, voiding mechanics, hip and core strengthening   Josette Mares, PT, DPT01/07/254:34 PM

## 2023-08-15 ENCOUNTER — Encounter: Payer: Self-pay | Admitting: Physical Therapy

## 2023-08-16 ENCOUNTER — Ambulatory Visit: Payer: Medicaid Other | Admitting: Physical Therapy

## 2023-08-20 ENCOUNTER — Ambulatory Visit: Payer: Medicaid Other | Admitting: Physical Therapy

## 2023-08-20 DIAGNOSIS — M62838 Other muscle spasm: Secondary | ICD-10-CM

## 2023-08-20 DIAGNOSIS — M6281 Muscle weakness (generalized): Secondary | ICD-10-CM

## 2023-08-20 DIAGNOSIS — R279 Unspecified lack of coordination: Secondary | ICD-10-CM

## 2023-08-20 NOTE — Therapy (Addendum)
 OUTPATIENT PHYSICAL THERAPY FEMALE PELVIC TREATMENT   Patient Name: [name] MRN: [id number] DOB:Sep 29, 1985, 38 y.o., female Today's Date: 08/21/2023  END OF SESSION:  PT End of Session - 08/20/23 1619     Visit Number 5    Date for PT Re-Evaluation 11/17/23    Authorization Type [place] MEDICAID [employer]    PT Start Time 1616    PT Stop Time 1700    PT Time Calculation (min) 44 min    Activity Tolerance Patient tolerated treatment well    Behavior During Therapy [facility] for tasks assessed/performed                Past Medical History:  Diagnosis Date   Anxiety    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 Formatting of this note might be different from the original.  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  0.25 cm carci   Depression    Family history of breast cancer    Family history of prostate cancer    Gestational diabetes 09/16/2019   Infertility, female    clomid    Migraines    Ovarian cyst    dermoid    PCOS (polycystic ovarian syndrome)    Stomach problems    (migraines or spasms) since age 64    Past Surgical History:  Procedure Laterality Date   COLONOSCOPY  2011   GALLBLADDER SURGERY     IUD REMOVAL  01/14/2019   LAPAROSCOPIC OVARIAN CYSTECTOMY Right 12/24/2013   Procedure: LAPAROSCOPIC OVARIAN CYSTECTOMY;  Surgeon: Randine VEAR Fender, MD;  Location: [facility];  Service: Gynecology;  Laterality: Right;   ROBOTIC ASSISTED LAPAROSCOPIC OVARIAN CYSTECTOMY Left 01/23/2019   Procedure: XI ROBOTIC ASSISTED LAPAROSCOPIC LEFT  OVARIAN CYSTECTOMY;  Surgeon: Eloy Herring, MD;  Location: [facility];  Service: Gynecology;  Laterality: Left;   ROBOTIC ASSISTED SALPINGO OOPHERECTOMY Right  01/23/2019   Procedure: XI ROBOTIC ASSISTED RIGHT SALPINGO OOPHORECTOMY;  Surgeon: Eloy Herring, MD;  Location: [facility];  Service: Gynecology;  Laterality: Right;   UPPER GI ENDOSCOPY  2011   WISDOM TOOTH EXTRACTION     Patient Active Problem List   Diagnosis Date Noted   Dermoid cyst of left ovary 11/17/2022   History of postpartum depression 08/22/2019   History of right salpingo-oophorectomy 07/11/2019   Family history of breast cancer    Family history of prostate cancer    Anxiety 03/06/2015   Female infertility 03/06/2015   Obesity, Class III, BMI 40-49.9 (morbid obesity) (HCC) 02/11/2014   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   Migraines    Depression     PCP: Rosina Bullock, PA-C  REFERRING PROVIDER: Cleotilde Ronal GORMAN, MD  REFERRING DIAG: [phone] (ICD-10-CM) - Pelvic floor dysfunction  THERAPY DIAG:  Muscle weakness (generalized) - Plan: PT plan of care cert/re-cert  Abnormal posture - Plan: PT plan of care cert/re-cert  Unspecified lack of coordination - Plan: PT plan of care cert/re-cert  Other muscle spasm - Plan: PT plan of care cert/re-cert  Rationale for Evaluation and Treatment: Rehabilitation  ONSET DATE: 5 months  SUBJECTIVE:  SUBJECTIVE STATEMENT: Tailbone pain about the same maybe a little better. Reading with son last night made it worse.   PAIN:  Are you having pain? Yes NPRS scale: 1/10 sitting normal, 8/10 leaning back seated (relaxed on soft surface) Pain location:  coccyx/low back  Pain type: sharp and throbbing, shooting Pain description: intermittent   Aggravating factors: sitting longer 40 mins esp on soft surface, go to standing from sitting after awhile Relieving factors: changing position, leaning forward  PRECAUTIONS: None  RED FLAGS: None   WEIGHT  BEARING RESTRICTIONS: No  FALLS:  Has patient fallen in last 6 months? 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   PLOF: Independent  PATIENT GOALS: to have less pain  PERTINENT HISTORY:  Anxiety, depression, PCOS, ovary removed Sexual abuse: No  BOWEL MOVEMENT: Pain with bowel movement: No Type of bowel movement:Type (Bristol Stool Scale) 2-4, Frequency daily , and Strain No Fully empty rectum: Yes:   Leakage: No Pads: No Fiber supplement: No  URINATION: Pain with urination: No Fully empty bladder: Yes:   Stream: Strong Urgency: No Frequency: not quicker than every 2 hours, 1-2x nightly Leakage:  jumping on trampoline while laughing Pads: No  INTERCOURSE: Pain with intercourse:  not painful Ability to have vaginal penetration:  Yes:   Climax: not painful Marinoff Scale: 0/3  PREGNANCY: Vaginal deliveries 3 Tearing Yes: episiotomy with first, small tearing with second stitching needed, micro tearing no stitches with third C-section deliveries 0 Currently pregnant No  PROLAPSE: Pressure vaginally while on period for the first couple days while on toilet    OBJECTIVE:  Note: Objective measures were completed at Evaluation unless otherwise noted.  DIAGNOSTIC FINDINGS:  No imaging   PATIENT SURVEYS:    [id number] 29  COGNITION: Overall cognitive status: Within functional limits for tasks assessed     SENSATION: Bil hamstrings and adductors limited by 25%  MUSCLE LENGTH: Bil hamstrings and adductors limited by 25%  LUMBAR SPECIAL TESTS:  Single leg stance test: Negative, SI Compression/distraction test: Negative, and FABER test: Negative  FUNCTIONAL TESTS:  Functional squat completed but with decent decreased by ~45%  GAIT: WFL  POSTURE: rounded shoulders and forward head  PELVIC ALIGNMENT: WFL  LUMBARAROM/PROM:  A/PROM A/PROM  eval  Flexion WFL   Extension WFL  Right lateral flexion Limited by 25%  Left lateral flexion Limited by 25%  Right rotation Limited by 25%  Left rotation Limited by 25%   (Blank rows = not tested)  LOWER EXTREMITY ROM:  WFL  LOWER EXTREMITY MMT:   Bil hips grossly 4/5, knees 5/5 PALPATION:   General  no TTP at abdomen, pubic bone, proximal adductors, does have tight bil piriformis and lumbar and thoracic paraspinals                External Perineal Exam rectal assessment completed - skin darkening around EAS, no pain or skin irritation noted                             Internal Pelvic Floor discomfort reported with palpation at anterior (12 on clock face) toward coccyx and increased tension throughout from 9-3 on clock face of EAS (tailbone 12).   Patient confirms identification and approves PT to assess internal pelvic floor and treatment Yes No emotional/communication barriers or cognitive limitation. Patient is motivated to learn. Patient understands and agrees with treatment goals  and plan. PT explains patient will be examined in standing, sitting, and lying down to see how their muscles and joints work. When they are ready, they will be asked to remove their underwear so PT can examine their perineum. The patient is also given the option of providing their own chaperone as one is not provided in our facility. The patient also has the right and is explained the right to defer or refuse any part of the evaluation or treatment including the internal exam. With the patient's consent, PT will use one gloved finger to gently assess the muscles of the pelvic floor, seeing how well it contracts and relaxes and if there is muscle symmetry. After, the patient will get dressed and PT and patient will discuss exam findings and plan of care. PT and patient discuss plan of care, schedule, attendance policy and HEP activities.  PELVIC MMT:   MMT eval  Vaginal   Internal Anal Sphincter 4/5  External Anal Sphincter  5/5  Puborectalis 4/5  Diastasis Recti   (Blank rows = not tested)        TONE: Slightly increased  PROLAPSE: Not seen with rectal assessment  TODAY'S TREATMENT:                                                                                                                              DATE:   08/20/23: Manual - Patient consented to internal pelvic floor treatment rectally this date and found to have continued tension over Lt pelvic floor and toward coccyx. Pt does endorse she felt tension with palpation here as well. Superficial layer pubococcygeus gentle stretching and soft tissue massage progressing with superficial and deeper layer with continued tension here as well however not at much as superficial layer. Pt tolerated well, did have good twitch at muscle layer with gentle overpressure for trigger point release and good release felt manually and by pt.  Throughout pt directed in diaphragmatic breathing and relaxation techniques including visualizations and top-down relaxations.   08/14/23 Manual: Trigger Point Dry-Needling  Treatment instructions: Expect mild to moderate muscle soreness. S/S of pneumothorax if dry needled over a lung field, and to seek immediate medical attention should they occur. Patient verbalized understanding of these instructions and education. Patient Consent Given: Yes Education handout provided: Previously provided Muscles treated: Lt pubococcygeus and Lt obturator internus Electrical stimulation performed: No Parameters: N/A Treatment response/outcome: twitch response/release Soft tissue mobilization to Bil glutes and Lt sacral/coccyx border Pt provides verbal consent for internal vaginal/rectal pelvic floor exam. Internal rectal pelvic floor muscle release in Lt side lying  Coccyx mobilization with external and internal rectal points of contact in Lt side lying Therapeutic activities: Postural correction: decreasing gluteal clenching and improving  shoulder/rib cage/pelvis stacking to allow for improved core activation   08/03/23 Manual: Trigger Point Dry-Needling  Treatment instructions: Expect mild to moderate muscle soreness. S/S of pneumothorax if dry needled over a lung field, and to seek immediate  medical attention should they occur. Patient verbalized understanding of these instructions and education.  Patient Consent Given: Yes Education handout provided: Yes Muscles treated: Bil glutes, piriformis, TFL Electrical stimulation performed: No Parameters: N/A Treatment response/outcome: twitch response/release Soft tissue mobilization to Bil glutes and Lt sacral/coccyx border Neuromuscular re-education: Diaphragmatic breathing/pelvic floor muscle relaxation thinking of filling beach ball, allowing coccyx to move posteriorly Therapeutic activities: Awareness of gluteal tension/gripping behaviors    PATIENT EDUCATION:  Education details: SALES EXECUTIVE Person educated: Patient Education method: Programmer, Multimedia, Demonstration, Actor cues, Verbal cues, and Handouts Education comprehension: verbalized understanding, returned demonstration, verbal cues required, tactile cues required, and needs further education  HOME EXERCISE PROGRAM: [id number]  ASSESSMENT:  CLINICAL IMPRESSION: Pt returned for treatment, tolerated well. Is still having pain but reports she does think mild improvement. Requested manual internally today to further attempt to release tension and pain at coccyx. Pt did have good release with manual work and pt denied pain at end of session. Pt would benefit from additional treatments for hip and core strengthening to further reduce pain. Pt  She will continue to benefit from skilled PT intervention in order to decrease pain and improve functional ability.   OBJECTIVE IMPAIRMENTS: decreased activity tolerance, decreased coordination, decreased endurance, decreased strength, increased fascial restrictions, increased muscle  spasms, impaired flexibility, impaired tone, improper body mechanics, postural dysfunction, and pain.   ACTIVITY LIMITATIONS: sitting and continence  PARTICIPATION LIMITATIONS: community activity  PERSONAL FACTORS: Time since onset of injury/illness/exacerbation are also affecting patient's functional outcome.   REHAB POTENTIAL: Good  CLINICAL DECISION MAKING: Stable/uncomplicated  EVALUATION COMPLEXITY: Low   GOALS: Goals reviewed with patient? Yes  SHORT TERM GOALS: Target date: 08/16/23  Pt to be I with HEP.  Baseline: Goal status: INITIAL  2.  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  Baseline:  Goal status: INITIAL  3.  Pt to be I with relaxation techniques for improved tissue mobility at posterior pelvic floor and decreased pain.  Baseline:  Goal status: INITIAL  4.  Pt to be I with voiding mechanics to improved evacuation of bowels without pressure.  Baseline:  Goal status: INITIAL  LONG TERM GOALS: Target date: 11/17/23  Pt to be I with advanced HEP.  Baseline:  Goal status: INITIAL  2.  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  Baseline:  Goal status: INITIAL  3.  Pt to be demonstrate full ROM of trunk in all directions for decreased strain at pelvic floor.  Baseline:  Goal status: INITIAL  4.  Pt to demonstrate 5/5 hip strength for improved pelvis stability and decreased coccyx pain Baseline:  Goal status: INITIAL  5.  Pt to demonstrate improved core strength by tolerating good posture for at least 45 mins for decreased pelvic floor compensatory overuse and pain.  Baseline:  Goal status: INITIAL   PLAN:  PT FREQUENCY: 1-2x/week  PT DURATION:  8 sessions  PLANNED INTERVENTIONS: 97110-Therapeutic exercises, 97530- Therapeutic activity, 97112- Neuromuscular  re-education, 97535- Self Care, [id number]- Manual therapy, Patient/Family education, Taping, Dry Needling, Joint mobilization, Spinal mobilization, Scar mobilization, Cryotherapy, Moist heat, and Biofeedback  PLAN FOR NEXT SESSION: core and hip stretching breathing mechanics, internal for posterior pelvic floor mobility, voiding mechanics, hip and core strengthening   Darryle Navy, PT, DPT

## 2023-08-29 ENCOUNTER — Ambulatory Visit (INDEPENDENT_AMBULATORY_CARE_PROVIDER_SITE_OTHER): Payer: Medicaid Other | Admitting: Physical Therapy

## 2023-08-29 DIAGNOSIS — M6281 Muscle weakness (generalized): Secondary | ICD-10-CM | POA: Diagnosis not present

## 2023-08-29 DIAGNOSIS — M62838 Other muscle spasm: Secondary | ICD-10-CM

## 2023-08-29 DIAGNOSIS — R279 Unspecified lack of coordination: Secondary | ICD-10-CM

## 2023-08-29 NOTE — Therapy (Signed)
OUTPATIENT PHYSICAL THERAPY FEMALE PELVIC TREATMENT   Patient Name: [name] MRN: [id number] DOB:1986/02/23, 38 y.o., female Today's Date: 08/29/2023  END OF SESSION:  PT End of Session - 08/29/23 0930     Visit Number 6    Date for PT Re-Evaluation 11/17/23    Authorization Type [place] MEDICAID [employer]    PT Start Time 0930    PT Stop Time 1011    PT Time Calculation (min) 41 min    Activity Tolerance Patient tolerated treatment well    Behavior During Therapy [facility] for tasks assessed/performed                Past Medical History:  Diagnosis Date   Anxiety    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 Formatting of this note might be different from the original.  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  0.25 cm carci   Depression    Family history of breast cancer    Family history of prostate cancer    Gestational diabetes 09/16/2019   Infertility, female    clomid   Migraines    Ovarian cyst    dermoid    PCOS (polycystic ovarian syndrome)    Stomach problems    (migraines or spasms) since age 80    Past Surgical History:  Procedure Laterality Date   COLONOSCOPY  2011   GALLBLADDER SURGERY     IUD REMOVAL  01/14/2019   LAPAROSCOPIC OVARIAN CYSTECTOMY Right 12/24/2013   Procedure: LAPAROSCOPIC OVARIAN CYSTECTOMY;  Surgeon: Bennye Alm, MD;  Location: [facility];  Service: Gynecology;  Laterality: Right;   ROBOTIC ASSISTED LAPAROSCOPIC OVARIAN CYSTECTOMY Left 01/23/2019   Procedure: XI ROBOTIC ASSISTED LAPAROSCOPIC LEFT  OVARIAN CYSTECTOMY;  Surgeon: Adolphus Birchwood, MD;  Location: [facility];  Service: Gynecology;  Laterality: Left;   ROBOTIC ASSISTED SALPINGO OOPHERECTOMY Right  01/23/2019   Procedure: XI ROBOTIC ASSISTED RIGHT SALPINGO OOPHORECTOMY;  Surgeon: Adolphus Birchwood, MD;  Location: [facility];  Service: Gynecology;  Laterality: Right;   UPPER GI ENDOSCOPY  2011   WISDOM TOOTH EXTRACTION     Patient Active Problem List   Diagnosis Date Noted   Dermoid cyst of left ovary 11/17/2022   History of postpartum depression 08/22/2019   History of right salpingo-oophorectomy 07/11/2019   Family history of breast cancer    Family history of prostate cancer    Anxiety 03/06/2015   Female infertility 03/06/2015   Obesity, Class III, BMI 40-49.9 (morbid obesity) (HCC) 02/11/2014   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   Migraines    Depression     PCP: Betsey Holiday, PA-C  REFERRING PROVIDER: Jerene Bears, MD  REFERRING DIAG: [phone] (ICD-10-CM) - Pelvic floor dysfunction  THERAPY DIAG:  Muscle weakness (generalized) - Plan: PT plan of care cert/re-cert  Abnormal posture - Plan: PT plan of care cert/re-cert  Unspecified lack of coordination - Plan: PT plan of care cert/re-cert  Other muscle spasm - Plan: PT plan of care cert/re-cert  Rationale for Evaluation and Treatment: Rehabilitation  ONSET DATE: 5 months  SUBJECTIVE:  SUBJECTIVE STATEMENT: Pain still present but not as severe and states "it's better but not great".   PAIN:  Are you having pain? Yes NPRS scale: 1/10  Pain location:  coccyx/low back  Pain type: sharp and throbbing, shooting Pain description: intermittent   Aggravating factors: sitting longer 40 mins esp on soft surface, go to standing from sitting after awhile Relieving factors: changing position, leaning forward  PRECAUTIONS: None  RED FLAGS: None   WEIGHT BEARING RESTRICTIONS: No  FALLS:  Has patient fallen in last 6 months? 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   PLOF: Independent  PATIENT GOALS: to have less pain  PERTINENT HISTORY:  Anxiety, depression, PCOS, ovary removed Sexual abuse: No  BOWEL MOVEMENT: Pain with bowel movement: No Type of bowel movement:Type (Bristol Stool Scale) 2-4, Frequency daily , and Strain No Fully empty rectum: Yes:   Leakage: No Pads: No Fiber supplement: No  URINATION: Pain with urination: No Fully empty bladder: Yes:   Stream: Strong Urgency: No Frequency: not quicker than every 2 hours, 1-2x nightly Leakage:  jumping on trampoline while laughing Pads: No  INTERCOURSE: Pain with intercourse:  not painful Ability to have vaginal penetration:  Yes:   Climax: not painful Marinoff Scale: 0/3  PREGNANCY: Vaginal deliveries 3 Tearing Yes: episiotomy with first, small tearing with second stitching needed, micro tearing no stitches with third C-section deliveries 0 Currently pregnant No  PROLAPSE: Pressure vaginally while on period for the first couple days while on toilet    OBJECTIVE:  Note: Objective measures were completed at Evaluation unless otherwise noted.  DIAGNOSTIC FINDINGS:  No imaging   PATIENT SURVEYS:    [id number] 29  COGNITION: Overall cognitive status: Within functional limits for tasks assessed     SENSATION: Bil hamstrings and adductors limited by 25%  MUSCLE LENGTH: Bil hamstrings and adductors limited by 25%  LUMBAR SPECIAL TESTS:  Single leg stance test: Negative, SI Compression/distraction test: Negative, and FABER test: Negative  FUNCTIONAL TESTS:  Functional squat completed but with decent decreased by ~45%  GAIT: WFL  POSTURE: rounded shoulders and forward head  PELVIC ALIGNMENT: WFL  LUMBARAROM/PROM:  A/PROM A/PROM  eval  Flexion WFL  Extension WFL  Right lateral flexion Limited by 25%  Left lateral flexion Limited by 25%   Right rotation Limited by 25%  Left rotation Limited by 25%   (Blank rows = not tested)  LOWER EXTREMITY ROM:  WFL  LOWER EXTREMITY MMT:   Bil hips grossly 4/5, knees 5/5 PALPATION:   General  no TTP at abdomen, pubic bone, proximal adductors, does have tight bil piriformis and lumbar and thoracic paraspinals                External Perineal Exam rectal assessment completed - skin darkening around EAS, no pain or skin irritation noted                             Internal Pelvic Floor discomfort reported with palpation at anterior (12 on clock face) toward coccyx and increased tension throughout from 9-3 on clock face of EAS (tailbone 12).   Patient confirms identification and approves PT to assess internal pelvic floor and treatment Yes No emotional/communication barriers or cognitive limitation. Patient is motivated to learn. Patient understands and agrees with treatment goals and plan. PT explains patient will be examined in standing, sitting, and  lying down to see how their muscles and joints work. When they are ready, they will be asked to remove their underwear so PT can examine their perineum. The patient is also given the option of providing their own chaperone as one is not provided in our facility. The patient also has the right and is explained the right to defer or refuse any part of the evaluation or treatment including the internal exam. With the patient's consent, PT will use one gloved finger to gently assess the muscles of the pelvic floor, seeing how well it contracts and relaxes and if there is muscle symmetry. After, the patient will get dressed and PT and patient will discuss exam findings and plan of care. PT and patient discuss plan of care, schedule, attendance policy and HEP activities.  PELVIC MMT:   MMT eval  Vaginal   Internal Anal Sphincter 4/5  External Anal Sphincter 5/5  Puborectalis 4/5  Diastasis Recti   (Blank rows = not tested)        TONE: Slightly  increased  PROLAPSE: Not seen with rectal assessment  TODAY'S TREATMENT:                                                                                                                              DATE:   08/29/23:  Bridges 2x10 blue band  Blue band bil shoulder horizontal abduction 2x10  Fire hydrants 2x10 each Donkey kicks 2x10 each Squats 10# 2x10 Farmers carry 20# 750' each hand Mario punches 5# 2x10  08/20/23: Manual - Patient consented to internal pelvic floor treatment rectally this date and found to have continued tension over Lt pelvic floor and toward coccyx. Pt does endorse she felt tension with palpation here as well. Superficial layer pubococcygeus gentle stretching and soft tissue massage progressing with superficial and deeper layer with continued tension here as well however not at much as superficial layer. Pt tolerated well, did have good twitch at muscle layer with gentle overpressure for trigger point release and good release felt manually and by pt.  Throughout pt directed in diaphragmatic breathing and relaxation techniques including visualizations and "top-down" relaxations.   08/14/23 Manual: Trigger Point Dry-Needling  Treatment instructions: Expect mild to moderate muscle soreness. S/S of pneumothorax if dry needled over a lung field, and to seek immediate medical attention should they occur. Patient verbalized understanding of these instructions and education. Patient Consent Given: Yes Education handout provided: Previously provided Muscles treated: Lt pubococcygeus and Lt obturator internus Electrical stimulation performed: No Parameters: N/A Treatment response/outcome: twitch response/release Soft tissue mobilization to Bil glutes and Lt sacral/coccyx border Pt provides verbal consent for internal vaginal/rectal pelvic floor exam. Internal rectal pelvic floor muscle release in Lt side lying  Coccyx mobilization with external and internal rectal points of  contact in Lt side lying Therapeutic activities: Postural correction: decreasing gluteal clenching and improving shoulder/rib cage/pelvis stacking to allow for improved core activation   08/03/23 Manual: Trigger  Point Dry-Needling  Treatment instructions: Expect mild to moderate muscle soreness. S/S of pneumothorax if dry needled over a lung field, and to seek immediate medical attention should they occur. Patient verbalized understanding of these instructions and education.  Patient Consent Given: Yes Education handout provided: Yes Muscles treated: Bil glutes, piriformis, TFL Electrical stimulation performed: No Parameters: N/A Treatment response/outcome: twitch response/release Soft tissue mobilization to Bil glutes and Lt sacral/coccyx border Neuromuscular re-education: Diaphragmatic breathing/pelvic floor muscle relaxation thinking of filling beach ball, allowing coccyx to move posteriorly Therapeutic activities: Awareness of gluteal tension/gripping behaviors    PATIENT EDUCATION:  Education details: Sales executive Person educated: Patient Education method: Programmer, multimedia, Demonstration, Actor cues, Verbal cues, and Handouts Education comprehension: verbalized understanding, returned demonstration, verbal cues required, tactile cues required, and needs further education  HOME EXERCISE PROGRAM: [id number]  ASSESSMENT:  CLINICAL IMPRESSION: Pt returned for treatment, tolerated well. Pt reports continued pain but improving. Pt tolerating treatment well, progressed session today with strengthening. Pt benefited from cues for techniques throughout and decreased compensatory strategies. She will continue to benefit from skilled PT intervention in order to decrease pain and improve functional ability.   OBJECTIVE IMPAIRMENTS: decreased activity tolerance, decreased coordination, decreased endurance, decreased strength, increased fascial restrictions, increased muscle spasms, impaired  flexibility, impaired tone, improper body mechanics, postural dysfunction, and pain.   ACTIVITY LIMITATIONS: sitting and continence  PARTICIPATION LIMITATIONS: community activity  PERSONAL FACTORS: Time since onset of injury/illness/exacerbation are also affecting patient's functional outcome.   REHAB POTENTIAL: Good  CLINICAL DECISION MAKING: Stable/uncomplicated  EVALUATION COMPLEXITY: Low   GOALS: Goals reviewed with patient? Yes  SHORT TERM GOALS: Target date: 08/16/23  Pt to be I with HEP.  Baseline: Goal status: INITIAL  2.  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  Baseline:  Goal status: INITIAL  3.  Pt to be I with relaxation techniques for improved tissue mobility at posterior pelvic floor and decreased pain.  Baseline:  Goal status: INITIAL  4.  Pt to be I with voiding mechanics to improved evacuation of bowels without pressure.  Baseline:  Goal status: INITIAL  LONG TERM GOALS: Target date: 11/17/23  Pt to be I with advanced HEP.  Baseline:  Goal status: INITIAL  2.  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  Baseline:  Goal status: INITIAL  3.  Pt to be demonstrate full ROM of trunk in all directions for decreased strain at pelvic floor.  Baseline:  Goal status: INITIAL  4.  Pt to demonstrate 5/5 hip strength for improved pelvis stability and decreased coccyx pain Baseline:  Goal status: INITIAL  5.  Pt to demonstrate improved core strength by tolerating good posture for at least 45 mins for decreased pelvic floor compensatory overuse and pain.  Baseline:  Goal status: INITIAL   PLAN:  PT FREQUENCY: 1-2x/week  PT DURATION:  8 sessions  PLANNED INTERVENTIONS: 97110-Therapeutic exercises, 97530- Therapeutic activity, 97112- Neuromuscular re-education,  97535- Self Care, 84132- Manual therapy, Patient/Family education, Taping, Dry Needling, Joint mobilization, Spinal mobilization, Scar mobilization, Cryotherapy, Moist heat, and Biofeedback  PLAN FOR NEXT SESSION: core and hip stretching breathing mechanics, internal for posterior pelvic floor mobility, voiding mechanics, hip and core strengthening   Otelia Sergeant, PT, DPT 08/28/2508:23 AM   [facility] [address] [place], [place] [address] Phone # [phone] Fax [phone]

## 2023-09-05 ENCOUNTER — Encounter: Payer: Self-pay | Admitting: Physical Therapy

## 2023-09-10 ENCOUNTER — Encounter: Payer: Self-pay | Admitting: Physical Therapy

## 2023-09-13 ENCOUNTER — Ambulatory Visit: Payer: Medicaid Other | Attending: Obstetrics & Gynecology | Admitting: Physical Therapy

## 2023-09-13 ENCOUNTER — Encounter: Payer: Self-pay | Admitting: Physical Therapy

## 2023-09-13 DIAGNOSIS — R293 Abnormal posture: Secondary | ICD-10-CM | POA: Diagnosis not present

## 2023-09-13 DIAGNOSIS — M6289 Other specified disorders of muscle: Secondary | ICD-10-CM | POA: Insufficient documentation

## 2023-09-13 DIAGNOSIS — M62838 Other muscle spasm: Secondary | ICD-10-CM | POA: Insufficient documentation

## 2023-09-13 DIAGNOSIS — M6281 Muscle weakness (generalized): Secondary | ICD-10-CM | POA: Diagnosis present

## 2023-09-13 DIAGNOSIS — R279 Unspecified lack of coordination: Secondary | ICD-10-CM | POA: Diagnosis not present

## 2023-09-13 NOTE — Therapy (Signed)
 OUTPATIENT PHYSICAL THERAPY FEMALE PELVIC TREATMENT   Patient Name: [name] MRN: [id number] DOB:October 14, 1985, 38 y.o., female Today's Date: 09/13/2023  END OF SESSION:  PT End of Session - 09/13/23 0936     Visit Number 7    Date for PT Re-Evaluation 11/17/23    Authorization Type [place] MEDICA[employer]    PT Start Time [phone]    PT Stop Time 1014    PT Time Calculation (min) 43 min    Activity Tolerance Patient tolerated treatment well    Behavior During Therapy [facility] for tasks assessed/performed                Past Medical History:  Diagnosis Date   Anxiety    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 Formatting of this note might be different from the original.  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  0.25 cm carci   Depression    Family history of breast cancer    Family history of prostate cancer    Gestational diabetes 09/16/2019   Infertility, female    clomid    Migraines    Ovarian cyst    dermoid    PCOS (polycystic ovarian syndrome)    Stomach problems    (migraines or spasms) since age 63    Past Surgical History:  Procedure Laterality Date   COLONOSCOPY  2011   GALLBLADDER SURGERY     IUD REMOVAL  01/14/2019   LAPAROSCOPIC OVARIAN CYSTECTOMY Right 12/24/2013   Procedure: LAPAROSCOPIC OVARIAN CYSTECTOMY;  Surgeon: Randine VEAR Fender, MD;  Location: [facility];  Service: Gynecology;  Laterality: Right;   ROBOTIC ASSISTED LAPAROSCOPIC OVARIAN CYSTECTOMY Left 01/23/2019   Procedure: XI ROBOTIC ASSISTED LAPAROSCOPIC LEFT  OVARIAN CYSTECTOMY;  Surgeon: Eloy Herring, MD;  Location: [facility];  Service: Gynecology;  Laterality: Left;   ROBOTIC ASSISTED SALPINGO OOPHERECTOMY Right  01/23/2019   Procedure: XI ROBOTIC ASSISTED RIGHT SALPINGO OOPHORECTOMY;  Surgeon: Eloy Herring, MD;  Location: [facility];  Service: Gynecology;  Laterality: Right;   UPPER GI ENDOSCOPY  2011   WISDOM TOOTH EXTRACTION     Patient Active Problem List   Diagnosis Date Noted   Dermoid cyst of left ovary 11/17/2022   History of postpartum depression 08/22/2019   History of right salpingo-oophorectomy 07/11/2019   Family history of breast cancer    Family history of prostate cancer    Anxiety 03/06/2015   Female infertility 03/06/2015   Obesity, Class III, BMI 40-49.9 (morbid obesity) (HCC) 02/11/2014   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   Migraines    Depression     PCP: Rosina Bullock, PA-C  REFERRING PROVIDER: Cleotilde Ronal GORMAN, MD  REFERRING DIAG: [phone] (ICD-10-CM) - Pelvic floor dysfunction  THERAPY DIAG:  Muscle weakness (generalized) - Plan: PT plan of care cert/re-cert  Abnormal posture - Plan: PT plan of care cert/re-cert  Unspecified lack of coordination - Plan: PT plan of care cert/re-cert  Other muscle spasm - Plan: PT plan of care cert/re-cert  Rationale for Evaluation and Treatment: Rehabilitation  ONSET DATE: 5 months  SUBJECTIVE:  SUBJECTIVE STATEMENT: Pain still present but not as severe and states it's better but not great.   PAIN:  Are you having pain? Yes NPRS scale: 1/10  Pain location:  coccyx/low back  Pain type: sharp and throbbing, shooting Pain description: intermittent   Aggravating factors: sitting longer 40 mins esp on soft surface, go to standing from sitting after awhile Relieving factors: changing position, leaning forward  PRECAUTIONS: None  RED FLAGS: None   WEIGHT BEARING RESTRICTIONS: No  FALLS:  Has patient fallen in last 6 months? 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   PLOF: Independent  PATIENT GOALS: to have less pain  PERTINENT HISTORY:  Anxiety, depression, PCOS, ovary removed Sexual abuse: No  BOWEL MOVEMENT: Pain with bowel movement: No Type of bowel movement:Type (Bristol Stool Scale) 2-4, Frequency daily , and Strain No Fully empty rectum: Yes:   Leakage: No Pads: No Fiber supplement: No  URINATION: Pain with urination: No Fully empty bladder: Yes:   Stream: Strong Urgency: No Frequency: not quicker than every 2 hours, 1-2x nightly Leakage:  jumping on trampoline while laughing Pads: No  INTERCOURSE: Pain with intercourse:  not painful Ability to have vaginal penetration:  Yes:   Climax: not painful Marinoff Scale: 0/3  PREGNANCY: Vaginal deliveries 3 Tearing Yes: episiotomy with first, small tearing with second stitching needed, micro tearing no stitches with third C-section deliveries 0 Currently pregnant No  PROLAPSE: Pressure vaginally while on period for the first couple days while on toilet    OBJECTIVE:  Note: Objective measures were completed at Evaluation unless otherwise noted.  DIAGNOSTIC FINDINGS:  No imaging   PATIENT SURVEYS:    PFIQ-7 29  COGNITION: Overall cognitive status: Within functional limits for tasks assessed     SENSATION: Bil hamstrings and adductors limited by 25%  MUSCLE LENGTH: Bil hamstrings and adductors limited by 25%  LUMBAR SPECIAL TESTS:  Single leg stance test: Negative, SI Compression/distraction test: Negative, and FABER test: Negative  FUNCTIONAL TESTS:  Functional squat completed but with decent decreased by ~45%  GAIT: WFL  POSTURE: rounded shoulders and forward head  PELVIC ALIGNMENT: WFL  LUMBARAROM/PROM:  A/PROM A/PROM  eval  Flexion WFL  Extension WFL  Right lateral flexion Limited by 25%  Left lateral flexion Limited by 25%   Right rotation Limited by 25%  Left rotation Limited by 25%   (Blank rows = not tested)  LOWER EXTREMITY ROM:  WFL  LOWER EXTREMITY MMT:   Bil hips grossly 4/5, knees 5/5 PALPATION:   General  no TTP at abdomen, pubic bone, proximal adductors, does have tight bil piriformis and lumbar and thoracic paraspinals                External Perineal Exam rectal assessment completed - skin darkening around EAS, no pain or skin irritation noted                             Internal Pelvic Floor discomfort reported with palpation at anterior (12 on clock face) toward coccyx and increased tension throughout from 9-3 on clock face of EAS (tailbone 12).   Patient confirms identification and approves PT to assess internal pelvic floor and treatment Yes No emotional/communication barriers or cognitive limitation. Patient is motivated to learn. Patient understands and agrees with treatment goals and plan. PT explains patient will be examined in standing, sitting, and  lying down to see how their muscles and joints work. When they are ready, they will be asked to remove their underwear so PT can examine their perineum. The patient is also given the option of providing their own chaperone as one is not provided in our facility. The patient also has the right and is explained the right to defer or refuse any part of the evaluation or treatment including the internal exam. With the patient's consent, PT will use one gloved finger to gently assess the muscles of the pelvic floor, seeing how well it contracts and relaxes and if there is muscle symmetry. After, the patient will get dressed and PT and patient will discuss exam findings and plan of care. PT and patient discuss plan of care, schedule, attendance policy and HEP activities.  PELVIC MMT:   MMT eval  Vaginal   Internal Anal Sphincter 4/5  External Anal Sphincter 5/5  Puborectalis 4/5  Diastasis Recti   (Blank rows = not tested)        TONE: Slightly  increased  PROLAPSE: Not seen with rectal assessment  TODAY'S TREATMENT:                                                                                                                              DATE:   09/13/23  Donkey kicks 2x10 each Fire hydrants 2x10 each Squats 2x10 7# hand weights each hand Mini lunges  x5 7# hand weights each hand Bird dogs x10 each - mod cues for transverse abdominis activation 7# shoulder height with alt marching 2x10 each Standing bent rows 7# 2x10 each - mod cues for limiting shoulder rounding  08/29/23:  Bridges 2x10 blue band  Blue band bil shoulder horizontal abduction 2x10  Fire hydrants 2x10 each Donkey kicks 2x10 each Squats 10# 2x10 Farmers carry 20# 750' each hand Mario punches 5# 2x10  08/20/23: Manual - Patient consented to internal pelvic floor treatment rectally this date and found to have continued tension over Lt pelvic floor and toward coccyx. Pt does endorse she felt tension with palpation here as well. Superficial layer pubococcygeus gentle stretching and soft tissue massage progressing with superficial and deeper layer with continued tension here as well however not at much as superficial layer. Pt tolerated well, did have good twitch at muscle layer with gentle overpressure for trigger point release and good release felt manually and by pt.  Throughout pt directed in diaphragmatic breathing and relaxation techniques including visualizations and top-down relaxations.   08/14/23 Manual: Trigger Point Dry-Needling  Treatment instructions: Expect mild to moderate muscle soreness. S/S of pneumothorax if dry needled over a lung field, and to seek immediate medical attention should they occur. Patient verbalized understanding of these instructions and education. Patient Consent Given: Yes Education handout provided: Previously provided Muscles treated: Lt pubococcygeus and Lt obturator internus Electrical stimulation performed:  No Parameters: N/A Treatment response/outcome: twitch response/release Soft tissue mobilization to Bil glutes and Lt sacral/coccyx  border Pt provides verbal consent for internal vaginal/rectal pelvic floor exam. Internal rectal pelvic floor muscle release in Lt side lying  Coccyx mobilization with external and internal rectal points of contact in Lt side lying Therapeutic activities: Postural correction: decreasing gluteal clenching and improving shoulder/rib cage/pelvis stacking to allow for improved core activation   PATIENT EDUCATION:  Education details: SALES EXECUTIVE Person educated: Patient Education method: Programmer, Multimedia, Facilities Manager, Actor cues, Verbal cues, and Handouts Education comprehension: verbalized understanding, returned demonstration, verbal cues required, tactile cues required, and needs further education  HOME EXERCISE PROGRAM: [id number]  ASSESSMENT:  CLINICAL IMPRESSION: Pt returned for treatment, tolerated well. Pt reports continued pain but improving however noted with sitting for prolonged time pain with increase to higher levels (in slouched posture). Pt tolerating treatment well, progressed session further today with strengthening. Pt benefited from cues for techniques throughout and decreased compensatory strategies, especially with core activation and for posture. Pt would benefit from additional strength training for posture control core and hips for improved ability to maintain good posture in sitting and standing for decreased strain at pelvic floor and pain at sacrum. Pt agreeable to see ortho PT for this to improve consistency in clinic for appointments and maximize her frequency for sessions.   OBJECTIVE IMPAIRMENTS: decreased activity tolerance, decreased coordination, decreased endurance, decreased strength, increased fascial restrictions, increased muscle spasms, impaired flexibility, impaired tone, improper body mechanics, postural dysfunction, and pain.    ACTIVITY LIMITATIONS: sitting and continence  PARTICIPATION LIMITATIONS: community activity  PERSONAL FACTORS: Time since onset of injury/illness/exacerbation are also affecting patient's functional outcome.   REHAB POTENTIAL: Good  CLINICAL DECISION MAKING: Stable/uncomplicated  EVALUATION COMPLEXITY: Low   GOALS: Goals reviewed with patient? Yes  SHORT TERM GOALS: Target date: 08/16/23  Pt to be I with HEP.  Baseline: Goal status: INITIAL  2.  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  Baseline:  Goal status: INITIAL  3.  Pt to be I with relaxation techniques for improved tissue mobility at posterior pelvic floor and decreased pain.  Baseline:  Goal status: INITIAL  4.  Pt to be I with voiding mechanics to improved evacuation of bowels without pressure.  Baseline:  Goal status: INITIAL  LONG TERM GOALS: Target date: 11/17/23  Pt to be I with advanced HEP.  Baseline:  Goal status: INITIAL  2.  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  Baseline:  Goal status: INITIAL  3.  Pt to be demonstrate full ROM of trunk in all directions for decreased strain at pelvic floor.  Baseline:  Goal status: INITIAL  4.  Pt to demonstrate 5/5 hip strength for improved pelvis stability and decreased coccyx pain Baseline:  Goal status: INITIAL  5.  Pt to demonstrate improved core strength by tolerating good posture for at least 45 mins for decreased pelvic floor compensatory overuse and pain.  Baseline:  Goal status: INITIAL   PLAN:  PT FREQUENCY: 1-2x/week  PT DURATION:  8 sessions  PLANNED INTERVENTIONS: 97110-Therapeutic exercises, 97530- Therapeutic activity, 97112- Neuromuscular re-education, 97535- Self Care, [id number]- Manual therapy, Patient/Family education, Taping, Dry  Needling, Joint mobilization, Spinal mobilization, Scar mobilization, Cryotherapy, Moist heat, and Biofeedback  PLAN FOR NEXT SESSION: core and hip stretching breathing mechanics, internal for posterior pelvic floor mobility, voiding mechanics, hip and core strengthening  Darryle Navy, PT, DPT 09/12/2508:54 AM   [facility] [address], [address] [place], [place] [address] Phone # [phone] Fax [phone]

## 2023-09-17 ENCOUNTER — Encounter: Payer: Self-pay | Admitting: Physical Therapy

## 2023-09-18 ENCOUNTER — Ambulatory Visit: Payer: Medicaid Other

## 2023-09-18 ENCOUNTER — Ambulatory Visit: Payer: Medicaid Other | Admitting: Physical Therapy

## 2023-09-18 DIAGNOSIS — M6281 Muscle weakness (generalized): Secondary | ICD-10-CM

## 2023-09-18 DIAGNOSIS — R262 Difficulty in walking, not elsewhere classified: Secondary | ICD-10-CM

## 2023-09-18 DIAGNOSIS — M62838 Other muscle spasm: Secondary | ICD-10-CM

## 2023-09-18 DIAGNOSIS — R293 Abnormal posture: Secondary | ICD-10-CM

## 2023-09-18 DIAGNOSIS — R252 Cramp and spasm: Secondary | ICD-10-CM

## 2023-09-18 NOTE — Therapy (Unsigned)
OUTPATIENT PHYSICAL THERAPY FEMALE PELVIC TREATMENT   Patient Name: [name] MRN: [id number] DOB:1985-09-27, 38 y.o., female Today's Date: 09/19/2023  END OF SESSION:  PT End of Session - 09/18/23 1614     Visit Number 8    Date for PT Re-Evaluation 11/17/23    Authorization Type [place] MEDICAID [employer]    PT Start Time 1615    PT Stop Time 1700    PT Time Calculation (min) 45 min    Activity Tolerance Patient tolerated treatment well    Behavior During Therapy [facility] for tasks assessed/performed                Past Medical History:  Diagnosis Date   Anxiety    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 Formatting of this note might be different from the original.  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  0.25 cm carci   Depression    Family history of breast cancer    Family history of prostate cancer    Gestational diabetes 09/16/2019   Infertility, female    clomid   Migraines    Ovarian cyst    dermoid    PCOS (polycystic ovarian syndrome)    Stomach problems    (migraines or spasms) since age 37    Past Surgical History:  Procedure Laterality Date   COLONOSCOPY  2011   GALLBLADDER SURGERY     IUD REMOVAL  01/14/2019   LAPAROSCOPIC OVARIAN CYSTECTOMY Right 12/24/2013   Procedure: LAPAROSCOPIC OVARIAN CYSTECTOMY;  Surgeon: Bennye Alm, MD;  Location: [facility];  Service: Gynecology;  Laterality: Right;   ROBOTIC ASSISTED LAPAROSCOPIC OVARIAN CYSTECTOMY Left 01/23/2019   Procedure: XI ROBOTIC ASSISTED LAPAROSCOPIC LEFT  OVARIAN CYSTECTOMY;  Surgeon: Adolphus Birchwood, MD;  Location: [facility];  Service: Gynecology;  Laterality: Left;   ROBOTIC ASSISTED SALPINGO OOPHERECTOMY Right  01/23/2019   Procedure: XI ROBOTIC ASSISTED RIGHT SALPINGO OOPHORECTOMY;  Surgeon: Adolphus Birchwood, MD;  Location: [facility];  Service: Gynecology;  Laterality: Right;   UPPER GI ENDOSCOPY  2011   WISDOM TOOTH EXTRACTION     Patient Active Problem List   Diagnosis Date Noted   Dermoid cyst of left ovary 11/17/2022   History of postpartum depression 08/22/2019   History of right salpingo-oophorectomy 07/11/2019   Family history of breast cancer    Family history of prostate cancer    Anxiety 03/06/2015   Female infertility 03/06/2015   Obesity, Class III, BMI 40-49.9 (morbid obesity) (HCC) 02/11/2014   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    PAIN:  09/18/23 Are you having pain? Yes NPRS scale: 1/10  Pain location:  coccyx/low back  Pain type: sharp and throbbing, shooting Pain description: intermittent   Aggravating factors: sitting longer 40 mins esp on soft surface, go to standing from sitting after awhile Relieving factors: changing position, leaning forward  PRECAUTIONS: None  RED FLAGS: None   WEIGHT BEARING RESTRICTIONS: No  FALLS:  Has patient fallen in last 6 months? 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   PLOF: Independent  PATIENT GOALS: to have less pain  PERTINENT HISTORY:  Anxiety, depression, PCOS, ovary removed Sexual abuse: No  BOWEL MOVEMENT: Pain with bowel movement: No Type of bowel movement:Type (Bristol Stool Scale) 2-4, Frequency daily , and Strain No Fully empty rectum: Yes:   Leakage: No Pads: No Fiber supplement: No  URINATION: Pain with urination: No Fully empty bladder: Yes:   Stream: Strong Urgency: No Frequency: not quicker than every 2 hours, 1-2x nightly Leakage:  jumping on trampoline while laughing Pads: No  INTERCOURSE: Pain with intercourse:  not painful Ability to have vaginal penetration:  Yes:   Climax: not painful Marinoff Scale: 0/3  PREGNANCY: Vaginal deliveries 3 Tearing Yes: episiotomy with first, small tearing with second stitching needed, micro tearing no stitches with third C-section deliveries 0 Currently pregnant No  PROLAPSE: Pressure vaginally while on period for the first couple days while on toilet    OBJECTIVE:  Note: Objective measures were completed at Evaluation unless otherwise noted.  DIAGNOSTIC FINDINGS:  No imaging   PATIENT SURVEYS:    PFIQ-7 29  COGNITION: Overall cognitive status: Within functional limits for tasks assessed     SENSATION: Bil hamstrings and adductors limited by 25%  MUSCLE LENGTH: Bil hamstrings and adductors limited by 25%  LUMBAR SPECIAL TESTS:  Single leg stance test: Negative, SI Compression/distraction test: Negative, and FABER test: Negative  FUNCTIONAL TESTS:  Functional squat completed but with decent decreased by ~45%  GAIT: WFL  POSTURE: rounded shoulders and forward head  PELVIC ALIGNMENT: WFL  LUMBARAROM/PROM:  A/PROM A/PROM  eval  Flexion WFL  Extension WFL  Right lateral flexion Limited by 25%  Left lateral flexion Limited by 25%  Right rotation Limited by 25%   Left rotation Limited by 25%   (Blank rows = not tested)  LOWER EXTREMITY ROM:  WFL  LOWER EXTREMITY MMT:   Bil hips grossly 4/5, knees 5/5 PALPATION:   General  no TTP at abdomen, pubic bone, proximal adductors, does have tight bil piriformis and lumbar and thoracic paraspinals                External Perineal Exam rectal assessment completed - skin darkening around EAS, no pain or skin irritation noted                             Internal Pelvic Floor discomfort reported with palpation at anterior (12 on clock face) toward coccyx and increased tension throughout from 9-3 on clock face of EAS (tailbone 12).   Patient confirms identification and approves PT to assess internal pelvic floor and treatment Yes No emotional/communication barriers or cognitive limitation. Patient is motivated to learn. Patient understands and agrees with treatment goals and plan. PT explains patient will be examined in standing, sitting, and lying down to see how their muscles  and joints work. When they are ready, they will be asked to remove their underwear so PT can examine their perineum. The patient is also given the option of providing their own chaperone as one is not provided in our facility. The patient also has the right and is explained the right to defer or refuse any part of the evaluation or treatment including the internal exam. With the patient's consent, PT will use one gloved finger to gently assess the muscles of the pelvic floor, seeing how well it contracts and relaxes and if there is muscle symmetry. After, the patient will get dressed and PT and patient will discuss exam findings and plan of care. PT and patient discuss plan of care, schedule, attendance policy and HEP activities.  PELVIC MMT:   MMT eval  Vaginal   Internal Anal Sphincter 4/5  External Anal Sphincter 5/5  Puborectalis 4/5  Diastasis Recti   (Blank rows = not tested)        TONE: Slightly increased  PROLAPSE: Not  seen with rectal assessment  TODAY'S TREATMENT:                                                                                                                              DATE:   09/18/23  Nustep x 5 min Donkey kicks 2x10 each Fire hydrants 2x10 each Side lying clam 2 x 10 with yellow loop Hook lying clam 2 x 10 with yellow loop  Squats 2x10 7# hand weights each hand Mini lunges  x5 7# hand weights each hand 7# shoulder height with alt marching 2x10 each Standing bent rows 7# 2x10 each - mod cues for limiting shoulder rounding    Bird dogs x10 each - mod cues for transverse abdominis activation Piriformis stretch 3 x 20 sec each LE Hooklying PPT with TA contraction x 20 Hooklying lower trunk rotation x 20  09/13/23  Donkey kicks 2x10 each Fire hydrants 2x10 each Squats 2x10 7# hand weights each hand Mini lunges  x5 7# hand weights each hand Bird dogs x10 each - mod cues for transverse abdominis activation 7# shoulder height with alt marching 2x10 each Standing bent rows 7# 2x10 each - mod cues for limiting shoulder rounding  08/29/23:  Bridges 2x10 blue band  Blue band bil shoulder horizontal abduction 2x10  Fire hydrants 2x10 each Donkey kicks 2x10 each Squats 10# 2x10 Farmers carry 20# 750' each hand Mario punches 5# 2x10  08/20/23: Manual - Patient consented to internal pelvic floor treatment rectally this date and found to have continued tension over Lt pelvic floor and toward coccyx. Pt does endorse she felt tension with palpation here as well. Superficial layer pubococcygeus gentle stretching and soft tissue massage progressing with superficial and deeper layer with continued tension here as well however not at much as superficial layer. Pt tolerated well, did have good twitch at muscle layer with gentle overpressure for trigger point release and good release felt  manually and by pt.  Throughout pt directed in diaphragmatic breathing and relaxation techniques including  visualizations and "top-down" relaxations.   08/14/23 Manual: Trigger Point Dry-Needling  Treatment instructions: Expect mild to moderate muscle soreness. S/S of pneumothorax if dry needled over a lung field, and to seek immediate medical attention should they occur. Patient verbalized understanding of these instructions and education. Patient Consent Given: Yes Education handout provided: Previously provided Muscles treated: Lt pubococcygeus and Lt obturator internus Electrical stimulation performed: No Parameters: N/A Treatment response/outcome: twitch response/release Soft tissue mobilization to Bil glutes and Lt sacral/coccyx border Pt provides verbal consent for internal vaginal/rectal pelvic floor exam. Internal rectal pelvic floor muscle release in Lt side lying  Coccyx mobilization with external and internal rectal points of contact in Lt side lying Therapeutic activities: Postural correction: decreasing gluteal clenching and improving shoulder/rib cage/pelvis stacking to allow for improved core activation   PATIENT EDUCATION:  Education details: Sales executive Person educated: Patient Education method: Programmer, multimedia, Facilities manager, Actor cues, Verbal cues, and Handouts Education comprehension: verbalized understanding, returned demonstration, verbal cues required, tactile cues required, and needs further education  HOME EXERCISE PROGRAM: [id number]  ASSESSMENT:  CLINICAL IMPRESSION: [name] was able to tolerate additional core strengthening and stretching without increase in coccyx pain.  She demonstrates ability to perform correct pelvic tilt.  She needed mod vc's for TA engagement.  She should continue to do well.  She is well motivated and compliant.  She would benefit from continuing skilled PT for hip and core strength along with flexibility.    OBJECTIVE IMPAIRMENTS: decreased activity tolerance, decreased coordination, decreased endurance, decreased strength, increased  fascial restrictions, increased muscle spasms, impaired flexibility, impaired tone, improper body mechanics, postural dysfunction, and pain.   ACTIVITY LIMITATIONS: sitting and continence  PARTICIPATION LIMITATIONS: community activity  PERSONAL FACTORS: Time since onset of injury/illness/exacerbation are also affecting patient's functional outcome.   REHAB POTENTIAL: Good  CLINICAL DECISION MAKING: Stable/uncomplicated  EVALUATION COMPLEXITY: Low   GOALS: Goals reviewed with patient? Yes  SHORT TERM GOALS: Target date: 08/16/23  Pt to be I with HEP.  Baseline: Goal status: INITIAL  2.  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  Baseline:  Goal status: INITIAL  3.  Pt to be I with relaxation techniques for improved tissue mobility at posterior pelvic floor and decreased pain.  Baseline:  Goal status: INITIAL  4.  Pt to be I with voiding mechanics to improved evacuation of bowels without pressure.  Baseline:  Goal status: INITIAL  LONG TERM GOALS: Target date: 11/17/23  Pt to be I with advanced HEP.  Baseline:  Goal status: INITIAL  2.  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  Baseline:  Goal status: INITIAL  3.  Pt to be demonstrate full ROM of trunk in all directions for decreased strain at pelvic floor.  Baseline:  Goal status: INITIAL  4.  Pt to demonstrate 5/5 hip strength for improved pelvis stability and decreased coccyx pain Baseline:  Goal status: INITIAL  5.  Pt to demonstrate improved core strength by tolerating good posture for at least 45 mins for decreased pelvic floor compensatory overuse and pain.  Baseline:  Goal status: INITIAL   PLAN:  PT FREQUENCY: 1-2x/week  PT DURATION:  8 sessions  PLANNED INTERVENTIONS: 97110-Therapeutic exercises, 97530-  Therapeutic activity, [id number]- Neuromuscular re-education, [phone]- Self Care, [id number]- Manual therapy, Patient/Family education, Taping, Dry Needling, Joint mobilization, Spinal mobilization, Scar mobilization, Cryotherapy, Moist heat, and Biofeedback  PLAN FOR NEXT SESSION: Continue core and hip stretching breathing mechanics, Re-assess ability to perform TA contraction, internal for posterior pelvic floor mobility, voiding mechanics, hip and core strengthening   Lakaisha Danish B. Brendi Mccarroll, PT 09/19/23 10:32 AM [facility] [address] [place], [place] [address] Phone # [phone] Fax [phone]

## 2023-09-19 ENCOUNTER — Ambulatory Visit: Payer: Medicaid Other | Admitting: Physical Therapy

## 2023-09-19 ENCOUNTER — Encounter: Payer: Self-pay | Admitting: Physical Therapy

## 2023-09-19 DIAGNOSIS — M6281 Muscle weakness (generalized): Secondary | ICD-10-CM | POA: Diagnosis not present

## 2023-09-19 DIAGNOSIS — R279 Unspecified lack of coordination: Secondary | ICD-10-CM

## 2023-09-19 DIAGNOSIS — M62838 Other muscle spasm: Secondary | ICD-10-CM

## 2023-09-19 NOTE — Therapy (Signed)
OUTPATIENT PHYSICAL THERAPY FEMALE PELVIC TREATMENT   Patient Name: [name] MRN: [id number] DOB:1986-06-20, 38 y.o., female Today's Date: 09/19/2023  END OF SESSION:  PT End of Session - 09/19/23 1620     Visit Number 9    Date for PT Re-Evaluation 11/17/23    Authorization Type [place] MEDICAID [employer]    PT Start Time 1619    PT Stop Time 1657    PT Time Calculation (min) 38 min    Activity Tolerance Patient tolerated treatment well    Behavior During Therapy [facility] for tasks assessed/performed                Past Medical History:  Diagnosis Date   Anxiety    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 Formatting of this note might be different from the original.  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  0.25 cm carci   Depression    Family history of breast cancer    Family history of prostate cancer    Gestational diabetes 09/16/2019   Infertility, female    clomid   Migraines    Ovarian cyst    dermoid    PCOS (polycystic ovarian syndrome)    Stomach problems    (migraines or spasms) since age 74    Past Surgical History:  Procedure Laterality Date   COLONOSCOPY  2011   GALLBLADDER SURGERY     IUD REMOVAL  01/14/2019   LAPAROSCOPIC OVARIAN CYSTECTOMY Right 12/24/2013   Procedure: LAPAROSCOPIC OVARIAN CYSTECTOMY;  Surgeon: Bennye Alm, MD;  Location: [facility];  Service: Gynecology;  Laterality: Right;   ROBOTIC ASSISTED LAPAROSCOPIC OVARIAN CYSTECTOMY Left 01/23/2019   Procedure: XI ROBOTIC ASSISTED LAPAROSCOPIC LEFT  OVARIAN CYSTECTOMY;  Surgeon: Adolphus Birchwood, MD;  Location: [facility];  Service: Gynecology;  Laterality: Left;   ROBOTIC ASSISTED SALPINGO OOPHERECTOMY Right  01/23/2019   Procedure: XI ROBOTIC ASSISTED RIGHT SALPINGO OOPHORECTOMY;  Surgeon: Adolphus Birchwood, MD;  Location: [facility];  Service: Gynecology;  Laterality: Right;   UPPER GI ENDOSCOPY  2011   WISDOM TOOTH EXTRACTION     Patient Active Problem List   Diagnosis Date Noted   Dermoid cyst of left ovary 11/17/2022   History of postpartum depression 08/22/2019   History of right salpingo-oophorectomy 07/11/2019   Family history of breast cancer    Family history of prostate cancer    Anxiety 03/06/2015   Female infertility 03/06/2015   Obesity, Class III, BMI 40-49.9 (morbid obesity) (HCC) 02/11/2014   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    PAIN:  09/18/23 Are you having pain? Yes NPRS scale: 1/10  Pain location:  coccyx/low back  Pain type: sharp and throbbing, shooting Pain description: intermittent   Aggravating factors: sitting longer 40 mins esp on soft surface, go to standing from sitting after awhile Relieving factors: changing position, leaning forward  PRECAUTIONS: None  RED FLAGS: None   WEIGHT BEARING RESTRICTIONS: No  FALLS:  Has patient fallen in last 6 months? 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   PLOF: Independent  PATIENT GOALS: to have less pain  PERTINENT HISTORY:  Anxiety, depression, PCOS, ovary removed Sexual abuse: No  BOWEL MOVEMENT: Pain with bowel movement: No Type of bowel movement:Type (Bristol Stool Scale) 2-4, Frequency daily , and Strain No Fully empty rectum: Yes:   Leakage: No Pads: No Fiber supplement: No  URINATION: Pain with urination: No Fully empty bladder: Yes:   Stream: Strong Urgency: No Frequency: not quicker than every 2 hours, 1-2x nightly Leakage:  jumping on trampoline while laughing Pads: No  INTERCOURSE: Pain with intercourse:  not painful Ability to have vaginal penetration:  Yes:   Climax: not painful Marinoff Scale: 0/3  PREGNANCY: Vaginal deliveries 3 Tearing Yes: episiotomy with first, small tearing with second stitching needed, micro tearing no stitches with third C-section deliveries 0 Currently pregnant No  PROLAPSE: Pressure vaginally while on period for the first couple days while on toilet    OBJECTIVE:  Note: Objective measures were completed at Evaluation unless otherwise noted.  DIAGNOSTIC FINDINGS:  No imaging   PATIENT SURVEYS:    PFIQ-7 29  COGNITION: Overall cognitive status: Within functional limits for tasks assessed     SENSATION: Bil hamstrings and adductors limited by 25%  MUSCLE LENGTH: Bil hamstrings and adductors limited by 25%  LUMBAR SPECIAL TESTS:  Single leg stance test: Negative, SI Compression/distraction test: Negative, and FABER test: Negative  FUNCTIONAL TESTS:  Functional squat completed but with decent decreased by ~45%  GAIT: WFL  POSTURE: rounded shoulders and forward head  PELVIC ALIGNMENT: WFL  LUMBARAROM/PROM:  A/PROM A/PROM  eval  Flexion WFL  Extension WFL  Right lateral flexion Limited by 25%  Left lateral flexion Limited by 25%  Right rotation Limited by 25%   Left rotation Limited by 25%   (Blank rows = not tested)  LOWER EXTREMITY ROM:  WFL  LOWER EXTREMITY MMT:   Bil hips grossly 4/5, knees 5/5 PALPATION:   General  no TTP at abdomen, pubic bone, proximal adductors, does have tight bil piriformis and lumbar and thoracic paraspinals                External Perineal Exam rectal assessment completed - skin darkening around EAS, no pain or skin irritation noted                             Internal Pelvic Floor discomfort reported with palpation at anterior (12 on clock face) toward coccyx and increased tension throughout from 9-3 on clock face of EAS (tailbone 12).   Patient confirms identification and approves PT to assess internal pelvic floor and treatment Yes No emotional/communication barriers or cognitive limitation. Patient is motivated to learn. Patient understands and agrees with treatment goals and plan. PT explains patient will be examined in standing, sitting, and lying down to see how their muscles  and joints work. When they are ready, they will be asked to remove their underwear so PT can examine their perineum. The patient is also given the option of providing their own chaperone as one is not provided in our facility. The patient also has the right and is explained the right to defer or refuse any part of the evaluation or treatment including the internal exam. With the patient's consent, PT will use one gloved finger to gently assess the muscles of the pelvic floor, seeing how well it contracts and relaxes and if there is muscle symmetry. After, the patient will get dressed and PT and patient will discuss exam findings and plan of care. PT and patient discuss plan of care, schedule, attendance policy and HEP activities.  PELVIC MMT:   MMT eval  Vaginal   Internal Anal Sphincter 4/5  External Anal Sphincter 5/5  Puborectalis 4/5  Diastasis Recti   (Blank rows = not tested)        TONE: Slightly increased  PROLAPSE: Not  seen with rectal assessment  TODAY'S TREATMENT:                                                                                                                              DATE:   09/19/23  Nustep x 6 min L5 Childs pose 3x30s Tail wags x10 Bird dogs 2x10 Standing bil shoulder horizontal abduction blue band 2x10 Bridges 2x10 + hip abduction red loop Sidelying reverse clams red loop 2x10 Squats 10+5# x10 switched hands x10 Standing marching 5+10# 2x10 each  09/18/23  Nustep x 5 min Donkey kicks 2x10 each Fire hydrants 2x10 each Side lying clam 2 x 10 with yellow loop Hook lying clam 2 x 10 with yellow loop  Squats 2x10 7# hand weights each hand Mini lunges  x5 7# hand weights each hand 7# shoulder height with alt marching 2x10 each Standing bent rows 7# 2x10 each - mod cues for limiting shoulder rounding    Bird dogs x10 each - mod cues for transverse abdominis activation Piriformis stretch 3 x 20 sec each LE Hooklying PPT with TA contraction x 20 Hooklying lower trunk rotation x 20  09/13/23  Donkey kicks 2x10 each Fire hydrants 2x10 each Squats 2x10 7# hand weights each hand Mini lunges  x5 7# hand weights each hand Bird dogs x10 each - mod cues for transverse abdominis activation 7# shoulder height with alt marching 2x10 each Standing bent rows 7# 2x10 each - mod cues for limiting shoulder rounding   PATIENT EDUCATION:  Education details: Sales executive Person educated: Patient Education method: Programmer, multimedia, Facilities manager, Actor cues, Verbal cues, and Handouts Education comprehension: verbalized understanding, returned demonstration, verbal cues required, tactile cues required, and needs further education  HOME EXERCISE PROGRAM: [id number]  ASSESSMENT:  CLINICAL IMPRESSION: Talley was able to tolerate additional core strengthening and stretching without increase in coccyx pain.  She continues to need mod vc's for TA engagement but tolerates more  mobility in quad and  standing without pain at coccyx pain.  She is well motivated and compliant.  She would benefit from continuing skilled PT for hip and core strength along with flexibility.    OBJECTIVE IMPAIRMENTS: decreased activity tolerance, decreased coordination, decreased endurance, decreased strength, increased fascial restrictions, increased muscle spasms, impaired flexibility, impaired tone, improper body mechanics, postural dysfunction, and pain.   ACTIVITY LIMITATIONS: sitting and continence  PARTICIPATION LIMITATIONS: community activity  PERSONAL FACTORS: Time since onset of injury/illness/exacerbation are also affecting patient's functional outcome.   REHAB POTENTIAL: Good  CLINICAL DECISION MAKING: Stable/uncomplicated  EVALUATION COMPLEXITY: Low   GOALS: Goals reviewed with patient? Yes  SHORT TERM GOALS: Target date: 08/16/23  Pt to be I with HEP.  Baseline: Goal status: INITIAL  2.  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  Baseline:  Goal status: INITIAL  3.  Pt to be I with relaxation techniques for improved tissue mobility at posterior pelvic floor and decreased pain.  Baseline:  Goal status: INITIAL  4.  Pt to be I with voiding mechanics to improved evacuation of bowels without pressure.  Baseline:  Goal status: INITIAL  LONG TERM GOALS: Target date: 11/17/23  Pt to be I with advanced HEP.  Baseline:  Goal status: INITIAL  2.  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  Baseline:  Goal status: INITIAL  3.  Pt to be demonstrate full ROM of trunk in all directions for decreased strain at pelvic floor.  Baseline:  Goal status: INITIAL  4.  Pt to demonstrate 5/5 hip strength for improved pelvis stability and decreased coccyx pain Baseline:  Goal status: INITIAL  5.  Pt to  demonstrate improved core strength by tolerating good posture for at least 45 mins for decreased pelvic floor compensatory overuse and pain.  Baseline:  Goal status: INITIAL   PLAN:  PT FREQUENCY: 1-2x/week  PT DURATION:  8 sessions  PLANNED INTERVENTIONS: 97110-Therapeutic exercises, 97530- Therapeutic activity, 97112- Neuromuscular re-education, 97535- Self Care, [id number]- Manual therapy, Patient/Family education, Taping, Dry Needling, Joint mobilization, Spinal mobilization, Scar mobilization, Cryotherapy, Moist heat, and Biofeedback  PLAN FOR NEXT SESSION: Continue core and hip stretching breathing mechanics, Re-assess ability to perform TA contraction, internal for posterior pelvic floor mobility, voiding mechanics, hip and core strengthening   Jennifer B. Fields, PT 09/19/23 5:01 PM [facility] [address], [address] [place], [place] [address] Phone # [phone] Fax [phone]

## 2023-09-25 ENCOUNTER — Ambulatory Visit: Payer: Medicaid Other | Admitting: Physical Therapy

## 2023-09-25 DIAGNOSIS — M6281 Muscle weakness (generalized): Secondary | ICD-10-CM

## 2023-09-25 DIAGNOSIS — R293 Abnormal posture: Secondary | ICD-10-CM

## 2023-09-25 DIAGNOSIS — M62838 Other muscle spasm: Secondary | ICD-10-CM

## 2023-09-25 NOTE — Therapy (Signed)
OUTPATIENT PHYSICAL THERAPY FEMALE PELVIC TREATMENT   Patient Name: [name] MRN: [id number] DOB:04/17/1986, 38 y.o., female Today's Date: 09/25/2023  END OF SESSION:  PT End of Session - 09/25/23 1447     Visit Number 10    Date for PT Re-Evaluation 11/17/23    Authorization Type [place] MEDICAID [employer]    PT Start Time 1445    PT Stop Time 1526    PT Time Calculation (min) 41 min    Activity Tolerance Patient tolerated treatment well    Behavior During Therapy [facility] for tasks assessed/performed                 Past Medical History:  Diagnosis Date   Anxiety    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 Formatting of this note might be different from the original.  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  0.25 cm carci   Depression    Family history of breast cancer    Family history of prostate cancer    Gestational diabetes 09/16/2019   Infertility, female    clomid   Migraines    Ovarian cyst    dermoid    PCOS (polycystic ovarian syndrome)    Stomach problems    (migraines or spasms) since age 13    Past Surgical History:  Procedure Laterality Date   COLONOSCOPY  2011   GALLBLADDER SURGERY     IUD REMOVAL  01/14/2019   LAPAROSCOPIC OVARIAN CYSTECTOMY Right 12/24/2013   Procedure: LAPAROSCOPIC OVARIAN CYSTECTOMY;  Surgeon: Bennye Alm, MD;  Location: [facility];  Service: Gynecology;  Laterality: Right;   ROBOTIC ASSISTED LAPAROSCOPIC OVARIAN CYSTECTOMY Left 01/23/2019   Procedure: XI ROBOTIC ASSISTED LAPAROSCOPIC LEFT  OVARIAN CYSTECTOMY;  Surgeon: Adolphus Birchwood, MD;  Location: [facility];  Service: Gynecology;  Laterality: Left;   ROBOTIC ASSISTED SALPINGO OOPHERECTOMY  Right 01/23/2019   Procedure: XI ROBOTIC ASSISTED RIGHT SALPINGO OOPHORECTOMY;  Surgeon: Adolphus Birchwood, MD;  Location: [facility];  Service: Gynecology;  Laterality: Right;   UPPER GI ENDOSCOPY  2011   WISDOM TOOTH EXTRACTION     Patient Active Problem List   Diagnosis Date Noted   Dermoid cyst of left ovary 11/17/2022   History of postpartum depression 08/22/2019   History of right salpingo-oophorectomy 07/11/2019   Family history of breast cancer    Family history of prostate cancer    Anxiety 03/06/2015   Female infertility 03/06/2015   Obesity, Class III, BMI 40-49.9 (morbid obesity) (HCC) 02/11/2014   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   Migraines    Depression     PCP: Betsey Holiday, PA-C  REFERRING PROVIDER: Jerene Bears, MD  REFERRING DIAG: [phone] (ICD-10-CM) - Pelvic floor dysfunction  THERAPY DIAG:  Muscle weakness (generalized) - Plan: PT plan of care cert/re-cert  Abnormal posture - Plan: PT plan of care cert/re-cert  Unspecified lack of coordination - Plan: PT plan of care cert/re-cert  Other muscle spasm - Plan: PT plan of care cert/re-cert  Rationale for Evaluation and Treatment: Rehabilitation  ONSET DATE: 5 months  SUBJECTIVE:  SUBJECTIVE STATEMENT: Pain still higher with slouched sitting but reports she has about 20 more mins before it starts  PAIN:  09/25/23 Are you having pain? Yes NPRS scale: 1/10  Pain location:  coccyx/low back  Pain type: sharp and throbbing, shooting Pain description: intermittent   Aggravating factors: sitting longer 40 mins esp on soft surface, go to standing from sitting after awhile Relieving factors: changing position, leaning forward  PRECAUTIONS: None  RED FLAGS: None   WEIGHT BEARING RESTRICTIONS: No  FALLS:  Has patient  fallen in last 6 months? 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   PLOF: Independent  PATIENT GOALS: to have less pain  PERTINENT HISTORY:  Anxiety, depression, PCOS, ovary removed Sexual abuse: No  BOWEL MOVEMENT: Pain with bowel movement: No Type of bowel movement:Type (Bristol Stool Scale) 2-4, Frequency daily , and Strain No Fully empty rectum: Yes:   Leakage: No Pads: No Fiber supplement: No  URINATION: Pain with urination: No Fully empty bladder: Yes:   Stream: Strong Urgency: No Frequency: not quicker than every 2 hours, 1-2x nightly Leakage:  jumping on trampoline while laughing Pads: No  INTERCOURSE: Pain with intercourse:  not painful Ability to have vaginal penetration:  Yes:   Climax: not painful Marinoff Scale: 0/3  PREGNANCY: Vaginal deliveries 3 Tearing Yes: episiotomy with first, small tearing with second stitching needed, micro tearing no stitches with third C-section deliveries 0 Currently pregnant No  PROLAPSE: Pressure vaginally while on period for the first couple days while on toilet    OBJECTIVE:  Note: Objective measures were completed at Evaluation unless otherwise noted.  DIAGNOSTIC FINDINGS:  No imaging   PATIENT SURVEYS:    PFIQ-7 29  COGNITION: Overall cognitive status: Within functional limits for tasks assessed     SENSATION: Bil hamstrings and adductors limited by 25%  MUSCLE LENGTH: Bil hamstrings and adductors limited by 25%  LUMBAR SPECIAL TESTS:  Single leg stance test: Negative, SI Compression/distraction test: Negative, and FABER test: Negative  FUNCTIONAL TESTS:  Functional squat completed but with decent decreased by ~45%  GAIT: WFL  POSTURE: rounded shoulders and forward head  PELVIC ALIGNMENT: WFL  LUMBARAROM/PROM:  A/PROM A/PROM  eval  Flexion WFL  Extension WFL  Right lateral flexion Limited by 25%   Left lateral flexion Limited by 25%  Right rotation Limited by 25%  Left rotation Limited by 25%   (Blank rows = not tested)  LOWER EXTREMITY ROM:  WFL  LOWER EXTREMITY MMT:   Bil hips grossly 4/5, knees 5/5 PALPATION:   General  no TTP at abdomen, pubic bone, proximal adductors, does have tight bil piriformis and lumbar and thoracic paraspinals                External Perineal Exam rectal assessment completed - skin darkening around EAS, no pain or skin irritation noted                             Internal Pelvic Floor discomfort reported with palpation at anterior (12 on clock face) toward coccyx and increased tension throughout from 9-3 on clock face of EAS (tailbone 12).   Patient confirms identification and approves PT to assess internal pelvic floor and treatment Yes No emotional/communication barriers or cognitive limitation. Patient is motivated to learn. Patient understands and agrees with treatment goals and plan. PT explains patient will be examined in  standing, sitting, and lying down to see how their muscles and joints work. When they are ready, they will be asked to remove their underwear so PT can examine their perineum. The patient is also given the option of providing their own chaperone as one is not provided in our facility. The patient also has the right and is explained the right to defer or refuse any part of the evaluation or treatment including the internal exam. With the patient's consent, PT will use one gloved finger to gently assess the muscles of the pelvic floor, seeing how well it contracts and relaxes and if there is muscle symmetry. After, the patient will get dressed and PT and patient will discuss exam findings and plan of care. PT and patient discuss plan of care, schedule, attendance policy and HEP activities.  PELVIC MMT:   MMT eval  Vaginal   Internal Anal Sphincter 4/5  External Anal Sphincter 5/5  Puborectalis 4/5  Diastasis Recti   (Blank rows =  not tested)        TONE: Slightly increased  PROLAPSE: Not seen with rectal assessment  TODAY'S TREATMENT:                                                                                                                              DATE:    09/25/23  Vibration plate standing, 2x103min sitting low setting Patient consented to internal pelvic floor assessment rectal this date and continued to have tension Lt superficial and deep layers and worse toward tailbone. Did have increased tension and TTP, tolerated manual well with STM. Coccyx mobs also completed P>A with restricted mobility noted but good relaese  09/19/23  Nustep x 6 min L5 Childs pose 3x30s Tail wags x10 Bird dogs 2x10 Standing bil shoulder horizontal abduction blue band 2x10 Bridges 2x10 + hip abduction red loop Sidelying reverse clams red loop 2x10 Squats 10+5# x10 switched hands x10 Standing marching 5+10# 2x10 each  09/18/23  Nustep x 5 min Donkey kicks 2x10 each Fire hydrants 2x10 each Side lying clam 2 x 10 with yellow loop Hook lying clam 2 x 10 with yellow loop  Squats 2x10 7# hand weights each hand Mini lunges  x5 7# hand weights each hand 7# shoulder height with alt marching 2x10 each Standing bent rows 7# 2x10 each - mod cues for limiting shoulder rounding    Bird dogs x10 each - mod cues for transverse abdominis activation Piriformis stretch 3 x 20 sec each LE Hooklying PPT with TA contraction x 20 Hooklying lower trunk rotation x 20    PATIENT EDUCATION:  Education details: Sales executive Person educated: Patient Education method: Programmer, multimedia, Facilities manager, Actor cues, Verbal cues, and Handouts Education comprehension: verbalized understanding, returned demonstration, verbal cues required, tactile cues required, and needs further education  HOME EXERCISE PROGRAM: [id number]  ASSESSMENT:  CLINICAL IMPRESSION: Pt report overall she has had improvement but does still have pain with slouching.  Session focused  on manual internally at rectum and good release noted of restrictions at tailbone and surrounding tissue. She is well motivated and compliant.  She would benefit from continuing skilled PT for hip and core strength along with flexibility.    OBJECTIVE IMPAIRMENTS: decreased activity tolerance, decreased coordination, decreased endurance, decreased strength, increased fascial restrictions, increased muscle spasms, impaired flexibility, impaired tone, improper body mechanics, postural dysfunction, and pain.   ACTIVITY LIMITATIONS: sitting and continence  PARTICIPATION LIMITATIONS: community activity  PERSONAL FACTORS: Time since onset of injury/illness/exacerbation are also affecting patient's functional outcome.   REHAB POTENTIAL: Good  CLINICAL DECISION MAKING: Stable/uncomplicated  EVALUATION COMPLEXITY: Low   GOALS: Goals reviewed with patient? Yes  SHORT TERM GOALS: Target date: 08/16/23  Pt to be I with HEP.  Baseline: Goal status: MET  2.  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  Baseline:  Goal status: MET  3.  Pt to be I with relaxation techniques for improved tissue mobility at posterior pelvic floor and decreased pain.  Baseline:  Goal status: MET  4.  Pt to be I with voiding mechanics to improved evacuation of bowels without pressure.  Baseline:  Goal status: MET  LONG TERM GOALS: Target date: 11/17/23  Pt to be I with advanced HEP.  Baseline:  Goal status: INITIAL  2.  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  Baseline:  Goal status: INITIAL  3.  Pt to be demonstrate full ROM of trunk in all directions for decreased strain at pelvic floor.  Baseline:  Goal status: INITIAL  4.  Pt to demonstrate 5/5 hip strength for improved pelvis stability and  decreased coccyx pain Baseline:  Goal status: INITIAL  5.  Pt to demonstrate improved core strength by tolerating good posture for at least 45 mins for decreased pelvic floor compensatory overuse and pain.  Baseline:  Goal status: INITIAL   PLAN:  PT FREQUENCY: 1-2x/week  PT DURATION:  8 sessions  PLANNED INTERVENTIONS: 97110-Therapeutic exercises, 97530- Therapeutic activity, 97112- Neuromuscular re-education, 97535- Self Care, 52841- Manual therapy, Patient/Family education, Taping, Dry Needling, Joint mobilization, Spinal mobilization, Scar mobilization, Cryotherapy, Moist heat, and Biofeedback  PLAN FOR NEXT SESSION: Continue core and hip stretching breathing mechanics, Re-assess ability to perform TA contraction, internal for posterior pelvic floor mobility, voiding mechanics, hip and core strengthening   Otelia Sergeant, PT, DPT 02/18/257:23 PM  [facility] [address], [address] [place], [place] [address] Phone # [phone] Fax [phone]

## 2023-09-28 ENCOUNTER — Encounter: Payer: Self-pay | Admitting: Physical Therapy

## 2023-09-28 ENCOUNTER — Ambulatory Visit: Payer: Medicaid Other | Admitting: Physical Therapy

## 2023-09-28 DIAGNOSIS — R293 Abnormal posture: Secondary | ICD-10-CM

## 2023-09-28 DIAGNOSIS — M62838 Other muscle spasm: Secondary | ICD-10-CM

## 2023-09-28 DIAGNOSIS — R262 Difficulty in walking, not elsewhere classified: Secondary | ICD-10-CM

## 2023-09-28 DIAGNOSIS — M6281 Muscle weakness (generalized): Secondary | ICD-10-CM

## 2023-09-28 DIAGNOSIS — R279 Unspecified lack of coordination: Secondary | ICD-10-CM

## 2023-09-28 DIAGNOSIS — R252 Cramp and spasm: Secondary | ICD-10-CM

## 2023-09-28 NOTE — Therapy (Signed)
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 Formatting of this note might be different from the original.  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  0.25 cm carci   Depression    Family history of breast cancer    Family history of prostate cancer    Gestational diabetes 09/16/2019   Infertility, female    clomid   Migraines    Ovarian cyst    dermoid    PCOS (polycystic ovarian syndrome)    Stomach problems    (migraines or spasms) since age 44    Past Surgical History:  Procedure Laterality Date   COLONOSCOPY  2011   GALLBLADDER SURGERY     IUD REMOVAL  01/14/2019   LAPAROSCOPIC OVARIAN CYSTECTOMY Right 12/24/2013   Procedure: LAPAROSCOPIC OVARIAN CYSTECTOMY;  Surgeon: Bennye Alm, MD;  Location: [facility];  Service: Gynecology;  Laterality: Right;   ROBOTIC ASSISTED LAPAROSCOPIC OVARIAN CYSTECTOMY Left 01/23/2019   Procedure: XI ROBOTIC ASSISTED LAPAROSCOPIC LEFT  OVARIAN CYSTECTOMY;  Surgeon: Adolphus Birchwood, MD;  Location: [facility];  Service: Gynecology;  Laterality: Left;   ROBOTIC ASSISTED SALPINGO OOPHERECTOMY  Right 01/23/2019   Procedure: XI ROBOTIC ASSISTED RIGHT SALPINGO OOPHORECTOMY;  Surgeon: Adolphus Birchwood, MD;  Location: [facility];  Service: Gynecology;  Laterality: Right;   UPPER GI ENDOSCOPY  2011   WISDOM TOOTH EXTRACTION     Patient Active Problem List   Diagnosis Date Noted   Dermoid cyst of left ovary 11/17/2022   History of postpartum depression 08/22/2019   History of right salpingo-oophorectomy 07/11/2019   Family history of breast cancer    Family history of prostate cancer    Anxiety 03/06/2015   Female infertility 03/06/2015   Obesity, Class III, BMI 40-49.9 (morbid obesity) (HCC) 02/11/2014   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   Migraines    Depression     PCP: Betsey Holiday, PA-C  REFERRING PROVIDER: Jerene Bears, MD  REFERRING DIAG: [phone] (ICD-10-CM) - Pelvic floor dysfunction  THERAPY DIAG:  Muscle weakness (generalized) - Plan: PT plan of care cert/re-cert  Abnormal posture - Plan: PT plan of care cert/re-cert  Unspecified lack of coordination - Plan: PT plan of care cert/re-cert  Other muscle spasm - Plan: PT plan of care cert/re-cert  Rationale for Evaluation and Treatment: Rehabilitation  ONSET DATE: 5 months  SUBJECTIVE:  SUBJECTIVE STATEMENT: It has felt much better since seeing Rolly Salter the other day.   PAIN:  09/25/23 Are you having pain? Yes NPRS scale: 2/10  Pain location:  coccyx/low back  Pain type: sharp and throbbing, shooting Pain description: intermittent   Aggravating factors: sitting longer 40 mins esp on soft surface, go to standing from sitting after awhile Relieving factors: changing position, leaning forward  PRECAUTIONS: None  RED FLAGS: None   WEIGHT BEARING RESTRICTIONS: No  FALLS:  Has patient fallen in last 6 months? 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   PLOF: Independent  PATIENT GOALS: to have less pain  PERTINENT HISTORY:  Anxiety, depression, PCOS, ovary removed Sexual abuse: No  BOWEL MOVEMENT: Pain with bowel movement: No Type of bowel movement:Type (Bristol Stool Scale) 2-4, Frequency daily , and Strain No Fully empty rectum: Yes:   Leakage: No Pads: No Fiber supplement: No  URINATION: Pain with urination: No Fully empty bladder: Yes:   Stream: Strong Urgency: No Frequency: not quicker than every 2 hours, 1-2x nightly Leakage:  jumping on trampoline while laughing Pads: No  INTERCOURSE: Pain with intercourse:  not painful Ability to have vaginal penetration:  Yes:   Climax: not painful Marinoff Scale: 0/3  PREGNANCY: Vaginal deliveries 3 Tearing Yes: episiotomy with first, small tearing with second stitching needed, micro tearing no stitches with third C-section deliveries 0 Currently pregnant No  PROLAPSE: Pressure vaginally while on period for the first couple days while on toilet    OBJECTIVE:  Note: Objective measures were completed at Evaluation unless otherwise noted.  DIAGNOSTIC FINDINGS:  No imaging   PATIENT SURVEYS:    PFIQ-7 29  COGNITION: Overall cognitive status: Within functional limits for tasks assessed     SENSATION: Bil hamstrings and adductors limited by 25%  MUSCLE LENGTH: Bil hamstrings and adductors limited by 25%  LUMBAR SPECIAL TESTS:  Single leg stance test: Negative, SI Compression/distraction test: Negative, and FABER test: Negative  FUNCTIONAL TESTS:  Functional squat completed but with decent decreased by ~45%  GAIT: WFL  POSTURE: rounded shoulders and forward head  PELVIC ALIGNMENT: WFL  LUMBARAROM/PROM:  A/PROM A/PROM  eval  Flexion WFL  Extension WFL  Right lateral flexion Limited by 25%  Left lateral flexion Limited by 25%   Right rotation Limited by 25%  Left rotation Limited by 25%   (Blank rows = not tested)  LOWER EXTREMITY ROM:  WFL  LOWER EXTREMITY MMT:   Bil hips grossly 4/5, knees 5/5 PALPATION:   General  no TTP at abdomen, pubic bone, proximal adductors, does have tight bil piriformis and lumbar and thoracic paraspinals                External Perineal Exam rectal assessment completed - skin darkening around EAS, no pain or skin irritation noted                             Internal Pelvic Floor discomfort reported with palpation at anterior (12 on clock face) toward coccyx and increased tension throughout from 9-3 on clock face of EAS (tailbone 12).   Patient confirms identification and approves PT to assess internal pelvic floor and treatment Yes No emotional/communication barriers or cognitive limitation. Patient is motivated to learn. Patient understands and agrees with treatment goals and plan. PT explains patient will be examined in standing, sitting, and lying down  to see how their muscles and joints work. When they are ready, they will be asked to remove their underwear so PT can examine their perineum. The patient is also given the option of providing their own chaperone as one is not provided in our facility. The patient also has the right and is explained the right to defer or refuse any part of the evaluation or treatment including the internal exam. With the patient's consent, PT will use one gloved finger to gently assess the muscles of the pelvic floor, seeing how well it contracts and relaxes and if there is muscle symmetry. After, the patient will get dressed and PT and patient will discuss exam findings and plan of care. PT and patient discuss plan of care, schedule, attendance policy and HEP activities.  PELVIC MMT:   MMT eval  Vaginal   Internal Anal Sphincter 4/5  External Anal Sphincter 5/5  Puborectalis 4/5  Diastasis Recti   (Blank rows = not tested)        TONE: Slightly  increased  PROLAPSE: Not seen with rectal assessment  TODAY'S TREATMENT:                                                                                                                              DATE:    09/28/23  Nustep x 5 min L5 Standing bil shoulder horizontal abduction blue band 2x10 with TA contraction Bridges 2x10 + hip abduction red loop Sidelying reverse clams red loop 2x10 Squats 10# ea 2 x 10 OH carry unilaterally 10# x 30 ft x 2 Standing marching holding unilaterally 20# 1x10 each Pallloff Blue x 10 B Deadlifts 15# x 15  09/25/23  Vibration plate standing, 2x101min sitting low setting Patient consented to internal pelvic floor assessment rectal this date and continued to have tension Lt superficial and deep layers and worse toward tailbone. Did have increased tension and TTP, tolerated manual well with STM. Coccyx mobs also completed P>A with restricted mobility noted but good relaese  09/19/23  Nustep x 6 min L5 Childs pose 3x30s Tail wags x10 Bird dogs 2x10 Standing bil shoulder horizontal abduction blue band 2x10 Bridges 2x10 + hip abduction red loop Sidelying reverse clams red loop 2x10 Squats 10+5# x10 switched hands x10 Standing marching 5+10# 2x10 each  09/18/23  Nustep x 5 min Donkey kicks 2x10 each Fire hydrants 2x10 each Side lying clam 2 x 10 with yellow loop Hook lying clam 2 x 10 with yellow loop  Squats 2x10 7# hand weights each hand Mini lunges  x5 7# hand weights each hand 7# shoulder height with alt marching 2x10 each Standing bent rows 7# 2x10 each - mod cues for limiting shoulder rounding    Bird dogs x10 each - mod cues for transverse abdominis activation Piriformis stretch 3 x 20 sec each LE Hooklying PPT with TA contraction x 20 Hooklying lower trunk rotation x 20    PATIENT EDUCATION:  Education  details: [id number] Person educated: Patient Education method: Explanation, Demonstration, Tactile cues, Verbal cues, and  Handouts Education comprehension: verbalized understanding, returned demonstration, verbal cues required, tactile cues required, and needs further education  HOME EXERCISE PROGRAM: [id number]  ASSESSMENT:  CLINICAL IMPRESSION: Patient continues to report pain with sitting on the couch. PT advised use of lumbar support when sitting to increase tolerance. Focused on core strength today. Patient tolerated all exercises well with no pain reported and was able to increase resistance on many. We discussed thera bands and resistance loops and how she could use these to make squats and dead lifts harder at home. Patient did not feel any benefit with Pallof exercise, so may need to do with pulleys next visit. Patient continues to demonstrate potential for improvement and would benefit from continued skilled therapy to address impairments.    OBJECTIVE IMPAIRMENTS: decreased activity tolerance, decreased coordination, decreased endurance, decreased strength, increased fascial restrictions, increased muscle spasms, impaired flexibility, impaired tone, improper body mechanics, postural dysfunction, and pain.   ACTIVITY LIMITATIONS: sitting and continence  PARTICIPATION LIMITATIONS: community activity  PERSONAL FACTORS: Time since onset of injury/illness/exacerbation are also affecting patient's functional outcome.   REHAB POTENTIAL: Good  CLINICAL DECISION MAKING: Stable/uncomplicated  EVALUATION COMPLEXITY: Low   GOALS: Goals reviewed with patient? Yes  SHORT TERM GOALS: Target date: 08/16/23  Pt to be I with HEP.  Baseline: Goal status: MET  2.  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  Baseline:  Goal status: MET  3.  Pt to be I with relaxation techniques for improved tissue mobility at posterior pelvic floor and decreased pain.  Baseline:  Goal status: MET  4.  Pt to be I with voiding  mechanics to improved evacuation of bowels without pressure.  Baseline:  Goal status: MET  LONG TERM GOALS: Target date: 11/17/23  Pt to be I with advanced HEP.  Baseline:  Goal status: INITIAL  2.  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  Baseline:  Goal status: INITIAL  3.  Pt to be demonstrate full ROM of trunk in all directions for decreased strain at pelvic floor.  Baseline:  Goal status: INITIAL  4.  Pt to demonstrate 5/5 hip strength for improved pelvis stability and decreased coccyx pain Baseline:  Goal status: INITIAL  5.  Pt to demonstrate improved core strength by tolerating good posture for at least 45 mins for decreased pelvic floor compensatory overuse and pain.  Baseline:  Goal status: INITIAL   PLAN:  PT FREQUENCY: 1-2x/week  PT DURATION:  8 sessions  PLANNED INTERVENTIONS: 97110-Therapeutic exercises, 97530- Therapeutic activity, 97112- Neuromuscular re-education, 97535- Self Care, 16109- Manual therapy, Patient/Family education, Taping, Dry Needling, Joint mobilization, Spinal mobilization, Scar mobilization, Cryotherapy, Moist heat, and Biofeedback  PLAN FOR NEXT SESSION: Continue core and hip stretching breathing mechanics, Re-assess ability to perform TA contraction, internal for posterior pelvic floor mobility, voiding mechanics, hip and core strengthening   Solon Palm, PT  09/27/2510:00 PM  [facility] [address] [place], [place] [address] Phone # [phone] Fax [phone]

## 2023-10-02 ENCOUNTER — Encounter (HOSPITAL_BASED_OUTPATIENT_CLINIC_OR_DEPARTMENT_OTHER): Payer: Self-pay | Admitting: Obstetrics & Gynecology

## 2023-10-03 ENCOUNTER — Encounter: Payer: Self-pay | Admitting: Physical Therapy

## 2023-10-03 ENCOUNTER — Ambulatory Visit: Payer: Medicaid Other | Admitting: Physical Therapy

## 2023-10-03 ENCOUNTER — Encounter: Payer: Medicaid Other | Admitting: Physical Therapy

## 2023-10-03 DIAGNOSIS — M6281 Muscle weakness (generalized): Secondary | ICD-10-CM | POA: Diagnosis not present

## 2023-10-03 DIAGNOSIS — R293 Abnormal posture: Secondary | ICD-10-CM

## 2023-10-03 NOTE — Therapy (Signed)
 OUTPATIENT PHYSICAL THERAPY FEMALE PELVIC TREATMENT   Patient Name: [name] MRN: [id number] DOB:Nov 19, 1985, 38 y.o., female Today's Date: 10/03/2023  END OF SESSION:  PT End of Session - 10/03/23 1543     Visit Number 12    Date for PT Re-Evaluation 11/17/23    Authorization Type [place] [employer]ICA[employer]    PT Start Time 1445    PT Stop Time 1530    PT Time Calculation (min) 45 min    Activity Tolerance Patient tolerated treatment well    Behavior During Therapy [facility] for tasks assessed/performed                   Past Medical History:  Diagnosis Date   Anxiety    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 Formatting of this note might be different from the original.  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  0.25 cm carci   Depression    Family history of breast cancer    Family history of prostate cancer    Gestational diabetes 09/16/2019   Infertility, female    clomid   Migraines    Ovarian cyst    dermoid    PCOS (polycystic ovarian syndrome)    Stomach problems    (migraines or spasms) since age 73    Past Surgical History:  Procedure Laterality Date   COLONOSCOPY  2011   GALLBLADDER SURGERY     IUD REMOVAL  01/14/2019   LAPAROSCOPIC OVARIAN CYSTECTOMY Right 12/24/2013   Procedure: LAPAROSCOPIC OVARIAN CYSTECTOMY;  Surgeon: Bennye Alm, MD;  Location: [facility];  Service: Gynecology;  Laterality: Right;   ROBOTIC ASSISTED LAPAROSCOPIC OVARIAN CYSTECTOMY Left 01/23/2019   Procedure: XI ROBOTIC ASSISTED LAPAROSCOPIC LEFT  OVARIAN CYSTECTOMY;  Surgeon: Adolphus Birchwood, MD;  Location: [facility];  Service: Gynecology;  Laterality: Left;   ROBOTIC ASSISTED SALPINGO  OOPHERECTOMY Right 01/23/2019   Procedure: XI ROBOTIC ASSISTED RIGHT SALPINGO OOPHORECTOMY;  Surgeon: Adolphus Birchwood, MD;  Location: [facility];  Service: Gynecology;  Laterality: Right;   UPPER GI ENDOSCOPY  2011   WISDOM TOOTH EXTRACTION     Patient Active Problem List   Diagnosis Date Noted   Dermoid cyst of left ovary 11/17/2022   History of postpartum depression 08/22/2019   History of right salpingo-oophorectomy 07/11/2019   Family history of breast cancer    Family history of prostate cancer    Anxiety 03/06/2015   Female infertility 03/06/2015   Obesity, Class III, BMI 40-49.9 (morbid obesity) (HCC) 02/11/2014   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   Migraines    Depression     PCP: Betsey Holiday, PA-C  REFERRING PROVIDER: Jerene Bears, MD  REFERRING DIAG: [phone] (ICD-10-CM) - Pelvic floor dysfunction  THERAPY DIAG:  Muscle weakness (generalized) - Plan: PT plan of care cert/re-cert  Abnormal posture - Plan: PT plan of care cert/re-cert  Unspecified lack of coordination - Plan: PT plan of care cert/re-cert  Other muscle spasm - Plan: PT plan of care cert/re-cert  Rationale for Evaluation and Treatment: Rehabilitation  ONSET DATE: 5 months  SUBJECTIVE:  SUBJECTIVE STATEMENT: Haley's treatment helped and it has continued to stay better since then.    PAIN:  09/25/23 Are you having pain? Yes NPRS scale: 2/10 but can get up to 7-8/10 Pain location:  coccyx/low back  Pain type: sharp and throbbing, shooting Pain description: intermittent   Aggravating factors: sitting longer 40 mins esp on soft surface, go to standing from sitting after awhile Relieving factors: changing position, leaning forward  PRECAUTIONS: None  RED FLAGS: None   WEIGHT BEARING RESTRICTIONS: No  FALLS:   Has patient fallen in last 6 months? 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   PLOF: Independent  PATIENT GOALS: to have less pain  PERTINENT HISTORY:  Anxiety, depression, PCOS, ovary removed Sexual abuse: No  BOWEL MOVEMENT: Pain with bowel movement: No Type of bowel movement:Type (Bristol Stool Scale) 2-4, Frequency daily , and Strain No Fully empty rectum: Yes:   Leakage: No Pads: No Fiber supplement: No  URINATION: Pain with urination: No Fully empty bladder: Yes:   Stream: Strong Urgency: No Frequency: not quicker than every 2 hours, 1-2x nightly Leakage:  jumping on trampoline while laughing Pads: No  INTERCOURSE: Pain with intercourse:  not painful Ability to have vaginal penetration:  Yes:   Climax: not painful Marinoff Scale: 0/3  PREGNANCY: Vaginal deliveries 3 Tearing Yes: episiotomy with first, small tearing with second stitching needed, micro tearing no stitches with third C-section deliveries 0 Currently pregnant No  PROLAPSE: Pressure vaginally while on period for the first couple days while on toilet    OBJECTIVE:  Note: Objective measures were completed at Evaluation unless otherwise noted.  DIAGNOSTIC FINDINGS:  No imaging   PATIENT SURVEYS:    PFIQ-7 29  COGNITION: Overall cognitive status: Within functional limits for tasks assessed     SENSATION: Bil hamstrings and adductors limited by 25%  MUSCLE LENGTH: Bil hamstrings and adductors limited by 25%  LUMBAR SPECIAL TESTS:  Single leg stance test: Negative, SI Compression/distraction test: Negative, and FABER test: Negative  FUNCTIONAL TESTS:  Functional squat completed but with decent decreased by ~45%  GAIT: WFL  POSTURE: rounded shoulders and forward head  PELVIC ALIGNMENT: WFL  LUMBARAROM/PROM:  A/PROM A/PROM  eval  Flexion WFL  Extension WFL  Right lateral flexion  Limited by 25%  Left lateral flexion Limited by 25%  Right rotation Limited by 25%  Left rotation Limited by 25%   (Blank rows = not tested)  LOWER EXTREMITY ROM:  WFL  LOWER EXTREMITY MMT:   Bil hips grossly 4/5, knees 5/5 PALPATION:   General  no TTP at abdomen, pubic bone, proximal adductors, does have tight bil piriformis and lumbar and thoracic paraspinals                External Perineal Exam rectal assessment completed - skin darkening around EAS, no pain or skin irritation noted                             Internal Pelvic Floor discomfort reported with palpation at anterior (12 on clock face) toward coccyx and increased tension throughout from 9-3 on clock face of EAS (tailbone 12).   Patient confirms identification and approves PT to assess internal pelvic floor and treatment Yes No emotional/communication barriers or cognitive limitation. Patient is motivated to learn. Patient understands and agrees with treatment goals and plan. PT explains patient will be  examined in standing, sitting, and lying down to see how their muscles and joints work. When they are ready, they will be asked to remove their underwear so PT can examine their perineum. The patient is also given the option of providing their own chaperone as one is not provided in our facility. The patient also has the right and is explained the right to defer or refuse any part of the evaluation or treatment including the internal exam. With the patient's consent, PT will use one gloved finger to gently assess the muscles of the pelvic floor, seeing how well it contracts and relaxes and if there is muscle symmetry. After, the patient will get dressed and PT and patient will discuss exam findings and plan of care. PT and patient discuss plan of care, schedule, attendance policy and HEP activities.  PELVIC MMT:   MMT eval  Vaginal   Internal Anal Sphincter 4/5  External Anal Sphincter 5/5  Puborectalis 4/5  Diastasis Recti    (Blank rows = not tested)        TONE: Slightly increased  PROLAPSE: Not seen with rectal assessment  TODAY'S TREATMENT:                                                                                                                              DATE:   10/03/23: Nustep x 6 min L5  Standing bil shoulder horizontal abduction blue band 2x10 with TA contraction  Pallloff Blue x 10 B Single shoulder extension blue tband with opp knee march to 90/90 x10 each side Squat to stand with bil shoulder extension blue (pull on standing phase) Wall plank on elbows - PT cued "leg your belly hang, then imagine grape in your belly button you are encircling and lifting toward your spine" 3 rounds to fatigue Quadruped TA release then lift 3x to fatigue Standing neuro re-ed: awareness of buttock gripping vs use of core, alignment of sternum over pubic bone, anterior ribcage over bil ASIS as a square with anterior support, pelvic floor imagery as the bottom of a paper grocery bag with a cantelope in it - let the cantelope relax and sag the bag, then lift it on an elevator ride (vs escalator coming forward with buttock gripping)  09/28/23  Nustep x 5 min L5 Standing bil shoulder horizontal abduction blue band 2x10 with TA contraction Bridges 2x10 + hip abduction red loop Sidelying reverse clams red loop 2x10 Squats 10# ea 2 x 10 OH carry unilaterally 10# x 30 ft x 2 Standing marching holding unilaterally 20# 1x10 each Pallloff Blue x 10 B Deadlifts 15# x 15  09/25/23  Vibration plate standing, 2x58min sitting low setting Patient consented to internal pelvic floor assessment rectal this date and continued to have tension Lt superficial and deep layers and worse toward tailbone. Did have increased tension and TTP, tolerated manual well with STM. Coccyx mobs also completed P>A with restricted mobility noted but good relaese  09/19/23  Nustep x 6 min L5 Childs pose 3x30s Tail wags x10 Bird dogs  2x10 Standing bil shoulder horizontal abduction blue band 2x10 Bridges 2x10 + hip abduction red loop Sidelying reverse clams red loop 2x10 Squats 10+5# x10 switched hands x10 Standing marching 5+10# 2x10 each  09/18/23  Nustep x 5 min Donkey kicks 2x10 each Fire hydrants 2x10 each Side lying clam 2 x 10 with yellow loop Hook lying clam 2 x 10 with yellow loop  Squats 2x10 7# hand weights each hand Mini lunges  x5 7# hand weights each hand 7# shoulder height with alt marching 2x10 each Standing bent rows 7# 2x10 each - mod cues for limiting shoulder rounding    Bird dogs x10 each - mod cues for transverse abdominis activation Piriformis stretch 3 x 20 sec each LE Hooklying PPT with TA contraction x 20 Hooklying lower trunk rotation x 20    PATIENT EDUCATION:  Education details: Sales executive Person educated: Patient Education method: Programmer, multimedia, Facilities manager, Actor cues, Verbal cues, and Handouts Education comprehension: verbalized understanding, returned demonstration, verbal cues required, tactile cues required, and needs further education  HOME EXERCISE PROGRAM: [id number]  ASSESSMENT:  CLINICAL IMPRESSION: Patient continues to report pain with sitting on the couch and soft surfaces.  Much of session today focused on finding ways to release and retrain awareness of isolation of TA and PF without overuse of buttock muscles.  Pt reports she got relief with last internal release session.  PT validated that she is on the right track today but that retraining patterns of postural support in static and dynamic positions takes conscious effort and time.    OBJECTIVE IMPAIRMENTS: decreased activity tolerance, decreased coordination, decreased endurance, decreased strength, increased fascial restrictions, increased muscle spasms, impaired flexibility, impaired tone, improper body mechanics, postural dysfunction, and pain.   ACTIVITY LIMITATIONS: sitting and continence  PARTICIPATION  LIMITATIONS: community activity  PERSONAL FACTORS: Time since onset of injury/illness/exacerbation are also affecting patient's functional outcome.   REHAB POTENTIAL: Good  CLINICAL DECISION MAKING: Stable/uncomplicated  EVALUATION COMPLEXITY: Low   GOALS: Goals reviewed with patient? Yes  SHORT TERM GOALS: Target date: 08/16/23  Pt to be I with HEP.  Baseline: Goal status: MET  2.  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  Baseline:  Goal status: MET  3.  Pt to be I with relaxation techniques for improved tissue mobility at posterior pelvic floor and decreased pain.  Baseline:  Goal status: MET  4.  Pt to be I with voiding mechanics to improved evacuation of bowels without pressure.  Baseline:  Goal status: MET  LONG TERM GOALS: Target date: 11/17/23  Pt to be I with advanced HEP.  Baseline:  Goal status: INITIAL  2.  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  Baseline:  Goal status: INITIAL  3.  Pt to be demonstrate full ROM of trunk in all directions for decreased strain at pelvic floor.  Baseline:  Goal status: INITIAL  4.  Pt to demonstrate 5/5 hip strength for improved pelvis stability and decreased coccyx pain Baseline:  Goal status: INITIAL  5.  Pt to demonstrate improved core strength by tolerating good posture for at least 45 mins for decreased pelvic floor compensatory overuse and pain.  Baseline:  Goal status: INITIAL  PLAN:  PT FREQUENCY: 1-2x/week  PT DURATION:  8 sessions  PLANNED INTERVENTIONS: 97110-Therapeutic exercises, 97530- Therapeutic activity, 97112- Neuromuscular re-education, 97535- Self Care, 40981- Manual therapy, Patient/Family education, Taping, Dry Needling, Joint mobilization, Spinal mobilization, Scar mobilization, Cryotherapy, Moist  heat, and Biofeedback  PLAN FOR NEXT SESSION: review cues from last session for postural alignment and core activation, Continue core and hip stretching breathing mechanics, Re-assess ability to perform TA contraction, internal for posterior pelvic floor mobility, voiding mechanics, hip and core strengthening   Morton Peters, PT 10/03/23 3:44 PM  [facility] [address], [address] [place], [place] [address] Phone # [phone] Fax [phone]

## 2023-10-05 ENCOUNTER — Telehealth (INDEPENDENT_AMBULATORY_CARE_PROVIDER_SITE_OTHER): Payer: Medicaid Other | Admitting: Obstetrics & Gynecology

## 2023-10-05 ENCOUNTER — Encounter (HOSPITAL_BASED_OUTPATIENT_CLINIC_OR_DEPARTMENT_OTHER): Payer: Self-pay | Admitting: Obstetrics & Gynecology

## 2023-10-05 DIAGNOSIS — E282 Polycystic ovarian syndrome: Secondary | ICD-10-CM | POA: Diagnosis not present

## 2023-10-05 DIAGNOSIS — R7989 Other specified abnormal findings of blood chemistry: Secondary | ICD-10-CM | POA: Diagnosis not present

## 2023-10-05 NOTE — Progress Notes (Signed)
 Virtual Visit via Video Note  I connected with [name] on 10/05/23 at 11:15 AM EST by a video enabled telemedicine application and verified that I am speaking with the correct person using two identifiers.  Location: Patient: home Provider: home   I discussed the limitations of evaluation and management by telemedicine and the availability of in person appointments. The patient expressed understanding and agreed to proceed.  History of Present Illness: 38 yo G3P3 MWF who recently had lab work done at [facility] that showed lower progesterone level and elevated DHEA.  She was prescribed progesterone cream.  She was given this to help with mood, sleep, immune function.  She used for a few days and had side effects.  Called back office and was advised this would help lower DHEA which she did not understand from the initial discussion about progesterone.  Is feeling unclear about reasoning for use.  Discussed I do not feel this will lower DHEA level.  Common side effects of progesterones discussed.  She also wanted to know reasons why more conventional provider, like me, would use progesterone therapy.  For now, she is going to hold off on using this.  We did discuss inositol use as well.  More conventional therapy of metformin with PCOS discussed for lowering insulin level.  She needed metformin during pregnancy and really would prefer to not be on a medication like that right now.  Questions answered.   Observations/Objective: WNWD, WF, NAD  Assessment and Plan: 1. PCOS (polycystic ovarian syndrome) (Primary)  2. Elevated DHEA - discussed recommendations made by integrative provider as pt desired second opinion - she is likely not going to restart progesterone   Follow Up Instructions: I discussed the assessment and treatment plan with the patient. The patient was provided an opportunity to ask questions and all were answered. The patient agreed with the plan and  demonstrated an understanding of the instructions.   The patient was advised to call back or seek an in-person evaluation if the symptoms worsen or if the condition fails to improve as anticipated.  I provided 28 minutes of non-face-to-face time during this encounter.   Jerene Bears, MD

## 2023-10-10 ENCOUNTER — Ambulatory Visit: Payer: Medicaid Other | Attending: Obstetrics & Gynecology | Admitting: Physical Therapy

## 2023-10-10 DIAGNOSIS — R293 Abnormal posture: Secondary | ICD-10-CM | POA: Diagnosis present

## 2023-10-10 DIAGNOSIS — M6281 Muscle weakness (generalized): Secondary | ICD-10-CM | POA: Diagnosis present

## 2023-10-10 DIAGNOSIS — M62838 Other muscle spasm: Secondary | ICD-10-CM | POA: Diagnosis present

## 2023-10-10 NOTE — Therapy (Signed)
 OUTPATIENT PHYSICAL THERAPY FEMALE PELVIC TREATMENT   Patient Name: [name] MRN: [id number] DOB:10-28-1985, 38 y.o., female Today's Date: 10/10/2023  END OF SESSION:  PT End of Session - 10/10/23 1449     Visit Number 13    Date for PT Re-Evaluation 11/17/23    Authorization Type [place] MEDICAID [employer]    PT Start Time 1448    PT Stop Time 1530    PT Time Calculation (min) 42 min    Activity Tolerance Patient tolerated treatment well    Behavior During Therapy [facility] for tasks assessed/performed              Past Medical History:  Diagnosis Date   Anxiety    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 Formatting of this note might be different from the original.  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  0.25 cm carci   Depression    Family history of breast cancer    Family history of prostate cancer    Gestational diabetes 09/16/2019   Infertility, female    clomid   Migraines    Ovarian cyst    dermoid    PCOS (polycystic ovarian syndrome)    Stomach problems    (migraines or spasms) since age 53    Past Surgical History:  Procedure Laterality Date   COLONOSCOPY  2011   GALLBLADDER SURGERY     IUD REMOVAL  01/14/2019   LAPAROSCOPIC OVARIAN CYSTECTOMY Right 12/24/2013   Procedure: LAPAROSCOPIC OVARIAN CYSTECTOMY;  Surgeon: Bennye Alm, MD;  Location: [facility];  Service: Gynecology;  Laterality: Right;   ROBOTIC ASSISTED LAPAROSCOPIC OVARIAN CYSTECTOMY Left 01/23/2019   Procedure: XI ROBOTIC ASSISTED LAPAROSCOPIC LEFT  OVARIAN CYSTECTOMY;  Surgeon: Adolphus Birchwood, MD;  Location: [facility];  Service: Gynecology;  Laterality: Left;   ROBOTIC ASSISTED SALPINGO OOPHERECTOMY Right  01/23/2019   Procedure: XI ROBOTIC ASSISTED RIGHT SALPINGO OOPHORECTOMY;  Surgeon: Adolphus Birchwood, MD;  Location: [facility];  Service: Gynecology;  Laterality: Right;   UPPER GI ENDOSCOPY  2011   WISDOM TOOTH EXTRACTION     Patient Active Problem List   Diagnosis Date Noted   Dermoid cyst of left ovary 11/17/2022   History of postpartum depression 08/22/2019   History of right salpingo-oophorectomy 07/11/2019   Family history of breast cancer    Family history of prostate cancer    Anxiety 03/06/2015   Female infertility 03/06/2015   Obesity, Class III, BMI 40-49.9 (morbid obesity) (HCC) 02/11/2014   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   Migraines    Depression     PCP: Betsey Holiday, PA-C  REFERRING PROVIDER: Jerene Bears, MD  REFERRING DIAG: [phone] (ICD-10-CM) - Pelvic floor dysfunction  THERAPY DIAG:  Muscle weakness (generalized) - Plan: PT plan of care cert/re-cert  Abnormal posture - Plan: PT plan of care cert/re-cert  Unspecified lack of coordination - Plan: PT plan of care cert/re-cert  Other muscle spasm - Plan: PT plan of care cert/re-cert  Rationale for Evaluation and Treatment: Rehabilitation  ONSET DATE: 5 months  SUBJECTIVE:  SUBJECTIVE STATEMENT: Felt better after internal for over a week and pain returned but to a lesser level (highest being 6/10) instead of 8/10. States she feels like she her pain is lower and less intense but starts a little quick with sitting.   PAIN:  10/10/23 Are you having pain? Yes NPRS scale: 1/10 but can get up to 5-6/10 Pain location:  coccyx/low back  Pain type: sharp and throbbing, shooting Pain description: intermittent   Aggravating factors: sitting longer 40 mins esp on soft surface, go to standing from sitting after awhile Relieving  factors: changing position, leaning forward  PRECAUTIONS: None  RED FLAGS: None   WEIGHT BEARING RESTRICTIONS: No  FALLS:  Has patient fallen in last 6 months? 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   PLOF: Independent  PATIENT GOALS: to have less pain  PERTINENT HISTORY:  Anxiety, depression, PCOS, ovary removed Sexual abuse: No  BOWEL MOVEMENT: Pain with bowel movement: No Type of bowel movement:Type (Bristol Stool Scale) 2-4, Frequency daily , and Strain No Fully empty rectum: Yes:   Leakage: No Pads: No Fiber supplement: No  URINATION: Pain with urination: No Fully empty bladder: Yes:   Stream: Strong Urgency: No Frequency: not quicker than every 2 hours, 1-2x nightly Leakage:  jumping on trampoline while laughing Pads: No  INTERCOURSE: Pain with intercourse:  not painful Ability to have vaginal penetration:  Yes:   Climax: not painful Marinoff Scale: 0/3  PREGNANCY: Vaginal deliveries 3 Tearing Yes: episiotomy with first, small tearing with second stitching needed, micro tearing no stitches with third C-section deliveries 0 Currently pregnant No  PROLAPSE: Pressure vaginally while on period for the first couple days while on toilet    OBJECTIVE:  Note: Objective measures were completed at Evaluation unless otherwise noted.  DIAGNOSTIC FINDINGS:  No imaging   PATIENT SURVEYS:    PFIQ-7 29  COGNITION: Overall cognitive status: Within functional limits for tasks assessed     SENSATION: Bil hamstrings and adductors limited by 25%  MUSCLE LENGTH: Bil hamstrings and adductors limited by 25%  LUMBAR SPECIAL TESTS:  Single leg stance test: Negative, SI Compression/distraction test: Negative, and FABER test: Negative  FUNCTIONAL TESTS:  Functional squat completed but with decent decreased by ~45%  GAIT: WFL  POSTURE: rounded shoulders and forward  head  PELVIC ALIGNMENT: WFL  LUMBARAROM/PROM:  A/PROM A/PROM  eval  Flexion WFL  Extension WFL  Right lateral flexion Limited by 25%  Left lateral flexion Limited by 25%  Right rotation Limited by 25%  Left rotation Limited by 25%   (Blank rows = not tested)  LOWER EXTREMITY ROM:  WFL  LOWER EXTREMITY MMT:   Bil hips grossly 4/5, knees 5/5 PALPATION:   General  no TTP at abdomen, pubic bone, proximal adductors, does have tight bil piriformis and lumbar and thoracic paraspinals                External Perineal Exam rectal assessment completed - skin darkening around EAS, no pain or skin irritation noted                             Internal Pelvic Floor discomfort reported with palpation at anterior (12 on clock face) toward coccyx and increased tension throughout from 9-3 on clock face of EAS (tailbone 12).   Patient confirms identification and approves PT to assess internal pelvic floor  and treatment Yes No emotional/communication barriers or cognitive limitation. Patient is motivated to learn. Patient understands and agrees with treatment goals and plan. PT explains patient will be examined in standing, sitting, and lying down to see how their muscles and joints work. When they are ready, they will be asked to remove their underwear so PT can examine their perineum. The patient is also given the option of providing their own chaperone as one is not provided in our facility. The patient also has the right and is explained the right to defer or refuse any part of the evaluation or treatment including the internal exam. With the patient's consent, PT will use one gloved finger to gently assess the muscles of the pelvic floor, seeing how well it contracts and relaxes and if there is muscle symmetry. After, the patient will get dressed and PT and patient will discuss exam findings and plan of care. PT and patient discuss plan of care, schedule, attendance policy and HEP activities.  PELVIC  MMT:   MMT eval  Vaginal   Internal Anal Sphincter 4/5  External Anal Sphincter 5/5  Puborectalis 4/5  Diastasis Recti   (Blank rows = not tested)        TONE: Slightly increased  PROLAPSE: Not seen with rectal assessment  TODAY'S TREATMENT:                                                                                                                              DATE:   10/10/23  Vibration plate 2x1 min sitting, x1 sitting with minimal sacral sitting as pt reports she feels tight this way and did report some tension after doing this with vibration on.  Manual in sidelying with most tension noted at Lt side of coccyx and moderate release noted  McConnell Tape used at tailbone for improved alignment and decreased pain with sitting - pt denied allergens and educated to remove with any skin irritation or redness/itching, and how to remove.   10/03/23: Nustep x 6 min L5  Standing bil shoulder horizontal abduction blue band 2x10 with TA contraction  Pallloff Blue x 10 B Single shoulder extension blue tband with opp knee march to 90/90 x10 each side Squat to stand with bil shoulder extension blue (pull on standing phase) Wall plank on elbows - PT cued "leg your belly hang, then imagine grape in your belly button you are encircling and lifting toward your spine" 3 rounds to fatigue Quadruped TA release then lift 3x to fatigue Standing neuro re-ed: awareness of buttock gripping vs use of core, alignment of sternum over pubic bone, anterior ribcage over bil ASIS as a square with anterior support, pelvic floor imagery as the bottom of a paper grocery bag with a cantelope in it - let the cantelope relax and sag the bag, then lift it on an elevator ride (vs escalator coming forward with buttock gripping)  09/28/23  Nustep x 5 min L5 Standing bil  shoulder horizontal abduction blue band 2x10 with TA contraction Bridges 2x10 + hip abduction red loop Sidelying reverse clams red loop  2x10 Squats 10# ea 2 x 10 OH carry unilaterally 10# x 30 ft x 2 Standing marching holding unilaterally 20# 1x10 each Pallloff Blue x 10 B Deadlifts 15# x 15  PATIENT EDUCATION:  Education details: Sales executive Person educated: Patient Education method: Programmer, multimedia, Facilities manager, Actor cues, Verbal cues, and Handouts Education comprehension: verbalized understanding, returned demonstration, verbal cues required, tactile cues required, and needs further education  HOME EXERCISE PROGRAM: [id number]  ASSESSMENT:  CLINICAL IMPRESSION: Pt report she has seen improvement but pain continues. Pt session focused on decreased tightness at coccyx at Lt side with manual, vibration and taping to improve coccyx alignment. Pt tolerated well did endorse tightness felt with tenderness with manual and reports she this is her tailbone pain. Pt had moderate release with this. Pt agrees to reassess internal pelvic floor next session and assess DC vs additional appointments.   OBJECTIVE IMPAIRMENTS: decreased activity tolerance, decreased coordination, decreased endurance, decreased strength, increased fascial restrictions, increased muscle spasms, impaired flexibility, impaired tone, improper body mechanics, postural dysfunction, and pain.   ACTIVITY LIMITATIONS: sitting and continence  PARTICIPATION LIMITATIONS: community activity  PERSONAL FACTORS: Time since onset of injury/illness/exacerbation are also affecting patient's functional outcome.   REHAB POTENTIAL: Good  CLINICAL DECISION MAKING: Stable/uncomplicated  EVALUATION COMPLEXITY: Low   GOALS: Goals reviewed with patient? Yes  SHORT TERM GOALS: Target date: 08/16/23  Pt to be I with HEP.  Baseline: Goal status: MET  2.  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  Baseline:  Goal status: MET  3.  Pt to be I with relaxation techniques for  improved tissue mobility at posterior pelvic floor and decreased pain.  Baseline:  Goal status: MET  4.  Pt to be I with voiding mechanics to improved evacuation of bowels without pressure.  Baseline:  Goal status: MET  LONG TERM GOALS: Target date: 11/17/23  Pt to be I with advanced HEP.  Baseline:  Goal status: on going  2.  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  Baseline:  Goal status: on going  3.  Pt to be demonstrate full ROM of trunk in all directions for decreased strain at pelvic floor.  Baseline:  Goal status: on going  4.  Pt to demonstrate 5/5 hip strength for improved pelvis stability and decreased coccyx pain Baseline:  Goal status: on going  5.  Pt to demonstrate improved core strength by tolerating good posture for at least 45 mins for decreased pelvic floor compensatory overuse and pain.  Baseline:  Goal status: on going   PLAN:  PT FREQUENCY: 1-2x/week  PT DURATION:  8 sessions  PLANNED INTERVENTIONS: 97110-Therapeutic exercises, 97530- Therapeutic activity, 97112- Neuromuscular re-education, 97535- Self Care, 16109- Manual therapy, Patient/Family education, Taping, Dry Needling, Joint mobilization, Spinal mobilization, Scar mobilization, Cryotherapy, Moist heat, and Biofeedback  PLAN FOR NEXT SESSION: review cues from last session for postural alignment and core activation, Continue core and hip stretching breathing mechanics, Re-assess ability to perform TA contraction, internal for posterior pelvic floor mobility  Otelia Sergeant, PT, DPT 03/05/254:23 PM   [facility] [address] [place], [place] [address] Phone # [phone] Fax [phone]

## 2023-10-16 ENCOUNTER — Encounter (HOSPITAL_BASED_OUTPATIENT_CLINIC_OR_DEPARTMENT_OTHER): Payer: Self-pay | Admitting: Obstetrics & Gynecology

## 2023-10-17 ENCOUNTER — Ambulatory Visit: Payer: Medicaid Other | Admitting: Physical Therapy

## 2023-10-17 ENCOUNTER — Encounter: Payer: Self-pay | Admitting: Physical Therapy

## 2023-10-17 DIAGNOSIS — R293 Abnormal posture: Secondary | ICD-10-CM

## 2023-10-17 DIAGNOSIS — M6281 Muscle weakness (generalized): Secondary | ICD-10-CM

## 2023-10-17 DIAGNOSIS — M62838 Other muscle spasm: Secondary | ICD-10-CM

## 2023-10-17 NOTE — Therapy (Addendum)
 OUTPATIENT PHYSICAL THERAPY FEMALE PELVIC TREATMENT   Patient Name: [name] MRN: [id number] DOB:[birth year], 38 y.o., female Today's Date: 10/17/2023  END OF SESSION:  PT End of Session - 10/17/23 1447     Visit Number 14    Date for PT Re-Evaluation 11/17/23    Authorization Type [place] MEDICAID [employer]    PT Start Time 1445    PT Stop Time 1528    PT Time Calculation (min) 43 min    Activity Tolerance Patient tolerated treatment well    Behavior During Therapy [facility] for tasks assessed/performed              Past Medical History:  Diagnosis Date   Anxiety    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 Formatting of this note might be different from the original.  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  0.25 cm carci   Depression    Family history of breast cancer    Family history of prostate cancer    Gestational diabetes 09/16/2019   Infertility, female    clomid    Migraines    Ovarian cyst    dermoid    PCOS (polycystic ovarian syndrome)    Stomach problems    (migraines or spasms) since age 82    Past Surgical History:  Procedure Laterality Date   COLONOSCOPY  2011   GALLBLADDER SURGERY     IUD REMOVAL  01/14/2019   LAPAROSCOPIC OVARIAN CYSTECTOMY Right 12/24/2013   Procedure: LAPAROSCOPIC OVARIAN CYSTECTOMY;  Surgeon: Randine VEAR Fender, MD;  Location: [facility];  Service: Gynecology;  Laterality: Right;   ROBOTIC ASSISTED LAPAROSCOPIC OVARIAN CYSTECTOMY Left 01/23/2019   Procedure: XI ROBOTIC ASSISTED LAPAROSCOPIC LEFT  OVARIAN CYSTECTOMY;  Surgeon: Eloy Herring, MD;  Location: [facility];  Service: Gynecology;  Laterality: Left;   ROBOTIC ASSISTED SALPINGO OOPHERECTOMY Right  01/23/2019   Procedure: XI ROBOTIC ASSISTED RIGHT SALPINGO OOPHORECTOMY;  Surgeon: Eloy Herring, MD;  Location: [facility];  Service: Gynecology;  Laterality: Right;   UPPER GI ENDOSCOPY  2011   WISDOM TOOTH EXTRACTION     Patient Active Problem List   Diagnosis Date Noted   Dermoid cyst of left ovary 11/17/2022   History of postpartum depression 08/22/2019   History of right salpingo-oophorectomy 07/11/2019   Family history of breast cancer    Family history of prostate cancer    Anxiety 03/06/2015   Female infertility 03/06/2015   Obesity, Class III, BMI 40-49.9 (morbid obesity) (HCC) 02/11/2014   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   Migraines    Depression     PCP: Rosina Bullock, PA-C  REFERRING PROVIDER: Cleotilde Ronal GORMAN, MD  REFERRING DIAG: [phone] (ICD-10-CM) - Pelvic floor dysfunction  THERAPY DIAG:  Muscle weakness (generalized) - Plan: PT plan of care cert/re-cert  Abnormal posture - Plan: PT plan of care cert/re-cert  Unspecified lack of coordination - Plan: PT plan of care cert/re-cert  Other muscle spasm - Plan: PT plan of care cert/re-cert  Rationale for Evaluation and Treatment: Rehabilitation  ONSET DATE: 5 months  SUBJECTIVE:  SUBJECTIVE STATEMENT: Pain has been better since last visit, overall much lower.   PAIN:   Are you having pain? Yes NPRS scale: 1/10 but can get up to 4-5/10 Pain location: coccyx/low back  Pain type: sharp and throbbing, shooting Pain description: intermittent   Aggravating factors: sitting longer 40 mins esp on soft surface, go to standing from sitting after awhile Relieving factors: changing position, leaning forward  PRECAUTIONS: None  RED FLAGS: None   WEIGHT BEARING RESTRICTIONS: No  FALLS:  Has patient fallen in last 6 months?  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   PLOF: Independent  PATIENT GOALS: to have less pain  PERTINENT HISTORY:  Anxiety, depression, PCOS, ovary removed Sexual abuse: No  BOWEL MOVEMENT: Pain with bowel movement: No Type of bowel movement:Type (Bristol Stool Scale) 2-4, Frequency daily , and Strain No Fully empty rectum: Yes:   Leakage: No Pads: No Fiber supplement: No  URINATION: Pain with urination: No Fully empty bladder: Yes:   Stream: Strong Urgency: No Frequency: not quicker than every 2 hours, 1-2x nightly Leakage: jumping on trampoline while laughing Pads: No  INTERCOURSE: Pain with intercourse: not painful Ability to have vaginal penetration:  Yes:   Climax: not painful Marinoff Scale: 0/3  PREGNANCY: Vaginal deliveries 3 Tearing Yes: episiotomy with first, small tearing with second stitching needed, micro tearing no stitches with third C-section deliveries 0 Currently pregnant No  PROLAPSE: Pressure vaginally while on period for the first couple days while on toilet    OBJECTIVE:  Note: Objective measures were completed at Evaluation unless otherwise noted.  DIAGNOSTIC FINDINGS:  No imaging   PATIENT SURVEYS:    PFIQ-7 29  COGNITION: Overall cognitive status: Within functional limits for tasks assessed     SENSATION: Bil hamstrings and adductors limited by 25%  MUSCLE LENGTH: Bil hamstrings and adductors limited by 25%  LUMBAR SPECIAL TESTS:  Single leg stance test: Negative, SI Compression/distraction test: Negative, and FABER test: Negative  FUNCTIONAL TESTS:  Functional squat completed but with decent decreased by ~45%  GAIT: WFL  POSTURE: rounded shoulders and forward head  PELVIC ALIGNMENT: WFL  LUMBARAROM/PROM:  A/PROM A/PROM  eval  Flexion WFL  Extension WFL  Right lateral flexion Limited by 25%  Left lateral flexion Limited  by 25%  Right rotation Limited by 25%  Left rotation Limited by 25%   (Blank rows = not tested)  LOWER EXTREMITY ROM:  WFL  LOWER EXTREMITY MMT:   Bil hips grossly 4/5, knees 5/5 PALPATION:   General  no TTP at abdomen, pubic bone, proximal adductors, does have tight bil piriformis and lumbar and thoracic paraspinals                External Perineal Exam rectal assessment completed - skin darkening around EAS, no pain or skin irritation noted                             Internal Pelvic Floor discomfort reported with palpation at anterior (12 on clock face) toward coccyx and increased tension throughout from 9-3 on clock face of EAS (tailbone 12).   Patient confirms identification and approves PT to assess internal pelvic floor and treatment Yes No emotional/communication barriers or cognitive limitation. Patient is motivated to learn. Patient understands and agrees with treatment goals and plan. PT explains patient will be examined in standing, sitting, and lying  down to see how their muscles and joints work. When they are ready, they will be asked to remove their underwear so PT can examine their perineum. The patient is also given the option of providing their own chaperone as one is not provided in our facility. The patient also has the right and is explained the right to defer or refuse any part of the evaluation or treatment including the internal exam. With the patient's consent, PT will use one gloved finger to gently assess the muscles of the pelvic floor, seeing how well it contracts and relaxes and if there is muscle symmetry. After, the patient will get dressed and PT and patient will discuss exam findings and plan of care. PT and patient discuss plan of care, schedule, attendance policy and HEP activities.  PELVIC MMT:   MMT eval  Vaginal   Internal Anal Sphincter 4/5  External Anal Sphincter 5/5  Puborectalis 4/5  Diastasis Recti   (Blank rows = not tested)         TONE: Slightly increased  PROLAPSE: Not seen with rectal assessment  TODAY'S TREATMENT:                                                                                                                              DATE:   10/17/23  Transverse abdominis activation 2x10 seated with tactile cues> ball squeezes with transverse abdominis activation  Manual Lt piriformis STM and Lt side of coccyx with notable tightness however greatly improved from last session.  3x30s piriformis stretch (2 in hooklying, 1 in sitting) Single knee to chest Lt 3x30s Educated to continue HEP, posture awareness with towel/pillow reminder at back to assist in reminder to sit more up right  10/10/23  Vibration plate 2x1 min sitting, x1 sitting with minimal sacral sitting as pt reports she feels tight this way and did report some tension after doing this with vibration on.  Manual in sidelying with most tension noted at Lt side of coccyx and moderate release noted  McConnell Tape used at tailbone for improved alignment and decreased pain with sitting - pt denied allergens and educated to remove with any skin irritation or redness/itching, and how to remove.   10/03/23: Nustep x 6 min L5  Standing bil shoulder horizontal abduction blue band 2x10 with TA contraction  Pallloff Blue x 10 B Single shoulder extension blue tband with opp knee march to 90/90 x10 each side Squat to stand with bil shoulder extension blue (pull on standing phase) Wall plank on elbows - PT cued leg your belly hang, then imagine grape in your belly button you are encircling and lifting toward your spine 3 rounds to fatigue Quadruped TA release then lift 3x to fatigue Standing neuro re-ed: awareness of buttock gripping vs use of core, alignment of sternum over pubic bone, anterior ribcage over bil ASIS as a square with anterior support, pelvic floor imagery as the bottom of a paper grocery bag  with a cantelope in it - let the cantelope relax and  sag the bag, then lift it on an elevator ride (vs escalator coming forward with buttock gripping)   PATIENT EDUCATION:  Education details: Sales executive Person educated: Patient Education method: Explanation, Demonstration, Tactile cues, Verbal cues, and Handouts Education comprehension: verbalized understanding, returned demonstration, verbal cues required, tactile cues required, and needs further education  HOME EXERCISE PROGRAM: [id number]  ASSESSMENT:  CLINICAL IMPRESSION: Pt reports she feels she has had most release this past week since starting PT. Reports her pain levels were never higher than 5/10 and able to sit during movies/reading to children. Pt session focused on decreased tightness at coccyx at Lt side with manual. Pt tolerated well did endorse feeling better overall. With pt having good results and improvement with symptoms this past week, pt would benefit from additional PT visits to isolate this external tightness with more localized pain and tolerating manual well. Pt agreeable to 4 more visits to further low pain and improve tolerance to sitting, strengthen core and hips for decreased strain at pelvic floor.   OBJECTIVE IMPAIRMENTS: decreased activity tolerance, decreased coordination, decreased endurance, decreased strength, increased fascial restrictions, increased muscle spasms, impaired flexibility, impaired tone, improper body mechanics, postural dysfunction, and pain.   ACTIVITY LIMITATIONS: sitting and continence  PARTICIPATION LIMITATIONS: community activity  PERSONAL FACTORS: Time since onset of injury/illness/exacerbation are also affecting patient's functional outcome.   REHAB POTENTIAL: Good  CLINICAL DECISION MAKING: Stable/uncomplicated  EVALUATION COMPLEXITY: Low   GOALS: Goals reviewed with patient? Yes  SHORT TERM GOALS: Target date: 08/16/23  Pt to be I with HEP.  Baseline: Goal status: MET  2.  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  Baseline:  Goal status: MET  3.  Pt to be I with relaxation techniques for improved tissue mobility at posterior pelvic floor and decreased pain.  Baseline:  Goal status: MET  4.  Pt to be I with voiding mechanics to improved evacuation of bowels without pressure.  Baseline:  Goal status: MET  LONG TERM GOALS: Target date: 11/17/23  Pt to be I with advanced HEP.  Baseline:  Goal status: on going  2.  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  Baseline:  Goal status: on going  3.  Pt to be demonstrate full ROM of trunk in all directions for decreased strain at pelvic floor.  Baseline:  Goal status: on going  4.  Pt to demonstrate 5/5 hip strength for improved pelvis stability and decreased coccyx pain Baseline:  Goal status: on going  5.  Pt to demonstrate improved core strength by tolerating good posture for at least 45 mins for decreased pelvic floor compensatory overuse and pain.  Baseline:  Goal status: on going   PLAN:  PT FREQUENCY: 1-2x/week  PT DURATION: 8 sessions  PLANNED INTERVENTIONS: 97110-Therapeutic exercises, 97530- Therapeutic activity, 97112- Neuromuscular re-education, 97535- Self Care, 02859- Manual therapy, Patient/Family education, Taping, Dry Needling, Joint mobilization, Spinal mobilization, Scar mobilization, Cryotherapy, Moist heat, and Biofeedback  PLAN FOR NEXT SESSION: Continue core and hip stretching breathing mechanics, external for posterior pelvic floor mobility  Darryle Navy, PT, DPT 03/12/253:47 PM   [facility] [address] [place], [place] [address] Phone # [phone] Fax [phone]   PHYSICAL  THERAPY DISCHARGE SUMMARY  Visits from Start of Care: 14  Current functional level related to goals /  functional outcomes: Unknown   Remaining deficits: See above   Education / Equipment: HEP   Patient agrees to discharge. Patient goals were partially met. Patient is being discharged due to not returning since the last visit.  Josette Mares, PT, DPT08/11/2508:27 AM

## 2023-11-14 ENCOUNTER — Encounter (HOSPITAL_BASED_OUTPATIENT_CLINIC_OR_DEPARTMENT_OTHER): Payer: Self-pay | Admitting: Obstetrics & Gynecology

## 2023-11-14 DIAGNOSIS — E282 Polycystic ovarian syndrome: Secondary | ICD-10-CM

## 2023-11-14 DIAGNOSIS — D271 Benign neoplasm of left ovary: Secondary | ICD-10-CM

## 2023-11-15 NOTE — Telephone Encounter (Signed)
 Called pt in response to [employer]. Pt provided with appt for ultrasound

## 2023-12-05 ENCOUNTER — Ambulatory Visit (HOSPITAL_BASED_OUTPATIENT_CLINIC_OR_DEPARTMENT_OTHER)

## 2023-12-05 ENCOUNTER — Ambulatory Visit (HOSPITAL_BASED_OUTPATIENT_CLINIC_OR_DEPARTMENT_OTHER): Admitting: Obstetrics & Gynecology

## 2023-12-05 VITALS — BP 134/87 | HR 91 | Ht 65.0 in | Wt 236.0 lb

## 2023-12-05 DIAGNOSIS — D271 Benign neoplasm of left ovary: Secondary | ICD-10-CM | POA: Diagnosis not present

## 2023-12-05 DIAGNOSIS — R5383 Other fatigue: Secondary | ICD-10-CM | POA: Diagnosis not present

## 2023-12-05 DIAGNOSIS — Z90721 Acquired absence of ovaries, unilateral: Secondary | ICD-10-CM | POA: Diagnosis not present

## 2023-12-05 DIAGNOSIS — Z86012 Personal history of benign carcinoid tumor: Secondary | ICD-10-CM | POA: Diagnosis not present

## 2023-12-05 DIAGNOSIS — E282 Polycystic ovarian syndrome: Secondary | ICD-10-CM

## 2023-12-05 NOTE — Progress Notes (Signed)
 GYNECOLOGY  VISIT  CC:   Discuss ultrasound results  HPI: 38 y.o. G61P3002 Married White or Caucasian female here for discussion of ultrasound results.  Ultrasound obtained due to h/o prior left dermoid resection and h/o RSO now with new symptoms that are similar to when carcinoid was removed along with dermoid in 2015.    Ultrasound showed uterus measuring 9.5 x 4.6 x 4.9cm.  Endometrium was 10.12mm. Right ovary surgically absent.  Left ovary 2.7 x 2.5 x 1.8c with small follicles noted but no evidence of dermoid.   Symptoms reviewed.  Will check b 12, CBC, TSH and CMP today.  Discussed repeat 24 urine for serotonin level.  She would like to do this as symptoms seem so similar to what she experience prior to first surgery when carcinoid was removed.  She did do a 24 hour urine after the surgery but carcinoid was revolved by that point.     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 Formatting of this note might be different from the original.  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  0.25 cm carci   Depression    Family history of breast cancer    Family history of prostate cancer    Gestational diabetes 09/16/2019   Infertility, female    clomid    Migraines    Ovarian cyst    dermoid    PCOS (polycystic ovarian syndrome)    Stomach problems    (migraines or spasms) since age 27     MEDS:   Current Outpatient Medications on File Prior to Visit  Medication Sig Dispense Refill   FLUoxetine  (PROZAC ) 20 MG capsule Take 20 mg by mouth daily.     No current facility-administered medications on file prior to visit.    ALLERGIES: Augmentin [amoxicillin-pot  clavulanate] and Penicillins  SH:  married, non smoker  Review of Systems  Constitutional:  Positive for malaise/fatigue.    PHYSICAL EXAMINATION:    BP 134/87 (BP Location: Right Arm, Patient Position: Sitting, Cuff Size: Normal)   Pulse 91   Ht 5\' 5"  (1.651 m)   Wt 236 lb (107 kg)   BMI 39.27 kg/m      Physical Exam Constitutional:      Appearance: Normal appearance.  Neurological:     General: No focal deficit present.  Psychiatric:        Mood and Affect: Mood normal.     Assessment/Plan: 1. Other fatigue (Primary) - lab work as per below ordered today - Vitamin B12 - CBC with Differential/Platelet - TSH Rfx on Abnormal to Free T4 - Comprehensive metabolic panel with GFR  2. History of benign carcinoid tumor - 5 HIAA, quantitative, urine, 24 hour; Future

## 2023-12-06 ENCOUNTER — Encounter (HOSPITAL_BASED_OUTPATIENT_CLINIC_OR_DEPARTMENT_OTHER): Payer: Self-pay | Admitting: Obstetrics & Gynecology

## 2023-12-06 LAB — CBC WITH DIFFERENTIAL/PLATELET
Basophils Absolute: 0 10*3/uL (ref 0.0–0.2)
Basos: 0 %
EOS (ABSOLUTE): 0.1 10*3/uL (ref 0.0–0.4)
Eos: 1 %
Hematocrit: 41.8 % (ref 34.0–46.6)
Hemoglobin: 13.2 g/dL (ref 11.1–15.9)
Immature Grans (Abs): 0 10*3/uL (ref 0.0–0.1)
Immature Granulocytes: 0 %
Lymphocytes Absolute: 3.9 10*3/uL — ABNORMAL HIGH (ref 0.7–3.1)
Lymphs: 35 %
MCH: 27.3 pg (ref 26.6–33.0)
MCHC: 31.6 g/dL (ref 31.5–35.7)
MCV: 86 fL (ref 79–97)
Monocytes Absolute: 0.5 10*3/uL (ref 0.1–0.9)
Monocytes: 5 %
Neutrophils Absolute: 6.4 10*3/uL (ref 1.4–7.0)
Neutrophils: 59 %
Platelets: 274 10*3/uL (ref 150–450)
RBC: 4.84 x10E6/uL (ref 3.77–5.28)
RDW: 12.7 % (ref 11.7–15.4)
WBC: 11 10*3/uL — ABNORMAL HIGH (ref 3.4–10.8)

## 2023-12-06 LAB — COMPREHENSIVE METABOLIC PANEL WITH GFR
ALT: 19 IU/L (ref 0–32)
AST: 15 IU/L (ref 0–40)
Albumin: 4.2 g/dL (ref 3.9–4.9)
Alkaline Phosphatase: 56 IU/L (ref 44–121)
BUN/Creatinine Ratio: 19 (ref 9–23)
BUN: 14 mg/dL (ref 6–20)
Bilirubin Total: <0.2 mg/dL (ref 0.0–1.2)
CO2: 24 mmol/L (ref 20–29)
Calcium: 9.8 mg/dL (ref 8.7–10.2)
Chloride: 102 mmol/L (ref 96–106)
Creatinine, Ser: 0.73 mg/dL (ref 0.57–1.00)
Globulin, Total: 2.5 g/dL (ref 1.5–4.5)
Glucose: 127 mg/dL — ABNORMAL HIGH (ref 70–99)
Potassium: 4.2 mmol/L (ref 3.5–5.2)
Sodium: 140 mmol/L (ref 134–144)
Total Protein: 6.7 g/dL (ref 6.0–8.5)
eGFR: 108 mL/min/{1.73_m2} (ref >59)

## 2023-12-06 LAB — VITAMIN B12: Vitamin B-12: >2000 pg/mL — ABNORMAL HIGH (ref 232–1245)

## 2023-12-06 LAB — TSH RFX ON ABNORMAL TO FREE T4: TSH: 1.05 u[IU]/mL (ref 0.450–4.500)

## 2023-12-07 ENCOUNTER — Other Ambulatory Visit (HOSPITAL_BASED_OUTPATIENT_CLINIC_OR_DEPARTMENT_OTHER): Payer: Self-pay | Admitting: Obstetrics & Gynecology

## 2023-12-07 ENCOUNTER — Encounter (HOSPITAL_BASED_OUTPATIENT_CLINIC_OR_DEPARTMENT_OTHER): Payer: Self-pay | Admitting: Obstetrics & Gynecology

## 2023-12-07 DIAGNOSIS — D72828 Other elevated white blood cell count: Secondary | ICD-10-CM

## 2023-12-07 DIAGNOSIS — R899 Unspecified abnormal finding in specimens from other organs, systems and tissues: Secondary | ICD-10-CM

## 2023-12-27 ENCOUNTER — Other Ambulatory Visit (HOSPITAL_BASED_OUTPATIENT_CLINIC_OR_DEPARTMENT_OTHER): Payer: Self-pay

## 2023-12-27 DIAGNOSIS — Z86012 Personal history of benign carcinoid tumor: Secondary | ICD-10-CM

## 2023-12-31 LAB — 5 HIAA, QUANTITATIVE, URINE, 24 HOUR

## 2024-01-02 ENCOUNTER — Other Ambulatory Visit (HOSPITAL_BASED_OUTPATIENT_CLINIC_OR_DEPARTMENT_OTHER): Payer: Self-pay | Admitting: Obstetrics & Gynecology

## 2024-01-02 ENCOUNTER — Other Ambulatory Visit (HOSPITAL_COMMUNITY): Payer: Self-pay | Admitting: Physician Assistant

## 2024-01-02 ENCOUNTER — Other Ambulatory Visit (HOSPITAL_BASED_OUTPATIENT_CLINIC_OR_DEPARTMENT_OTHER): Payer: Self-pay

## 2024-01-02 ENCOUNTER — Ambulatory Visit (HOSPITAL_BASED_OUTPATIENT_CLINIC_OR_DEPARTMENT_OTHER): Payer: Self-pay | Admitting: Obstetrics & Gynecology

## 2024-01-02 ENCOUNTER — Encounter (HOSPITAL_BASED_OUTPATIENT_CLINIC_OR_DEPARTMENT_OTHER): Payer: Self-pay | Admitting: Obstetrics & Gynecology

## 2024-01-02 DIAGNOSIS — D72828 Other elevated white blood cell count: Secondary | ICD-10-CM

## 2024-01-02 DIAGNOSIS — Z86012 Personal history of benign carcinoid tumor: Secondary | ICD-10-CM

## 2024-01-02 DIAGNOSIS — R899 Unspecified abnormal finding in specimens from other organs, systems and tissues: Secondary | ICD-10-CM

## 2024-01-02 LAB — CBC WITH DIFFERENTIAL/PLATELET
Basophils Absolute: 0 10*3/uL (ref 0.0–0.2)
Basos: 0 %
EOS (ABSOLUTE): 0.1 10*3/uL (ref 0.0–0.4)
Eos: 1 %
Hematocrit: 42.1 % (ref 34.0–46.6)
Hemoglobin: 13.8 g/dL (ref 11.1–15.9)
Immature Grans (Abs): 0 10*3/uL (ref 0.0–0.1)
Immature Granulocytes: 0 %
Lymphocytes Absolute: 3.2 10*3/uL — ABNORMAL HIGH (ref 0.7–3.1)
Lymphs: 33 %
MCH: 28.3 pg (ref 26.6–33.0)
MCHC: 32.8 g/dL (ref 31.5–35.7)
MCV: 86 fL (ref 79–97)
Monocytes Absolute: 0.5 10*3/uL (ref 0.1–0.9)
Monocytes: 5 %
Neutrophils Absolute: 5.8 10*3/uL (ref 1.4–7.0)
Neutrophils: 61 %
Platelets: 274 10*3/uL (ref 150–450)
RBC: 4.87 x10E6/uL (ref 3.77–5.28)
RDW: 12.6 % (ref 11.7–15.4)
WBC: 9.6 10*3/uL (ref 3.4–10.8)

## 2024-01-03 LAB — VITAMIN B12: Vitamin B-12: >2000 pg/mL — ABNORMAL HIGH (ref 232–1245)

## 2024-01-04 ENCOUNTER — Ambulatory Visit (HOSPITAL_BASED_OUTPATIENT_CLINIC_OR_DEPARTMENT_OTHER): Payer: Self-pay | Admitting: Obstetrics & Gynecology

## 2024-01-08 ENCOUNTER — Other Ambulatory Visit (HOSPITAL_BASED_OUTPATIENT_CLINIC_OR_DEPARTMENT_OTHER): Payer: Self-pay | Admitting: Obstetrics & Gynecology

## 2024-07-09 ENCOUNTER — Encounter (HOSPITAL_BASED_OUTPATIENT_CLINIC_OR_DEPARTMENT_OTHER): Payer: Self-pay | Admitting: Obstetrics & Gynecology

## 2024-08-26 NOTE — Progress Notes (Unsigned)
" ° °  ANNUAL EXAM Patient name: [name] MRN [id number]  Date of birth: [birth year] Chief Complaint:   No chief complaint on file.  History of Present Illness:   [name] is a 39 y.o. G16P3002 Caucasian female being seen today for a routine annual exam.  Current complaints: ***  No LMP recorded.   The pregnancy intention screening data noted above was reviewed. Potential methods of contraception were discussed. The patient elected to proceed with No data recorded.   Last pap 07/06/2021. Results were: NILM w/ HRHPV negative. H/O abnormal pap: {yes/yes***/no:23866} Last mammogram: 12/11/2022. Results were: normal. Family h/o breast cancer: {yes***/no:23838} Last colonoscopy: Family h/o colorectal cancer: {yes***/no:23838}     08/06/2023    9:19 AM 11/16/2022    1:34 PM 07/11/2022    9:03 AM 08/17/2021   10:50 AM 07/06/2021    9:11 AM  Depression screen PHQ 2/9  Decreased Interest 0 0 0 0 0  Down, Depressed, Hopeless 0 0 0 0 0  PHQ - 2 Score 0 0 0 0 0         No data to display           Review of Systems:   Pertinent items are noted in HPI Denies any headaches, blurred vision, fatigue, shortness of breath, chest pain, abdominal pain, abnormal vaginal discharge/itching/odor/irritation, problems with periods, bowel movements, urination, or intercourse unless otherwise stated above. Pertinent History Reviewed:  Reviewed past medical,surgical, social and family history.  Reviewed problem list, medications and allergies. Physical Assessment:  There were no vitals filed for this visit.There is no height or weight on file to calculate BMI.        Physical Examination:   General appearance - well appearing, and in no distress  Mental status - alert, oriented to person, place, and time  Psych:  She has a normal mood and affect  Skin - warm and dry, normal color, no suspicious lesions noted  Chest - effort normal, all lung fields clear to auscultation  bilaterally  Heart - normal rate and regular rhythm  Neck:  midline trachea, no thyromegaly or nodules  Breasts - breasts appear normal, no suspicious masses, no skin or nipple changes or  axillary nodes  Abdomen - soft, nontender, nondistended, no masses or organomegaly  Pelvic - VULVA: normal appearing vulva with no masses, tenderness or lesions  VAGINA: normal appearing vagina with normal color and discharge, no lesions  CERVIX: normal appearing cervix without discharge or lesions, no CMT  Thin prep pap is {Desc; done/not:10129} *** HR HPV cotesting  UTERUS: uterus is felt to be normal size, shape, consistency and nontender   ADNEXA: No adnexal masses or tenderness noted.  Rectal - normal rectal, good sphincter tone, no masses felt. Hemoccult: ***  Extremities:  No swelling or varicosities noted  Chaperone present for exam  No results found for this or any previous visit (from the past 24 hours).  Assessment & Plan:  1) Well-Woman Exam  2) ***  Labs/procedures today: ***  Mammogram: {Mammo f/u:25212::@ 40yo}, or sooner if problems Colonoscopy: {TCS f/u:25213::@ 39yo}, or sooner if problems  No orders of the defined types were placed in this encounter.   Meds: No orders of the defined types were placed in this encounter.   Follow-up: No follow-ups on file.  Melinda LOISE Quale, RN 08/26/2024 3:29 PM "

## 2024-08-27 ENCOUNTER — Encounter (HOSPITAL_BASED_OUTPATIENT_CLINIC_OR_DEPARTMENT_OTHER): Payer: Self-pay | Admitting: Obstetrics & Gynecology

## 2024-08-27 ENCOUNTER — Ambulatory Visit (INDEPENDENT_AMBULATORY_CARE_PROVIDER_SITE_OTHER): Admitting: Obstetrics & Gynecology

## 2024-08-27 ENCOUNTER — Other Ambulatory Visit (HOSPITAL_COMMUNITY)
Admission: RE | Admit: 2024-08-27 | Discharge: 2024-08-27 | Disposition: A | Source: Ambulatory Visit | Attending: Obstetrics & Gynecology | Admitting: Obstetrics & Gynecology

## 2024-08-27 VITALS — BP 110/86 | HR 86 | Ht 64.0 in | Wt 239.0 lb

## 2024-08-27 DIAGNOSIS — R899 Unspecified abnormal finding in specimens from other organs, systems and tissues: Secondary | ICD-10-CM

## 2024-08-27 DIAGNOSIS — Z8742 Personal history of other diseases of the female genital tract: Secondary | ICD-10-CM | POA: Diagnosis not present

## 2024-08-27 DIAGNOSIS — Z124 Encounter for screening for malignant neoplasm of cervix: Secondary | ICD-10-CM | POA: Insufficient documentation

## 2024-08-27 DIAGNOSIS — Z1331 Encounter for screening for depression: Secondary | ICD-10-CM

## 2024-08-27 DIAGNOSIS — Z1151 Encounter for screening for human papillomavirus (HPV): Secondary | ICD-10-CM | POA: Diagnosis not present

## 2024-08-27 DIAGNOSIS — R799 Abnormal finding of blood chemistry, unspecified: Secondary | ICD-10-CM | POA: Diagnosis not present

## 2024-08-27 DIAGNOSIS — Z803 Family history of malignant neoplasm of breast: Secondary | ICD-10-CM

## 2024-08-27 DIAGNOSIS — D3A098 Benign carcinoid tumors of other sites: Secondary | ICD-10-CM

## 2024-08-27 DIAGNOSIS — D271 Benign neoplasm of left ovary: Secondary | ICD-10-CM

## 2024-08-27 DIAGNOSIS — Z01419 Encounter for gynecological examination (general) (routine) without abnormal findings: Secondary | ICD-10-CM | POA: Diagnosis not present

## 2024-08-27 DIAGNOSIS — E282 Polycystic ovarian syndrome: Secondary | ICD-10-CM | POA: Diagnosis not present

## 2024-08-28 LAB — VITAMIN B12: Vitamin B-12: 762 pg/mL (ref 232–1245)

## 2024-08-28 LAB — DHEA-SULFATE: DHEA-SO4: 328 ug/dL — ABNORMAL HIGH (ref 57.3–279.2)

## 2024-09-02 ENCOUNTER — Ambulatory Visit (HOSPITAL_BASED_OUTPATIENT_CLINIC_OR_DEPARTMENT_OTHER): Payer: Self-pay | Admitting: Obstetrics & Gynecology

## 2024-09-02 LAB — CYTOLOGY - PAP
Comment: NEGATIVE
Diagnosis: NEGATIVE
High risk HPV: NEGATIVE
